# Patient Record
Sex: Male | Born: 1965 | Race: White | Hispanic: No | Marital: Married | Smoking: Former smoker
Health system: Southern US, Community
[De-identification: ages and names within clinical notes are randomized; demographics above are authoritative.]

## PROBLEM LIST (undated history)

## (undated) DIAGNOSIS — K219 Gastro-esophageal reflux disease without esophagitis: Secondary | ICD-10-CM

## (undated) DIAGNOSIS — E119 Type 2 diabetes mellitus without complications: Secondary | ICD-10-CM

## (undated) DIAGNOSIS — F329 Major depressive disorder, single episode, unspecified: Secondary | ICD-10-CM

## (undated) DIAGNOSIS — E079 Disorder of thyroid, unspecified: Secondary | ICD-10-CM

## (undated) DIAGNOSIS — N451 Epididymitis: Secondary | ICD-10-CM

## (undated) DIAGNOSIS — N529 Male erectile dysfunction, unspecified: Secondary | ICD-10-CM

## (undated) DIAGNOSIS — G43909 Migraine, unspecified, not intractable, without status migrainosus: Secondary | ICD-10-CM

## (undated) DIAGNOSIS — I4510 Unspecified right bundle-branch block: Secondary | ICD-10-CM

## (undated) DIAGNOSIS — G4730 Sleep apnea, unspecified: Secondary | ICD-10-CM

## (undated) DIAGNOSIS — F32A Depression, unspecified: Secondary | ICD-10-CM

## (undated) DIAGNOSIS — F419 Anxiety disorder, unspecified: Secondary | ICD-10-CM

## (undated) DIAGNOSIS — Z5189 Encounter for other specified aftercare: Secondary | ICD-10-CM

## (undated) DIAGNOSIS — M1990 Unspecified osteoarthritis, unspecified site: Secondary | ICD-10-CM

## (undated) DIAGNOSIS — G4733 Obstructive sleep apnea (adult) (pediatric): Secondary | ICD-10-CM

## (undated) DIAGNOSIS — E785 Hyperlipidemia, unspecified: Secondary | ICD-10-CM

## (undated) DIAGNOSIS — N411 Chronic prostatitis: Secondary | ICD-10-CM

## (undated) DIAGNOSIS — IMO0002 Reserved for concepts with insufficient information to code with codable children: Secondary | ICD-10-CM

## (undated) DIAGNOSIS — K5900 Constipation, unspecified: Secondary | ICD-10-CM

## (undated) HISTORY — DX: Anxiety disorder, unspecified: F41.9

## (undated) HISTORY — DX: Epididymitis: N45.1

## (undated) HISTORY — DX: Obstructive sleep apnea (adult) (pediatric): G47.33

## (undated) HISTORY — DX: Encounter for other specified aftercare: Z51.89

## (undated) HISTORY — PX: HERNIA REPAIR: SHX51

## (undated) HISTORY — DX: Major depressive disorder, single episode, unspecified: F32.9

## (undated) HISTORY — DX: Unspecified right bundle-branch block: I45.10

## (undated) HISTORY — DX: Disorder of thyroid, unspecified: E07.9

## (undated) HISTORY — DX: Type 2 diabetes mellitus without complications: E11.9

## (undated) HISTORY — DX: Gastro-esophageal reflux disease without esophagitis: K21.9

## (undated) HISTORY — DX: Constipation, unspecified: K59.00

## (undated) HISTORY — DX: Migraine, unspecified, not intractable, without status migrainosus: G43.909

## (undated) HISTORY — DX: Depression, unspecified: F32.A

## (undated) HISTORY — DX: Unspecified osteoarthritis, unspecified site: M19.90

## (undated) HISTORY — DX: Chronic prostatitis: N41.1

## (undated) HISTORY — DX: Sleep apnea, unspecified: G47.30

## (undated) HISTORY — DX: Hyperlipidemia, unspecified: E78.5

## (undated) HISTORY — DX: Reserved for concepts with insufficient information to code with codable children: IMO0002

## (undated) HISTORY — PX: ELBOW SURGERY: SHX618

## (undated) HISTORY — DX: Male erectile dysfunction, unspecified: N52.9

---

## 1999-01-31 HISTORY — PX: COLONOSCOPY: SHX174

## 2001-08-30 HISTORY — PX: UVULOPALATOPHARYNGOPLASTY: SHX827

## 2005-09-30 HISTORY — PX: SHOULDER ARTHROSCOPY: SHX128

## 2006-01-30 HISTORY — PX: SHOULDER ARTHROSCOPY: SHX128

## 2009-01-30 HISTORY — PX: UMBILICAL HERNIA REPAIR: SHX196

## 2009-02-10 ENCOUNTER — Ambulatory Visit (HOSPITAL_COMMUNITY): Admission: RE | Admit: 2009-02-10 | Discharge: 2009-02-10 | Payer: Self-pay | Admitting: Surgery

## 2009-03-22 ENCOUNTER — Emergency Department (HOSPITAL_COMMUNITY): Admission: EM | Admit: 2009-03-22 | Discharge: 2009-03-22 | Payer: Self-pay | Admitting: Emergency Medicine

## 2010-04-17 LAB — CBC
RBC: 4.95 MIL/uL (ref 4.22–5.81)
WBC: 6.6 10*3/uL (ref 4.0–10.5)

## 2010-04-17 LAB — COMPREHENSIVE METABOLIC PANEL
ALT: 32 U/L (ref 0–53)
AST: 24 U/L (ref 0–37)
Alkaline Phosphatase: 76 U/L (ref 39–117)
CO2: 28 mEq/L (ref 19–32)
Chloride: 102 mEq/L (ref 96–112)
GFR calc Af Amer: >60 mL/min (ref >60)
GFR calc non Af Amer: >60 mL/min (ref >60)
Sodium: 138 mEq/L (ref 135–145)
Total Bilirubin: 0.4 mg/dL (ref 0.3–1.2)

## 2010-04-17 LAB — DIFFERENTIAL
Basophils Absolute: 0 10*3/uL (ref 0.0–0.1)
Eosinophils Absolute: 0.1 10*3/uL (ref 0.0–0.7)
Eosinophils Relative: 1 % (ref 0–5)
Lymphs Abs: 2 10*3/uL (ref 0.7–4.0)

## 2010-09-01 ENCOUNTER — Encounter: Payer: Self-pay | Admitting: Family Medicine

## 2010-09-01 DIAGNOSIS — E785 Hyperlipidemia, unspecified: Secondary | ICD-10-CM | POA: Insufficient documentation

## 2010-09-01 DIAGNOSIS — N411 Chronic prostatitis: Secondary | ICD-10-CM | POA: Insufficient documentation

## 2010-09-01 DIAGNOSIS — K219 Gastro-esophageal reflux disease without esophagitis: Secondary | ICD-10-CM | POA: Insufficient documentation

## 2011-12-14 ENCOUNTER — Other Ambulatory Visit (HOSPITAL_COMMUNITY): Payer: Self-pay

## 2011-12-14 DIAGNOSIS — M25522 Pain in left elbow: Secondary | ICD-10-CM

## 2011-12-14 DIAGNOSIS — I059 Rheumatic mitral valve disease, unspecified: Secondary | ICD-10-CM

## 2011-12-14 DIAGNOSIS — G8929 Other chronic pain: Secondary | ICD-10-CM

## 2011-12-14 DIAGNOSIS — M7710 Lateral epicondylitis, unspecified elbow: Secondary | ICD-10-CM

## 2011-12-19 ENCOUNTER — Ambulatory Visit (HOSPITAL_COMMUNITY)
Admission: RE | Admit: 2011-12-19 | Discharge: 2011-12-19 | Disposition: A | Discharge status: Home / Self Care | Source: Ambulatory Visit | Attending: Cardiovascular Disease | Admitting: Cardiovascular Disease

## 2011-12-19 DIAGNOSIS — R011 Cardiac murmur, unspecified: Secondary | ICD-10-CM | POA: Insufficient documentation

## 2011-12-19 DIAGNOSIS — I059 Rheumatic mitral valve disease, unspecified: Secondary | ICD-10-CM

## 2011-12-19 NOTE — Progress Notes (Signed)
[facility]   2D echo completed 12/19/2011.   Cindy Danea Manter, RDCS   

## 2012-02-01 DIAGNOSIS — G4733 Obstructive sleep apnea (adult) (pediatric): Secondary | ICD-10-CM

## 2012-02-01 HISTORY — DX: Obstructive sleep apnea (adult) (pediatric): G47.33

## 2012-02-15 ENCOUNTER — Encounter: Payer: Self-pay | Admitting: *Deleted

## 2012-02-15 DIAGNOSIS — N529 Male erectile dysfunction, unspecified: Secondary | ICD-10-CM | POA: Insufficient documentation

## 2012-02-15 DIAGNOSIS — F419 Anxiety disorder, unspecified: Secondary | ICD-10-CM | POA: Insufficient documentation

## 2012-02-15 DIAGNOSIS — N451 Epididymitis: Secondary | ICD-10-CM | POA: Insufficient documentation

## 2012-02-15 DIAGNOSIS — G43909 Migraine, unspecified, not intractable, without status migrainosus: Secondary | ICD-10-CM | POA: Insufficient documentation

## 2012-02-15 DIAGNOSIS — G4733 Obstructive sleep apnea (adult) (pediatric): Secondary | ICD-10-CM | POA: Insufficient documentation

## 2012-05-01 ENCOUNTER — Telehealth: Payer: Self-pay | Admitting: Family Medicine

## 2012-05-01 MED ORDER — ESCITALOPRAM OXALATE 20 MG PO TABS: 20.0000 mg | ORAL_TABLET | Freq: Every day | ORAL | Status: DC

## 2012-05-01 NOTE — Telephone Encounter (Signed)
rx refilled.

## 2012-06-03 ENCOUNTER — Encounter: Payer: Self-pay | Admitting: Family Medicine

## 2012-06-03 ENCOUNTER — Ambulatory Visit (INDEPENDENT_AMBULATORY_CARE_PROVIDER_SITE_OTHER): Admitting: Family Medicine

## 2012-06-03 VITALS — BP 118/68 | HR 54 | Temp 97.8°F | Resp 16 | Wt 223.0 lb

## 2012-06-03 DIAGNOSIS — M25529 Pain in unspecified elbow: Secondary | ICD-10-CM

## 2012-06-03 DIAGNOSIS — M7712 Lateral epicondylitis, left elbow: Secondary | ICD-10-CM

## 2012-06-03 DIAGNOSIS — G8929 Other chronic pain: Secondary | ICD-10-CM

## 2012-06-03 DIAGNOSIS — M7710 Lateral epicondylitis, unspecified elbow: Secondary | ICD-10-CM

## 2012-06-03 MED ORDER — PREDNISONE 20 MG PO TABS: ORAL_TABLET | ORAL | Status: DC

## 2012-06-03 NOTE — Progress Notes (Signed)
Subjective:    Patient ID: Mike Henderson, male    DOB: 30-Jul-1965, 47 y.o.   MRN: [id number]  HPI  Patient presents with 4 months of pain in his left elbow. He saw his orthopedist in January. That time he was given a Cortizone injection around the left lateral epicondyles and was given instructions on how to use an elbow strap. He stated that this helped the pain for over a month. However the pain gradually returned and exquisitely worsened 3 weeks ago. He felt a pop while he was digging with a shovel. He now has extreme pain over the left lateral epicondyles with dorsiflexion of the wrist, gentle touch, or wiggling the fingers. He is also having numbness and tingling in his fourth and fifth finger on his left  Past Medical History  Diagnosis Date  . Hyperlipidemia   . OSA (obstructive sleep apnea)   . Depression   . ED (erectile dysfunction)   . Chronic prostatitis   . Epididymitis   . Migraines   . Anxiety     Panic attacks  . GERD (gastroesophageal reflux disease)    Current Outpatient Prescriptions on File Prior to Visit  Medication Sig Dispense Refill  . ALPRAZolam (XANAX) 0.5 MG tablet Take 0.5 mg by mouth at bedtime as needed.        Marland Kitchen escitalopram (LEXAPRO) 20 MG tablet Take 1 tablet (20 mg total) by mouth daily.  30 tablet  5  . lansoprazole (PREVACID) 30 MG capsule Take 30 mg by mouth daily.        Marland Kitchen losartan (COZAAR) 25 MG tablet Take 25 mg by mouth daily.      . simvastatin (ZOCOR) 20 MG tablet Take 20 mg by mouth at bedtime.        . vardenafil (LEVITRA) 20 MG tablet Take 20 mg by mouth daily as needed.         No current facility-administered medications on file prior to visit.   No Known Allergies History   Social History  . Marital Status: Single    Spouse Name: N/A    Number of Children: N/A  . Years of Education: N/A   Occupational History  . Not on file.   Social History Main Topics  . Smoking status: Never Smoker   . Smokeless tobacco: Not on  file  . Alcohol Use: 1.0 oz/week    2 drink(s) per week  . Drug Use: No  . Sexually Active: Not on file   Other Topics Concern  . Not on file   Social History Narrative  . No narrative on file    Review of Systems Review of systems is negative    Objective:   Physical Exam  Cardiovascular: Normal rate, regular rhythm and normal heart sounds.   Pulmonary/Chest: Effort normal and breath sounds normal. No respiratory distress. He has no wheezes.  Musculoskeletal:       Left elbow: He exhibits no swelling, no effusion and no laceration. Tenderness found. Lateral epicondyle tenderness noted. No radial head and no olecranon process tenderness noted.   he has extreme tenderness to palpation of the left lateral epicondyles. This is worsened with wrist dorsiflexion. It is worse with gripping objects with his left hand. It is worse with flexing and extending his PIP and DIP joints against resistance.        Assessment & Plan:  1. Epicondylitis, lateral (tennis elbow), left I am concerned about partial tear in the extensor tendon group.  Recommended  he continue wearing his elbow strap. Also prescribed prednisone 20 mg tablets. He is to take 3 tablets on day 1 and day 2. 2 tablets on day 3 and afebrile. 1 tablet on day 5 and a 6. - MR Elbow Left W Wo Contrast; Future  2. Elbow pain, chronic, left See problem #1. - MR Elbow Left W Wo Contrast; Future

## 2012-06-13 ENCOUNTER — Encounter: Payer: Self-pay | Admitting: Family Medicine

## 2012-06-13 NOTE — Progress Notes (Unsigned)
Patient ID: Mike Henderson, male   DOB: January 03, 1966, 47 y.o.   MRN: [id number] Pt has appt with [facility] on May 19 at 1045 for MRI of Left elbow, pt is aware

## 2012-06-21 ENCOUNTER — Other Ambulatory Visit: Payer: Self-pay | Admitting: Family Medicine

## 2012-06-21 DIAGNOSIS — M25522 Pain in left elbow: Secondary | ICD-10-CM

## 2012-06-21 DIAGNOSIS — M7710 Lateral epicondylitis, unspecified elbow: Secondary | ICD-10-CM

## 2012-06-21 DIAGNOSIS — G8929 Other chronic pain: Secondary | ICD-10-CM

## 2012-06-23 ENCOUNTER — Encounter: Payer: Self-pay | Admitting: *Deleted

## 2012-06-26 ENCOUNTER — Encounter: Payer: Self-pay | Admitting: Cardiovascular Disease

## 2012-06-27 ENCOUNTER — Ambulatory Visit (INDEPENDENT_AMBULATORY_CARE_PROVIDER_SITE_OTHER): Admitting: Cardiovascular Disease

## 2012-06-27 ENCOUNTER — Encounter: Payer: Self-pay | Admitting: Cardiovascular Disease

## 2012-06-27 VITALS — BP 122/70 | HR 72 | Ht 71.0 in | Wt 220.4 lb

## 2012-06-27 DIAGNOSIS — I4510 Unspecified right bundle-branch block: Secondary | ICD-10-CM

## 2012-06-27 DIAGNOSIS — E669 Obesity, unspecified: Secondary | ICD-10-CM

## 2012-06-27 DIAGNOSIS — G4733 Obstructive sleep apnea (adult) (pediatric): Secondary | ICD-10-CM

## 2012-06-27 DIAGNOSIS — E785 Hyperlipidemia, unspecified: Secondary | ICD-10-CM

## 2012-06-27 NOTE — Patient Instructions (Addendum)
Your physician recommends that you schedule a follow-up appointment in as needed for your sleep theraphy.

## 2012-06-28 ENCOUNTER — Encounter: Payer: Self-pay | Admitting: Cardiovascular Disease

## 2012-06-28 DIAGNOSIS — E669 Obesity, unspecified: Secondary | ICD-10-CM | POA: Insufficient documentation

## 2012-06-28 DIAGNOSIS — I4510 Unspecified right bundle-branch block: Secondary | ICD-10-CM | POA: Insufficient documentation

## 2012-06-28 NOTE — Progress Notes (Signed)
Patient ID: Mike Henderson, male   DOB: Mar 16, 1965, 47 y.o.   MRN: [id number] HPI: Mike Henderson, is a 47 y.o. male who presents to the office today for sleep clinic evaluation following initiation of CPAP therapy. Mike Henderson is a 47 year old Mike Henderson is retired from the [employer]. Apparently in 2003 while in [place] he was diagnosed as having obstructive sleep apnea he underwent a uvulopalatal pharyngeal plasty  after he was not able to she addition of CPAP therapy. He states this did improve his sleep pattern. In 2007 he had another sleep study done at the [facility] but apparently he was never notified of  the results and was told that the case was closed and no treatment was necessary. Recently, the patient has noticed progressive sleep issues: his sleep has become disruptive, he's noted frequent urination, morning and migraine headaches, increased blood pressure as well as fatigue. Result, he was referred for a sleep study on 02/01/2012. At that time, his Epworth scale endorsed 17 and his Beck's inventory score endorsed at 28. He was found to have mild sleep apnea overall with an AHI of 7.6 per hour and an RDI of 10.5 per air. However, during sleep, sleep apnea was severe with an HI of 37.3 per hour. He subsequently underwent a CPAP titration trial on 03/07/2012 and was titrated up to 12 cm water pressure with excellent response. He has been on CPAP therapy since 04/18/2012 and a download inclusive of 06/16/2012 revealed excellent usage having used at 59/60 days, averaging  7 hours and 28 minutes per night at a 12 cm set pressure. His AHI is now excellent at 0.5. He presents now for evaluation. If additional problems also include hypertension, hyperlipidemia appear review of his chart the laboratory in July 2012 did suggest atherogenic dyslipidemia panel with a total cholesterol of 185 but markedly elevated triglycerides at 318, increased VLDL at 64 and low HDL levels at 38 and even though his LDL  was 83 he tells me he recently had laboratory done by Mike Henderson and was told that his labs were "okay."  Epworth Sleepiness Scale: Situation   Chance of Dozing/Sleeping (0 = never , 1 = slight chance , 2 = moderate chance , 3 = high chance )   sitting and reading 1   watching TV 0   sitting inactive in a public place 1   being a passenger in a motor vehicle for an hour or more 2   lying down in the afternoon 3   sitting and talking to someone 0   sitting quietly after lunch (no alcohol) 1   while stopped for a few minutes in traffic as the driver 0   Total Score  8    Past Medical History  Diagnosis Date  . Hyperlipidemia   . OSA (obstructive sleep apnea) 02/01/12    SLEEP STUDY [facility]  . Depression   . ED (erectile dysfunction)   . Chronic prostatitis   . Epididymitis   . Migraines   . Anxiety     Panic attacks  . GERD (gastroesophageal reflux disease)   . RBBB     Past Surgical History  Procedure Laterality Date  . Uvulopalatopharyngoplasty  08/2001  . Shoulder arthroscopy  09/2005    left  . Shoulder arthroscopy  01/2006    right  . Umbilical hernia repair  01/2009    No Known Allergies  Current Outpatient Prescriptions  Medication Sig Dispense Refill  .  ALPRAZolam (XANAX) 0.5 MG tablet Take 0.5 mg by mouth at bedtime as needed.        Marland Kitchen escitalopram (LEXAPRO) 20 MG tablet Take 1 tablet (20 mg total) by mouth daily.  30 tablet  5  . lansoprazole (PREVACID) 30 MG capsule Take 30 mg by mouth daily.        Marland Kitchen losartan (COZAAR) 25 MG tablet Take 25 mg by mouth daily.      . Multiple Vitamin (MULTIVITAMIN) tablet Take 1 tablet by mouth daily.      . simvastatin (ZOCOR) 20 MG tablet Take 20 mg by mouth at bedtime.        Marland Kitchen UNABLE TO FIND Med Name:CPAP THERAPHY      . vardenafil (LEVITRA) 20 MG tablet Take 20 mg by mouth daily as needed.         No current facility-administered medications for this visit.    SOCHX is notable in that he was  married early and divorced at 56. [employer] for 22 years. There is no tobacco use. He works for airway [employer] and remains active with his work.  ROS is negative for fever chills night sweats. He denies any significant recent weight loss but did admit to some weight gain following evening the [employer]. He has been wearing his CPAP mask difficulty. Of note, there is family history for sleep apnea with his father also having undergone a UPPP procedure. He notes marked improvement in his previous migraines. He also notes improvement in his blood pressure as well as fatigability. He denies a significant residual daytime sleepiness. His nocturnal urination has improved. His blood pressure has improved with CPAP treatment. He denies chest pressure. He denies nocturnal tachycardia palpitations. He denies significant edema. Other system review is negative.  PE BP 122/70  Pulse 72  Ht 5\' 11"  (1.803 m)  Wt 220 lb 6.4 oz (99.973 kg)  BMI 30.75 kg/m2  General: Alert, oriented, no distress.  HEENT: Normocephalic, atraumatic. Pupils round and reactive; sclera anicteric; Fundi without hemorrhages or exudates. Nose without nasal septal hypertrophy Mouth/Parynx benign; he is status post UPPP procedure Neck: No JVD, no carotid briuts Lungs: clear to ausculatation and percussion; no wheezing or rales Heart: RRR, s1 s2 normal with a systolic murmur Abdomen: Mildly obese with central adiposity. soft, nontender; no hepatosplenomehaly, BS+; abdominal aorta nontender and not dilated by palpation. Pulses 2+ Extremities: no clubbinbg cyanosis or edema, Homan's sign negative  Neurologic: grossly nonfocal    LABS:  BMET    Component Value Date/Time   NA 138 02/08/2009 1410   K 3.3* 02/08/2009 1410   CL 102 02/08/2009 1410   CO2 28 02/08/2009 1410   GLUCOSE 104* 02/08/2009 1410   BUN 10 02/08/2009 1410   CREATININE 0.79 02/08/2009 1410   CALCIUM 9.3 02/08/2009 1410   GFRNONAA >60 02/08/2009 1410   GFRAA  Value: >60         The eGFR has been calculated using the MDRD equation. This calculation has not been validated in all clinical situations. eGFR's persistently <60 mL/min signify possible Chronic Kidney Disease. 02/08/2009 1410     Hepatic Function Panel     Component Value Date/Time   PROT 7.6 02/08/2009 1410   ALBUMIN 4.2 02/08/2009 1410   AST 24 02/08/2009 1410   ALT 32 02/08/2009 1410   ALKPHOS 76 02/08/2009 1410   BILITOT 0.4 02/08/2009 1410     CBC    Component Value Date/Time   WBC 6.6 02/08/2009 1410  RBC 4.95 02/08/2009 1410   HGB 15.9 02/08/2009 1410   HCT 45.9 02/08/2009 1410   PLT 266 02/08/2009 1410   MCV 92.7 02/08/2009 1410   MCHC 34.6 02/08/2009 1410   RDW 12.4 02/08/2009 1410   LYMPHSABS 2.0 02/08/2009 1410   MONOABS 0.6 02/08/2009 1410   EOSABS 0.1 02/08/2009 1410   BASOSABS 0.0 02/08/2009 1410     BNP No results found for this basename: probnp    Lipid Panel  No results found for this basename: chol, trig, hdl, cholhdl, vldl, ldlcalc     RADIOLOGY: No results found.    ASSESSMENT AND PLAN: Resume, Mr. Anastasi is doing well with reference to his resumption of CPAP therapy for obstructive sleep apnea. He has noticed marked improvement in symptoms with resolution of significant urinary frequency, improvement in headaches as well his blood pressure response. His download since except inception of CPAP shows an excellent response within AHI now of 0.5. His Epworth Sleepiness Scale score has markedly improved from 17 to 8. I discussed with him the importance of additional weight loss and exercise prescription. I also discussed with him the lipid panel that you had done her bleeders ago which raises the possibility of metabolic syndrome, and it was an atherogenic dyslipidemia profile. I suggested that the next time he sees Mike Henderson and NMR lipoprotein albeit obtained to assess particle  size as well as particle number also this will be helpful to assess his insulin resistance score.  Discussed lifestyle modification adjustments ominously perspective, he is doing well with resumption of CPAP therapy. I will be available to see him on an as needed basis if problems arise.     Lennette Bihari, MD, [facility]  06/28/2012 9:03 AM

## 2012-07-09 ENCOUNTER — Other Ambulatory Visit: Payer: Self-pay | Admitting: Family Medicine

## 2012-09-24 ENCOUNTER — Other Ambulatory Visit: Payer: Self-pay | Admitting: Family Medicine

## 2012-10-01 ENCOUNTER — Ambulatory Visit (INDEPENDENT_AMBULATORY_CARE_PROVIDER_SITE_OTHER): Admitting: Family Medicine

## 2012-10-01 ENCOUNTER — Encounter: Payer: Self-pay | Admitting: Family Medicine

## 2012-10-01 VITALS — BP 116/60 | HR 82 | Temp 97.6°F | Resp 20 | Wt 219.0 lb

## 2012-10-01 DIAGNOSIS — M62830 Muscle spasm of back: Secondary | ICD-10-CM

## 2012-10-01 DIAGNOSIS — M5380 Other specified dorsopathies, site unspecified: Secondary | ICD-10-CM

## 2012-10-01 MED ORDER — ALPRAZOLAM 0.5 MG PO TABS: 0.5000 mg | ORAL_TABLET | Freq: Every evening | ORAL | Status: DC | PRN

## 2012-10-01 MED ORDER — CYCLOBENZAPRINE HCL 10 MG PO TABS: 10.0000 mg | ORAL_TABLET | Freq: Every evening | ORAL | Status: DC | PRN

## 2012-10-01 MED ORDER — TRAMADOL HCL 50 MG PO TABS: 50.0000 mg | ORAL_TABLET | Freq: Four times a day (QID) | ORAL | Status: DC | PRN

## 2012-10-01 NOTE — Progress Notes (Signed)
  Subjective:    Patient ID: Mike Henderson, male    DOB: 1965-11-06, 47 y.o.   MRN: [id number]  HPI  Back pain for past few weeks, no injury. He has pinpoint tenderness on the right lower aspect of his back. States that his spine is nontender. 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 He also lifts weights but does not remember any injury with exercise. He denies any radiation of pain denies any change in bowel or bladder denies any appears seizures in his lower extremities. He has not used any over-the-counter medications he does have history of peptic ulcer and severe GERD therefore does not take anti-inflammatories.  Asking for refill on his xanax, uses at bedtime for his GAD  Review of Systems  GEN- denies fatigue, fever, weight loss,weakness, recent illness GU- denies dysuria, hematuria, dribbling, incontinence MSK- + joint pain, muscle aches, injury Neuro- denies headache, dizziness, syncope, seizure activity      Objective:   Physical Exam  GEN-NAD, alert and oriented x 3 GU-neg CVA tenderness MSK- Spine NT, TTP with spasm right paraspinals, neg SLR, FROM HIP bilat, FROM back Neuro- normal tone, sensation intact, motor intact  And equal bilat LE, normal Gait      Assessment & Plan:

## 2012-10-01 NOTE — Patient Instructions (Signed)
Try heating pad or salon patch Use muscle relaxer Take ultram as needed If not improved after a week, call back, xray will be done Xanax refilled F/U as needed

## 2012-10-02 DIAGNOSIS — M62830 Muscle spasm of back: Secondary | ICD-10-CM | POA: Insufficient documentation

## 2012-10-02 NOTE — Assessment & Plan Note (Signed)
I think this is more back spasms. I will put him on Flexeril at bedtime. He also has Ultram for severe pain. Unfortunately we cannot use any anti-inflammatories due to his GI history. If he does not improve or his pain worsens then I will obtain plain x-ray of L-spine consider physical therapy

## 2012-10-30 ENCOUNTER — Encounter: Payer: Self-pay | Admitting: Family Medicine

## 2012-11-06 ENCOUNTER — Telehealth: Payer: Self-pay | Admitting: Cardiovascular Disease

## 2012-11-06 NOTE — Telephone Encounter (Signed)
Mailed patient records to home address 11/06/12. ST

## 2012-11-21 ENCOUNTER — Other Ambulatory Visit: Payer: Self-pay | Admitting: Family Medicine

## 2012-11-21 MED ORDER — LOSARTAN POTASSIUM 25 MG PO TABS: 25.0000 mg | ORAL_TABLET | Freq: Every day | ORAL | Status: DC

## 2012-11-21 NOTE — Telephone Encounter (Signed)
Meds refilled.

## 2012-12-05 ENCOUNTER — Other Ambulatory Visit: Payer: Self-pay

## 2012-12-23 ENCOUNTER — Other Ambulatory Visit: Payer: Self-pay | Admitting: Family Medicine

## 2012-12-24 ENCOUNTER — Encounter: Payer: Self-pay | Admitting: Family Medicine

## 2013-01-08 ENCOUNTER — Other Ambulatory Visit

## 2013-01-08 ENCOUNTER — Other Ambulatory Visit: Payer: Self-pay | Admitting: Family Medicine

## 2013-01-08 DIAGNOSIS — Z0000 Encounter for general adult medical examination without abnormal findings: Secondary | ICD-10-CM

## 2013-01-08 DIAGNOSIS — E785 Hyperlipidemia, unspecified: Secondary | ICD-10-CM

## 2013-01-08 DIAGNOSIS — Z79899 Other long term (current) drug therapy: Secondary | ICD-10-CM

## 2013-01-08 LAB — CBC WITH DIFFERENTIAL/PLATELET
Basophils Absolute: 0.1 10*3/uL (ref 0.0–0.1)
Eosinophils Relative: 2 % (ref 0–5)
Lymphocytes Relative: 32 % (ref 12–46)
MCV: 88.4 fL (ref 78.0–100.0)
Platelets: 274 10*3/uL (ref 150–400)
RDW: 13.3 % (ref 11.5–15.5)
WBC: 5.5 10*3/uL (ref 4.0–10.5)

## 2013-01-08 LAB — LIPID PANEL
LDL Cholesterol: 112 mg/dL — ABNORMAL HIGH (ref 0–99)
Total CHOL/HDL Ratio: 3.9 Ratio
VLDL: 29 mg/dL (ref 0–40)

## 2013-01-08 LAB — COMPREHENSIVE METABOLIC PANEL
ALT: 36 U/L (ref 0–53)
AST: 22 U/L (ref 0–37)
Alkaline Phosphatase: 83 U/L (ref 39–117)
Calcium: 9.9 mg/dL (ref 8.4–10.5)
Chloride: 102 mEq/L (ref 96–112)
Creat: 0.76 mg/dL (ref 0.50–1.35)
Total Bilirubin: 0.4 mg/dL (ref 0.3–1.2)

## 2013-01-08 LAB — TSH: TSH: 4.011 u[IU]/mL (ref 0.350–4.500)

## 2013-01-10 LAB — HEMOGLOBIN A1C: Hgb A1c MFr Bld: 5.5 % (ref <5.7)

## 2013-01-13 ENCOUNTER — Ambulatory Visit (INDEPENDENT_AMBULATORY_CARE_PROVIDER_SITE_OTHER): Admitting: Family Medicine

## 2013-01-13 ENCOUNTER — Encounter: Payer: Self-pay | Admitting: Family Medicine

## 2013-01-13 VITALS — BP 110/70 | HR 80 | Temp 97.4°F | Resp 16 | Ht 71.0 in | Wt 230.0 lb

## 2013-01-13 DIAGNOSIS — R7309 Other abnormal glucose: Secondary | ICD-10-CM

## 2013-01-13 DIAGNOSIS — I10 Essential (primary) hypertension: Secondary | ICD-10-CM

## 2013-01-13 DIAGNOSIS — E785 Hyperlipidemia, unspecified: Secondary | ICD-10-CM

## 2013-01-13 DIAGNOSIS — R739 Hyperglycemia, unspecified: Secondary | ICD-10-CM

## 2013-01-13 NOTE — Progress Notes (Signed)
Subjective:    Patient ID: Mike Henderson, male    DOB: 09-02-1965, 47 y.o.   MRN: [id number]  HPI Patient comes back for follow up of his hypertension and hyperlipidemia. He is currently taking losartan for hypertension. His blood pressure is 110/70. He is being very compliant with CPAP therapy. He wears it every night for his obstructive sleep apnea. Result pressure is much improved to 110/70 however does report some fatigue. He denies any chest pain shortness of breath or dyspnea on exertion. He is also taking simvastatin 20 mg by mouth daily. He denies any myalgia right quadrant pain. His most recent labwork as listed below. His cholesterol is acceptable, his fasting blood sugars are 124. I obtained a hemoglobin A1c which confirms a level of 5.5. Lab on 01/08/2013  Component Date Value Range Status  . WBC 01/08/2013 5.5  4.0 - 10.5 K/uL Final  . RBC 01/08/2013 5.00  4.22 - 5.81 MIL/uL Final  . Hemoglobin 01/08/2013 15.6  13.0 - 17.0 g/dL Final  . HCT 41/32/4401 44.2  39.0 - 52.0 % Final  . MCV 01/08/2013 88.4  78.0 - 100.0 fL Final  . MCH 01/08/2013 31.2  26.0 - 34.0 pg Final  . MCHC 01/08/2013 35.3  30.0 - 36.0 g/dL Final  . RDW 02/72/5366 13.3  11.5 - 15.5 % Final  . Platelets 01/08/2013 274  150 - 400 K/uL Final  . Neutrophils Relative % 01/08/2013 56  43 - 77 % Final  . Neutro Abs 01/08/2013 3.1  1.7 - 7.7 K/uL Final  . Lymphocytes Relative 01/08/2013 32  12 - 46 % Final  . Lymphs Abs 01/08/2013 1.8  0.7 - 4.0 K/uL Final  . Monocytes Relative 01/08/2013 9  3 - 12 % Final  . Monocytes Absolute 01/08/2013 0.5  0.1 - 1.0 K/uL Final  . Eosinophils Relative 01/08/2013 2  0 - 5 % Final  . Eosinophils Absolute 01/08/2013 0.1  0.0 - 0.7 K/uL Final  . Basophils Relative 01/08/2013 1  0 - 1 % Final  . Basophils Absolute 01/08/2013 0.1  0.0 - 0.1 K/uL Final  . Smear Review 01/08/2013 Criteria for review not met   Final  . Sodium 01/08/2013 137  135 - 145 mEq/L Final  . Potassium  01/08/2013 4.7  3.5 - 5.3 mEq/L Final  . Chloride 01/08/2013 102  96 - 112 mEq/L Final  . CO2 01/08/2013 28  19 - 32 mEq/L Final  . Glucose, Bld 01/08/2013 124* 70 - 99 mg/dL Final  . BUN 44/03/4740 12  6 - 23 mg/dL Final  . Creat 59/56/3875 0.76  0.50 - 1.35 mg/dL Final  . Total Bilirubin 01/08/2013 0.4  0.3 - 1.2 mg/dL Final  . Alkaline Phosphatase 01/08/2013 83  39 - 117 U/L Final  . AST 01/08/2013 22  0 - 37 U/L Final  . ALT 01/08/2013 36  0 - 53 U/L Final  . Total Protein 01/08/2013 7.0  6.0 - 8.3 g/dL Final  . Albumin 64/33/2951 4.6  3.5 - 5.2 g/dL Final  . Calcium 88/41/6606 9.9  8.4 - 10.5 mg/dL Final  . Cholesterol 30/16/0109 189  0 - 200 mg/dL Final   Comment: ATP III Classification:                                < 200        mg/dL        Desirable  200 - 239     mg/dL        Borderline High                               >= 240        mg/dL        High                             . Triglycerides 01/08/2013 146  <150 mg/dL Final  . HDL 16/10/9602 48  >39 mg/dL Final  . Total CHOL/HDL Ratio 01/08/2013 3.9   Final  . VLDL 01/08/2013 29  0 - 40 mg/dL Final  . LDL Cholesterol 01/08/2013 112* 0 - 99 mg/dL Final   Comment:                            Total Cholesterol/HDL Ratio:CHD Risk                                                 Coronary Heart Disease Risk Table                                                                 Men       Women                                   1/2 Average Risk              3.4        3.3                                       Average Risk              5.0        4.4                                    2X Average Risk              9.6        7.1                                    3X Average Risk             23.4       11.0                          Use the calculated Patient Ratio above and the CHD Risk table  to determine the patient's CHD Risk.                          ATP III Classification  (LDL):                                < 100        mg/dL         Optimal                               100 - 129     mg/dL         Near or Above Optimal                               130 - 159     mg/dL         Borderline High                               160 - 189     mg/dL         High                                > 190        mg/dL         Very High                             . TSH 01/08/2013 4.011  0.350 - 4.500 uIU/mL Final  Orders Only on 01/08/2013  Component Date Value Range Status  . Hemoglobin A1C 01/08/2013 5.5  <5.7 % Final   Comment:                                                                                                 According to the ADA Clinical Practice Recommendations for 2011, when                          HbA1c is used as a screening test:                                                       >=6.5%   Diagnostic of Diabetes Mellitus                                     (if abnormal result is confirmed)  5.7-6.4%   Increased risk of developing Diabetes Mellitus                                                     References:Diagnosis and Classification of Diabetes Mellitus,Diabetes                          Care,2011,34(Suppl 1):S62-S69 and Standards of Medical Care in                                  Diabetes - 2011,Diabetes Care,2011,34 (Suppl 1):S11-S61.                             . Mean Plasma Glucose 01/08/2013 111  <117 mg/dL Final   Past Medical History  Diagnosis Date  . Hyperlipidemia   . OSA (obstructive sleep apnea) 02/01/12    SLEEP STUDY [facility]  . Depression   . ED (erectile dysfunction)   . Chronic prostatitis   . Epididymitis   . Migraines   . Anxiety     Panic attacks  . GERD (gastroesophageal reflux disease)   . RBBB    Current Outpatient Prescriptions on File Prior to Visit  Medication Sig Dispense Refill  . ALPRAZolam (XANAX) 0.5 MG tablet Take 1  tablet (0.5 mg total) by mouth at bedtime as needed.  30 tablet  2  . cyclobenzaprine (FLEXERIL) 10 MG tablet Take 1 tablet (10 mg total) by mouth at bedtime as needed for muscle spasms.  30 tablet  1  . escitalopram (LEXAPRO) 20 MG tablet TAKE 1 TABLET BY MOUTH DAILY  30 tablet  5  . lansoprazole (PREVACID) 30 MG capsule Take 30 mg by mouth daily.        Marland Kitchen losartan (COZAAR) 25 MG tablet Take 1 tablet (25 mg total) by mouth daily.  30 tablet  2  . Multiple Vitamin (MULTIVITAMIN) tablet Take 1 tablet by mouth daily.      . simvastatin (ZOCOR) 20 MG tablet TAKE 1 TABLET BY MOUTH AT BEDTIME  30 tablet  0  . UNABLE TO FIND Med Name:CPAP THERAPHY      . vardenafil (LEVITRA) 20 MG tablet Take 20 mg by mouth daily as needed.         No current facility-administered medications on file prior to visit.   No Known Allergies History   Social History  . Marital Status: Single    Spouse Name: N/A    Number of Children: N/A  . Years of Education: N/A   Occupational History  . Not on file.   Social History Main Topics  . Smoking status: Never Smoker   . Smokeless tobacco: Not on file  . Alcohol Use: 1.0 oz/week    2 drink(s) per week  . Drug Use: No  . Sexual Activity: Not on file   Other Topics Concern  . Not on file   Social History Narrative  . No narrative on file      Review of Systems  All other systems reviewed and are negative.       Objective:   Physical Exam  Vitals reviewed. Constitutional: He appears well-developed and well-nourished.  Cardiovascular:  Normal rate, regular rhythm, normal heart sounds and intact distal pulses.  Exam reveals no gallop and no friction rub.   No murmur heard. Pulmonary/Chest: Effort normal and breath sounds normal. No respiratory distress. He has no wheezes. He has no rales. He exhibits no tenderness.  Abdominal: Soft. Bowel sounds are normal. He exhibits no distension and no mass. There is no tenderness. There is no rebound and no  guarding.  Musculoskeletal: He exhibits no edema.          Assessment & Plan:  1. Other and unspecified hyperlipidemia Cholesterol is acceptable, continue Zocor 20 mg by mouth daily  2. Hyperglycemia Hemoglobin A1c is normal. I have asked the patient to consume a low carbohydrate diet, and increase aerobic exercise. I would like to recheck a hemoglobin A1c as well as a fasting blood sugar in 3 months  3. HTN (hypertension) Have asked the patient to check his blood pressure daily for the next 2 weeks and report to me the values. If they are all low we may need to discontinue or decrease his losartan due to his fatigue.

## 2013-02-05 ENCOUNTER — Telehealth: Payer: Self-pay | Admitting: *Deleted

## 2013-02-05 NOTE — Telephone Encounter (Signed)
Pt called with BP readings, he took them three different days at three different times they are as follows:  140/80- p-71 134/77-p- 70 132/71-p- 53

## 2013-02-06 NOTE — Telephone Encounter (Signed)
Pt aware.

## 2013-02-06 NOTE — Telephone Encounter (Signed)
Those bp's are acceptable, no further changes

## 2013-02-09 ENCOUNTER — Other Ambulatory Visit: Payer: Self-pay | Admitting: Family Medicine

## 2013-02-10 NOTE — Telephone Encounter (Signed)
Ok to refill 

## 2013-02-11 NOTE — Telephone Encounter (Signed)
Ok to refill 

## 2013-02-13 NOTE — Telephone Encounter (Signed)
ok 

## 2013-02-14 NOTE — Telephone Encounter (Signed)
RX called in .

## 2013-03-11 ENCOUNTER — Other Ambulatory Visit: Payer: Self-pay | Admitting: Family Medicine

## 2013-04-10 ENCOUNTER — Other Ambulatory Visit: Payer: Self-pay | Admitting: Family Medicine

## 2013-04-10 ENCOUNTER — Other Ambulatory Visit

## 2013-04-10 DIAGNOSIS — I10 Essential (primary) hypertension: Secondary | ICD-10-CM

## 2013-04-10 DIAGNOSIS — Z79899 Other long term (current) drug therapy: Secondary | ICD-10-CM

## 2013-04-10 DIAGNOSIS — E785 Hyperlipidemia, unspecified: Secondary | ICD-10-CM

## 2013-04-10 LAB — COMPREHENSIVE METABOLIC PANEL
ALBUMIN: 4.3 g/dL (ref 3.5–5.2)
ALT: 21 U/L (ref 0–53)
AST: 15 U/L (ref 0–37)
Alkaline Phosphatase: 72 U/L (ref 39–117)
BUN: 11 mg/dL (ref 6–23)
CALCIUM: 9 mg/dL (ref 8.4–10.5)
CHLORIDE: 104 meq/L (ref 96–112)
CO2: 26 meq/L (ref 19–32)
Creat: 0.76 mg/dL (ref 0.50–1.35)
GLUCOSE: 115 mg/dL — AB (ref 70–99)
POTASSIUM: 4.3 meq/L (ref 3.5–5.3)
Sodium: 140 mEq/L (ref 135–145)
Total Bilirubin: 0.5 mg/dL (ref 0.2–1.2)
Total Protein: 6.7 g/dL (ref 6.0–8.3)

## 2013-04-10 LAB — CBC WITH DIFFERENTIAL/PLATELET
BASOS ABS: 0.1 10*3/uL (ref 0.0–0.1)
Basophils Relative: 1 % (ref 0–1)
Eosinophils Absolute: 0.1 10*3/uL (ref 0.0–0.7)
Eosinophils Relative: 2 % (ref 0–5)
HEMATOCRIT: 43 % (ref 39.0–52.0)
HEMOGLOBIN: 14.9 g/dL (ref 13.0–17.0)
LYMPHS ABS: 1.8 10*3/uL (ref 0.7–4.0)
LYMPHS PCT: 30 % (ref 12–46)
MCH: 30.7 pg (ref 26.0–34.0)
MCHC: 34.7 g/dL (ref 30.0–36.0)
MCV: 88.7 fL (ref 78.0–100.0)
MONO ABS: 0.6 10*3/uL (ref 0.1–1.0)
MONOS PCT: 10 % (ref 3–12)
NEUTROS ABS: 3.4 10*3/uL (ref 1.7–7.7)
Neutrophils Relative %: 57 % (ref 43–77)
Platelets: 250 10*3/uL (ref 150–400)
RBC: 4.85 MIL/uL (ref 4.22–5.81)
RDW: 13.4 % (ref 11.5–15.5)
WBC: 5.9 10*3/uL (ref 4.0–10.5)

## 2013-04-10 LAB — LIPID PANEL
Cholesterol: 171 mg/dL (ref 0–200)
HDL: 40 mg/dL (ref >39)
LDL Cholesterol: 116 mg/dL — ABNORMAL HIGH (ref 0–99)
TRIGLYCERIDES: 74 mg/dL (ref <150)
Total CHOL/HDL Ratio: 4.3 Ratio
VLDL: 15 mg/dL (ref 0–40)

## 2013-04-11 LAB — HEMOGLOBIN A1C
Hgb A1c MFr Bld: 5.9 % — ABNORMAL HIGH (ref <5.7)
Mean Plasma Glucose: 123 mg/dL — ABNORMAL HIGH (ref <117)

## 2013-04-16 ENCOUNTER — Encounter: Payer: Self-pay | Admitting: Family Medicine

## 2013-05-01 ENCOUNTER — Other Ambulatory Visit: Payer: Self-pay | Admitting: Family Medicine

## 2013-05-26 ENCOUNTER — Other Ambulatory Visit: Payer: Self-pay | Admitting: Family Medicine

## 2013-07-07 ENCOUNTER — Other Ambulatory Visit: Payer: Self-pay | Admitting: Family Medicine

## 2013-08-21 ENCOUNTER — Other Ambulatory Visit: Payer: Self-pay | Admitting: Family Medicine

## 2013-08-21 NOTE — Telephone Encounter (Signed)
ok 

## 2013-08-21 NOTE — Telephone Encounter (Signed)
Medication called to pharmacy. 

## 2013-08-21 NOTE — Telephone Encounter (Signed)
Ok to refill??  Last office visit 01/13/2013.  Last refill 02/14/2013, #2 refills.

## 2013-09-24 ENCOUNTER — Other Ambulatory Visit: Payer: Self-pay | Admitting: Family Medicine

## 2013-10-12 ENCOUNTER — Other Ambulatory Visit: Payer: Self-pay | Admitting: Family Medicine

## 2013-10-13 NOTE — Telephone Encounter (Signed)
Medication called to pharmacy. 

## 2013-10-13 NOTE — Telephone Encounter (Signed)
ok 

## 2013-10-13 NOTE — Telephone Encounter (Signed)
Ok to refill??  Last office visit 01/13/2013.  Last refill 08/21/2013.

## 2013-11-12 ENCOUNTER — Other Ambulatory Visit: Payer: Self-pay | Admitting: Family Medicine

## 2013-11-23 ENCOUNTER — Other Ambulatory Visit: Payer: Self-pay | Admitting: Family Medicine

## 2014-01-01 ENCOUNTER — Other Ambulatory Visit: Payer: Self-pay | Admitting: Family Medicine

## 2014-01-01 NOTE — Telephone Encounter (Signed)
ok 

## 2014-01-01 NOTE — Telephone Encounter (Signed)
Medication called to pharmacy. 

## 2014-01-01 NOTE — Telephone Encounter (Signed)
Ok to refill??  Last office visit 01/13/2013.  Last refill 10/13/2013.

## 2014-02-02 ENCOUNTER — Other Ambulatory Visit: Payer: Self-pay | Admitting: Family Medicine

## 2014-02-02 NOTE — Telephone Encounter (Signed)
Ok to refill??  Last office visit 01/13/2014.  Last refill 01/01/2014.

## 2014-02-02 NOTE — Telephone Encounter (Signed)
Medication called to pharmacy. 

## 2014-02-02 NOTE — Telephone Encounter (Signed)
ok 

## 2014-02-03 ENCOUNTER — Ambulatory Visit (INDEPENDENT_AMBULATORY_CARE_PROVIDER_SITE_OTHER): Admitting: Family Medicine

## 2014-02-03 ENCOUNTER — Encounter: Payer: Self-pay | Admitting: Family Medicine

## 2014-02-03 VITALS — BP 124/76 | HR 60 | Temp 97.5°F | Resp 18 | Wt 237.0 lb

## 2014-02-03 DIAGNOSIS — M7711 Lateral epicondylitis, right elbow: Secondary | ICD-10-CM

## 2014-02-03 DIAGNOSIS — F32A Depression, unspecified: Secondary | ICD-10-CM

## 2014-02-03 DIAGNOSIS — F329 Major depressive disorder, single episode, unspecified: Secondary | ICD-10-CM

## 2014-02-03 DIAGNOSIS — R5382 Chronic fatigue, unspecified: Secondary | ICD-10-CM

## 2014-02-03 NOTE — Progress Notes (Signed)
Subjective:    Patient ID: Mike Henderson, male    DOB: 1965/12/20, 49 y.o.   MRN: [id number]  HPI Patient has a history of GAD and depression.  He has been on zoloft and effexor in the past.  He has been on lexapro 20 mg poqday for several years and questions if he needs the medication any longer.  His stress level is well controlled. He is in a good supportive marriage. 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 He does use Xanax 2-3 times a week to help him sleep or for generalized anxiety but he would like to come off the Lexapro at least wean down on it to see if this will help with his erectile dysfunction, and severe fatigue. He is currently treating his obstructive sleep apnea. Thyroid was checked last year and was normal. He has not had a testosterone level checked in the last 2 years.  He also complains of pain in his right elbow near the lateral epicondyles for several weeks. The pain is worse with resisted dorsiflexion of the wrist and with hand gripping. He is tender to palpation over the right lateral epicondyles. Past Medical History  Diagnosis Date  . Hyperlipidemia   . OSA (obstructive sleep apnea) 02/01/12    SLEEP STUDY [facility]  . Depression   . ED (erectile dysfunction)   . Chronic prostatitis   . Epididymitis   . Migraines   . Anxiety     Panic attacks  . GERD (gastroesophageal reflux disease)   . RBBB    Past Surgical History  Procedure Laterality Date  . Uvulopalatopharyngoplasty  08/2001  . Shoulder arthroscopy  09/2005    left  . Shoulder arthroscopy  01/2006    right  . Umbilical hernia repair  01/2009   Current Outpatient Prescriptions on File Prior to Visit  Medication Sig Dispense Refill  . ALPRAZolam (XANAX) 0.5 MG tablet TAKE 1 TABLET BY MOUTH AT BEDTIME AS NEEDED FOR SLEEP 30 tablet 0  . escitalopram (LEXAPRO) 20 MG tablet TAKE 1 TABLET BY MOUTH DAILY 30 tablet 5  . lansoprazole (PREVACID) 30 MG capsule TAKE 1 CAPSULE DAILY BEFORE A  MEAL 30 capsule 11  . losartan (COZAAR) 25 MG tablet TAKE 1 TABLET (25 MG TOTAL) BY MOUTH DAILY. 30 tablet 5  . Multiple Vitamin (MULTIVITAMIN) tablet Take 1 tablet by mouth daily.    . simvastatin (ZOCOR) 20 MG tablet TAKE 1 TABLET BY MOUTH AT BEDTIME 30 tablet 0  . UNABLE TO FIND Med Name:CPAP THERAPHY    . vardenafil (LEVITRA) 20 MG tablet Take 20 mg by mouth daily as needed.      . cyclobenzaprine (FLEXERIL) 10 MG tablet Take 1 tablet (10 mg total) by mouth at bedtime as needed for muscle spasms. (Patient not taking: Reported on 02/03/2014) 30 tablet 1   No current facility-administered medications on file prior to visit.   No Known Allergies History   Social History  . Marital Status: Single    Spouse Name: N/A    Number of Children: N/A  . Years of Education: N/A   Occupational History  . Not on file.   Social History Main Topics  . Smoking status: Never Smoker   . Smokeless tobacco: Never Used  . Alcohol Use: 1.2 oz/week    2 Not specified per week  . Drug Use: No  . Sexual Activity: Not on file   Other Topics Concern  . Not on file  Social History Narrative     Review of Systems  All other systems reviewed and are negative.      Objective:   Physical Exam  Neck: No thyromegaly present.  Cardiovascular: Normal rate, regular rhythm, normal heart sounds and intact distal pulses.   No murmur heard. Pulmonary/Chest: Effort normal and breath sounds normal. No respiratory distress. He has no wheezes. He has no rales.  Abdominal: Soft. Bowel sounds are normal.  Musculoskeletal:       Right elbow: Tenderness found. Lateral epicondyle tenderness noted.  Vitals reviewed.         Assessment & Plan:  Chronic fatigue - Plan: Testosterone, free, total  Depression  Right lateral epicondylitis  Decrease Lexapro to 10 mg a day for the next 3 weeks, if doing well, decreased to 10 mg every other day for 2-3 weeks, and if doing well at that time decreased to 10 mg  every third day for 3 weeks. Then discontinue the medication. If the patient requires medication in the future we can certainly switch to Effexor XR to try to decrease erectile dysfunction and fatigue.  I will also check a testosterone level regarding his fatigue. Patient requests a cortisone injection in his right elbow. Using sterile technique, I injected the tendon just distal to the right lateral epicondyles with 1 mL of 40 mg per mL Kenalog and 1 mL of 0.1% lidocaine. The patient tolerated procedure well without complication.

## 2014-02-04 LAB — TESTOSTERONE, FREE, TOTAL, SHBG
Sex Hormone Binding: 33 nmol/L (ref 10–50)
TESTOSTERONE-% FREE: 1.9 % (ref 1.6–2.9)
Testosterone, Free: 50 pg/mL (ref 47.0–244.0)
Testosterone: 258 ng/dL — ABNORMAL LOW (ref 300–890)

## 2014-02-17 ENCOUNTER — Telehealth: Payer: Self-pay | Admitting: Family Medicine

## 2014-02-17 DIAGNOSIS — R7989 Other specified abnormal findings of blood chemistry: Secondary | ICD-10-CM

## 2014-02-17 NOTE — Telephone Encounter (Signed)
Patient calling to give an update on his lexapro  [phone] as instructed by dr Dennard Schaumann

## 2014-02-17 NOTE — Telephone Encounter (Signed)
T is low.  If libido, etc are not improved off lexapro, I will gladly treat T deficiency with testosterone cypionate 200 mg IM q 2 weeks and recheck labs at ov in 3 months.

## 2014-02-17 NOTE — Telephone Encounter (Signed)
Calling to follow up on test results.  Does want you to treat the testosterone deficiency.  Please advise?

## 2014-02-18 MED ORDER — TESTOSTERONE CYPIONATE 200 MG/ML IM SOLN: 200.0000 mg | INTRAMUSCULAR | Status: DC

## 2014-02-18 NOTE — Telephone Encounter (Signed)
Rx will be ready for pick up tomorrow and pt made aware.  Told him we can show him how to give himself the injections or he can come by here and nurse will do.

## 2014-02-19 DIAGNOSIS — M65911 Unspecified synovitis and tenosynovitis, right shoulder: Secondary | ICD-10-CM | POA: Insufficient documentation

## 2014-02-19 DIAGNOSIS — M7711 Lateral epicondylitis, right elbow: Secondary | ICD-10-CM | POA: Insufficient documentation

## 2014-02-19 DIAGNOSIS — M65811 Other synovitis and tenosynovitis, right shoulder: Secondary | ICD-10-CM | POA: Insufficient documentation

## 2014-03-07 ENCOUNTER — Other Ambulatory Visit: Payer: Self-pay | Admitting: Family Medicine

## 2014-03-08 ENCOUNTER — Other Ambulatory Visit: Payer: Self-pay | Admitting: Family Medicine

## 2014-03-09 NOTE — Telephone Encounter (Signed)
ok 

## 2014-03-09 NOTE — Telephone Encounter (Signed)
Ok to refill??  Last office visit 02/03/2014.  Last refill 02/02/2014.

## 2014-03-09 NOTE — Telephone Encounter (Signed)
?   OK to Refill  

## 2014-04-03 ENCOUNTER — Other Ambulatory Visit: Payer: Self-pay | Admitting: Family Medicine

## 2014-04-03 ENCOUNTER — Encounter: Payer: Self-pay | Admitting: Family Medicine

## 2014-04-03 NOTE — Telephone Encounter (Signed)
Medication refill for one time only.  Patient needs to be seen.  Letter sent for patient to call and schedule 

## 2014-04-28 ENCOUNTER — Other Ambulatory Visit: Payer: Self-pay | Admitting: Family Medicine

## 2014-04-28 ENCOUNTER — Other Ambulatory Visit

## 2014-04-28 DIAGNOSIS — E669 Obesity, unspecified: Secondary | ICD-10-CM

## 2014-04-28 DIAGNOSIS — N411 Chronic prostatitis: Secondary | ICD-10-CM

## 2014-04-28 DIAGNOSIS — E291 Testicular hypofunction: Secondary | ICD-10-CM | POA: Insufficient documentation

## 2014-04-28 DIAGNOSIS — Z79899 Other long term (current) drug therapy: Secondary | ICD-10-CM

## 2014-04-28 DIAGNOSIS — E785 Hyperlipidemia, unspecified: Secondary | ICD-10-CM

## 2014-04-28 DIAGNOSIS — Z0000 Encounter for general adult medical examination without abnormal findings: Secondary | ICD-10-CM

## 2014-04-28 LAB — COMPLETE METABOLIC PANEL WITH GFR
ALT: 22 U/L (ref 0–53)
AST: 17 U/L (ref 0–37)
Albumin: 4.3 g/dL (ref 3.5–5.2)
Alkaline Phosphatase: 73 U/L (ref 39–117)
BUN: 10 mg/dL (ref 6–23)
CO2: 27 mEq/L (ref 19–32)
Calcium: 9.6 mg/dL (ref 8.4–10.5)
Chloride: 103 mEq/L (ref 96–112)
Creat: 0.77 mg/dL (ref 0.50–1.35)
GFR, Est Non African American: >89 mL/min
Glucose, Bld: 135 mg/dL — ABNORMAL HIGH (ref 70–99)
Potassium: 4.3 mEq/L (ref 3.5–5.3)
Sodium: 138 mEq/L (ref 135–145)
Total Bilirubin: 0.4 mg/dL (ref 0.2–1.2)
Total Protein: 6.9 g/dL (ref 6.0–8.3)

## 2014-04-28 LAB — CBC WITH DIFFERENTIAL/PLATELET
BASOS ABS: 0.1 10*3/uL (ref 0.0–0.1)
Basophils Relative: 2 % — ABNORMAL HIGH (ref 0–1)
Eosinophils Absolute: 0.1 10*3/uL (ref 0.0–0.7)
Eosinophils Relative: 2 % (ref 0–5)
HCT: 47.6 % (ref 39.0–52.0)
HEMOGLOBIN: 16.3 g/dL (ref 13.0–17.0)
LYMPHS ABS: 1.9 10*3/uL (ref 0.7–4.0)
LYMPHS PCT: 36 % (ref 12–46)
MCH: 31.5 pg (ref 26.0–34.0)
MCHC: 34.2 g/dL (ref 30.0–36.0)
MCV: 92.1 fL (ref 78.0–100.0)
MONOS PCT: 10 % (ref 3–12)
MPV: 9.7 fL (ref 8.6–12.4)
Monocytes Absolute: 0.5 10*3/uL (ref 0.1–1.0)
NEUTROS ABS: 2.7 10*3/uL (ref 1.7–7.7)
Neutrophils Relative %: 50 % (ref 43–77)
PLATELETS: 288 10*3/uL (ref 150–400)
RBC: 5.17 MIL/uL (ref 4.22–5.81)
RDW: 13.5 % (ref 11.5–15.5)
WBC: 5.4 10*3/uL (ref 4.0–10.5)

## 2014-04-28 LAB — LIPID PANEL
Cholesterol: 148 mg/dL (ref 0–200)
HDL: 29 mg/dL — ABNORMAL LOW (ref >=40)
LDL Cholesterol: 86 mg/dL (ref 0–99)
Total CHOL/HDL Ratio: 5.1 Ratio
Triglycerides: 163 mg/dL — ABNORMAL HIGH (ref <150)
VLDL: 33 mg/dL (ref 0–40)

## 2014-04-28 LAB — TSH: TSH: 2.874 u[IU]/mL (ref 0.350–4.500)

## 2014-04-29 LAB — TESTOSTERONE: Testosterone: 219 ng/dL — ABNORMAL LOW (ref 300–890)

## 2014-04-29 LAB — PSA: PSA: 0.46 ng/mL (ref <=4.00)

## 2014-04-30 LAB — HEMOGLOBIN A1C
Hgb A1c MFr Bld: 6.2 % — ABNORMAL HIGH (ref <5.7)
MEAN PLASMA GLUCOSE: 131 mg/dL — AB (ref <117)

## 2014-05-04 ENCOUNTER — Ambulatory Visit (INDEPENDENT_AMBULATORY_CARE_PROVIDER_SITE_OTHER): Admitting: Family Medicine

## 2014-05-04 ENCOUNTER — Encounter: Payer: Self-pay | Admitting: Family Medicine

## 2014-05-04 ENCOUNTER — Other Ambulatory Visit: Payer: Self-pay | Admitting: Family Medicine

## 2014-05-04 VITALS — BP 120/72 | HR 78 | Temp 97.9°F | Resp 18 | Ht 71.0 in | Wt 233.5 lb

## 2014-05-04 DIAGNOSIS — Z0000 Encounter for general adult medical examination without abnormal findings: Secondary | ICD-10-CM | POA: Diagnosis not present

## 2014-05-04 NOTE — Progress Notes (Signed)
Subjective:    Patient ID: Mike Henderson, male    DOB: 1965/12/11, 49 y.o.   MRN: [id number]  HPI Patient is here today for complete physical exam. He is not yet due for a colonoscopy. We are checking his prostate due to testosterone replacement/hypogonadism as well as a history of prostate cancer in his father. Since discontinuing his antidepressant medication and starting testosterone, his fatigue, libido, and erectile dysfunction have all improved. The patient does feel that he is more irritable but he attributes this to stress at work. This is his busy season and he is down to employees at work so he is under more stress and demands on his time. Otherwise he is doing well. He is having some mild tenderness in his right elbow at the lateral epicondyles but it is nowhere near as severe as it was in January when we injected it.  I explained to the patient that I do not like to perform repeat cortisone injections in the elbow due to the risk of tendon rupture. It has only been 3 months and his pain is minimal.  Lab on 04/28/2014  Component Date Value Ref Range Status  . PSA 04/28/2014 0.46  <=4.00 ng/mL Final   Comment: Test Methodology: ECLIA PSA (Electrochemiluminescence Immunoassay)   For PSA values from 2.5-4.0, particularly in younger men <85 years old, the AUA and NCCN suggest testing for % Free PSA (3515) and evaluation of the rate of increase in PSA (PSA velocity).   . Testosterone 04/28/2014 219* 300 - 890 ng/dL Final   Comment:           Tanner Stage       Male              Male               I              < 30 ng/dL        < 10 ng/dL               II             < 150 ng/dL       < 30 ng/dL               III            100-320 ng/dL     < 35 ng/dL               IV             200-970 ng/dL     15-40 ng/dL               V/Adult        300-890 ng/dL     10-70 ng/dL     . Sodium 04/28/2014 138  135 - 145 mEq/L Final  . Potassium 04/28/2014 4.3  3.5 - 5.3 mEq/L Final  .  Chloride 04/28/2014 103  96 - 112 mEq/L Final  . CO2 04/28/2014 27  19 - 32 mEq/L Final  . Glucose, Bld 04/28/2014 135* 70 - 99 mg/dL Final  . BUN 04/28/2014 10  6 - 23 mg/dL Final  . Creat 04/28/2014 0.77  0.50 - 1.35 mg/dL Final  . Total Bilirubin 04/28/2014 0.4  0.2 - 1.2 mg/dL Final  . Alkaline Phosphatase 04/28/2014 73  39 - 117 U/L Final  . AST 04/28/2014 17  0 - 37 U/L Final  .  ALT 04/28/2014 22  0 - 53 U/L Final  . Total Protein 04/28/2014 6.9  6.0 - 8.3 g/dL Final  . Albumin 04/28/2014 4.3  3.5 - 5.2 g/dL Final  . Calcium 04/28/2014 9.6  8.4 - 10.5 mg/dL Final  . GFR, Est African American 04/28/2014 >89   Final  . GFR, Est Non African American 04/28/2014 >89   Final   Comment:   The estimated GFR is a calculation valid for adults (>=39 years old) that uses the CKD-EPI algorithm to adjust for age and sex. It is   not to be used for children, pregnant women, hospitalized patients,    patients on dialysis, or with rapidly changing kidney function. According to the NKDEP, eGFR >89 is normal, 60-89 shows mild impairment, 30-59 shows moderate impairment, 15-29 shows severe impairment and <15 is ESRD.     . TSH 04/28/2014 2.874  0.350 - 4.500 uIU/mL Final  . WBC 04/28/2014 5.4  4.0 - 10.5 K/uL Final  . RBC 04/28/2014 5.17  4.22 - 5.81 MIL/uL Final  . Hemoglobin 04/28/2014 16.3  13.0 - 17.0 g/dL Final  . HCT 04/28/2014 47.6  39.0 - 52.0 % Final  . MCV 04/28/2014 92.1  78.0 - 100.0 fL Final  . MCH 04/28/2014 31.5  26.0 - 34.0 pg Final  . MCHC 04/28/2014 34.2  30.0 - 36.0 g/dL Final  . RDW 04/28/2014 13.5  11.5 - 15.5 % Final  . Platelets 04/28/2014 288  150 - 400 K/uL Final  . MPV 04/28/2014 9.7  8.6 - 12.4 fL Final  . Neutrophils Relative % 04/28/2014 50  43 - 77 % Final  . Neutro Abs 04/28/2014 2.7  1.7 - 7.7 K/uL Final  . Lymphocytes Relative 04/28/2014 36  12 - 46 % Final  . Lymphs Abs 04/28/2014 1.9  0.7 - 4.0 K/uL Final  . Monocytes Relative 04/28/2014 10  3 - 12 % Final    . Monocytes Absolute 04/28/2014 0.5  0.1 - 1.0 K/uL Final  . Eosinophils Relative 04/28/2014 2  0 - 5 % Final  . Eosinophils Absolute 04/28/2014 0.1  0.0 - 0.7 K/uL Final  . Basophils Relative 04/28/2014 2* 0 - 1 % Final  . Basophils Absolute 04/28/2014 0.1  0.0 - 0.1 K/uL Final  . Smear Review 04/28/2014 Criteria for review not met   Final  . Cholesterol 04/28/2014 148  0 - 200 mg/dL Final   Comment: ATP III Classification:       < 200        mg/dL        Desirable      200 - 239     mg/dL        Borderline High      >= 240        mg/dL        High     . Triglycerides 04/28/2014 163* <150 mg/dL Final  . HDL 04/28/2014 29* >=40 mg/dL Final   ** Please note change in reference range(s). **  . Total CHOL/HDL Ratio 04/28/2014 5.1   Final  . VLDL 04/28/2014 33  0 - 40 mg/dL Final  . LDL Cholesterol 04/28/2014 86  0 - 99 mg/dL Final   Comment:   Total Cholesterol/HDL Ratio:CHD Risk                        Coronary Heart Disease Risk Table  Men       Women          1/2 Average Risk              3.4        3.3              Average Risk              5.0        4.4           2X Average Risk              9.6        7.1           3X Average Risk             23.4       11.0 Use the calculated Patient Ratio above and the CHD Risk table  to determine the patient's CHD Risk. ATP III Classification (LDL):       < 100        mg/dL         Optimal      100 - 129     mg/dL         Near or Above Optimal      130 - 159     mg/dL         Borderline High      160 - 189     mg/dL         High       > 190        mg/dL         Very High     Orders Only on 04/28/2014  Component Date Value Ref Range Status  . Hgb A1c MFr Bld 04/28/2014 6.2* <5.7 % Final   Comment:                                                                        According to the ADA Clinical Practice Recommendations for 2011, when HbA1c is used as a screening test:     >=6.5%   Diagnostic  of Diabetes Mellitus            (if abnormal result is confirmed)   5.7-6.4%   Increased risk of developing Diabetes Mellitus   References:Diagnosis and Classification of Diabetes Mellitus,Diabetes JQBH,4193,79(KWIOX 1):S62-S69 and Standards of Medical Care in         Diabetes - 2011,Diabetes BDZH,2992,42 (Suppl 1):S11-S61.     . Mean Plasma Glucose 04/28/2014 131* <117 mg/dL Final    Past Medical History  Diagnosis Date  . Hyperlipidemia   . OSA (obstructive sleep apnea) 02/01/12    SLEEP STUDY [facility]  . Depression   . ED (erectile dysfunction)   . Chronic prostatitis   . Epididymitis   . Migraines   . Anxiety     Panic attacks  . GERD (gastroesophageal reflux disease)   . RBBB    Past Surgical History  Procedure Laterality Date  . Uvulopalatopharyngoplasty  08/2001  . Shoulder arthroscopy  09/2005    left  . Shoulder arthroscopy  01/2006    right  . Umbilical hernia  repair  01/2009   Current Outpatient Prescriptions on File Prior to Visit  Medication Sig Dispense Refill  . ALPRAZolam (XANAX) 0.5 MG tablet Take 1 tablet (0.5 mg total) by mouth at bedtime as needed for anxiety. Refills must be 30 days apart 30 tablet 2  . cyclobenzaprine (FLEXERIL) 10 MG tablet Take 1 tablet (10 mg total) by mouth at bedtime as needed for muscle spasms. (Patient not taking: Reported on 02/03/2014) 30 tablet 1  . escitalopram (LEXAPRO) 20 MG tablet TAKE 1 TABLET BY MOUTH DAILY (Patient not taking: Reported on 05/04/2014) 30 tablet 5  . lansoprazole (PREVACID) 30 MG capsule TAKE 1 CAPSULE DAILY BEFORE A MEAL 30 capsule 11  . losartan (COZAAR) 25 MG tablet TAKE 1 TABLET (25 MG TOTAL) BY MOUTH DAILY. 30 tablet 0  . Multiple Vitamin (MULTIVITAMIN) tablet Take 1 tablet by mouth daily.    . simvastatin (ZOCOR) 20 MG tablet TAKE 1 TABLET BY MOUTH AT BEDTIME 30 tablet 0  . simvastatin (ZOCOR) 20 MG tablet TAKE 1 TABLET BY MOUTH AT BEDTIME 30 tablet 3  . testosterone cypionate  (DEPOTESTOTERONE CYPIONATE) 200 MG/ML injection Inject 1 mL (200 mg total) into the muscle every 14 (fourteen) days. 10 mL 1  . UNABLE TO FIND Med Name:CPAP THERAPHY    . vardenafil (LEVITRA) 20 MG tablet Take 20 mg by mouth daily as needed.       No current facility-administered medications on file prior to visit.   No Known Allergies History   Social History  . Marital Status: Single    Spouse Name: N/A  . Number of Children: N/A  . Years of Education: N/A   Occupational History  . Not on file.   Social History Main Topics  . Smoking status: Never Smoker   . Smokeless tobacco: Never Used  . Alcohol Use: 1.2 oz/week    2 Standard drinks or equivalent per week  . Drug Use: No  . Sexual Activity: Not on file   Other Topics Concern  . Not on file   Social History Narrative      Review of Systems  All other systems reviewed and are negative.      Objective:   Physical Exam  Constitutional: He is oriented to person, place, and time. He appears well-developed and well-nourished. No distress.  HENT:  Head: Normocephalic and atraumatic.  Right Ear: External ear normal.  Left Ear: External ear normal.  Nose: Nose normal.  Mouth/Throat: Oropharynx is clear and moist. No oropharyngeal exudate.  Eyes: Conjunctivae and EOM are normal. Pupils are equal, round, and reactive to light. Right eye exhibits no discharge. Left eye exhibits no discharge. No scleral icterus.  Neck: Normal range of motion. Neck supple. No JVD present. No tracheal deviation present. No thyromegaly present.  Cardiovascular: Normal rate, regular rhythm, normal heart sounds and intact distal pulses.  Exam reveals no gallop and no friction rub.   No murmur heard. Pulmonary/Chest: Effort normal and breath sounds normal. No stridor. No respiratory distress. He has no wheezes. He has no rales. He exhibits no tenderness.  Abdominal: Soft. Bowel sounds are normal. He exhibits no distension and no mass. There is  no tenderness. There is no rebound and no guarding.  Genitourinary: Rectum normal, prostate normal and penis normal.  Musculoskeletal: Normal range of motion. He exhibits no edema or tenderness.  Lymphadenopathy:    He has no cervical adenopathy.  Neurological: He is alert and oriented to person, place, and time. He has normal  reflexes. He displays normal reflexes. No cranial nerve deficit. He exhibits normal muscle tone. Coordination normal.  Skin: Skin is warm. No rash noted. He is not diaphoretic. No erythema. No pallor.  Psychiatric: He has a normal mood and affect. His behavior is normal. Judgment and thought content normal.  Vitals reviewed.         Assessment & Plan:  Routine general medical examination at a health care facility  Patient's blood pressure is excellent. His LDL cholesterol is excellent. His HDL cholesterol has fallen. Furthermore his hemoglobin A1c has risen. I have recommended 30 pounds of weight loss. I have recommended increasing aerobic exercise to 30 minutes a day. I would like to recheck the patient's labs in 6 months. The patient is not yet due for colonoscopy. I will schedule when he turns 49. Otherwise his preventative care is up-to-date. Recheck in 6 months. I would not inject his elbow at this time. Patient understands and would like to defer the injection until the pain becomes worse. I did recommend using an elbow strap.

## 2014-05-14 ENCOUNTER — Telehealth: Payer: Self-pay | Admitting: *Deleted

## 2014-05-14 MED ORDER — VENLAFAXINE HCL ER 150 MG PO CP24: 150.0000 mg | ORAL_CAPSULE | Freq: Every day | ORAL | Status: DC

## 2014-05-14 MED ORDER — VENLAFAXINE HCL ER 75 MG PO CP24: ORAL_CAPSULE | ORAL | Status: DC

## 2014-05-14 NOTE — Telephone Encounter (Signed)
Prescription sent to pharmacy. .   Call placed to patient and patient made aware.  

## 2014-05-14 NOTE — Telephone Encounter (Signed)
Begin effexor xr 75 mg poqam for 2 weeks and then increase to 150 mg poqam thereafter and recheck in 4 weeks.

## 2014-05-14 NOTE — Telephone Encounter (Signed)
Pt called stating that was here on 05/04/14 and was told that if he needed to add Effexor to his medication to give [place] a call, pt says he wants it added and that he needs a refill called into his pharmacy.  [facility]

## 2014-06-01 ENCOUNTER — Other Ambulatory Visit: Payer: Self-pay | Admitting: Family Medicine

## 2014-06-01 NOTE — Telephone Encounter (Signed)
ok 

## 2014-06-01 NOTE — Telephone Encounter (Signed)
Ok to refill??  Last office visit 05/04/2014.  Last refill 03/09/2014, #2 refills.

## 2014-06-02 NOTE — Telephone Encounter (Signed)
Medication called to pharmacy. 

## 2014-06-07 ENCOUNTER — Other Ambulatory Visit: Payer: Self-pay | Admitting: Family Medicine

## 2014-07-21 ENCOUNTER — Other Ambulatory Visit: Payer: Self-pay | Admitting: Family Medicine

## 2014-07-21 NOTE — Telephone Encounter (Signed)
Medication refilled per protocol. 

## 2014-08-20 ENCOUNTER — Ambulatory Visit (INDEPENDENT_AMBULATORY_CARE_PROVIDER_SITE_OTHER): Admitting: Family Medicine

## 2014-08-20 ENCOUNTER — Encounter: Payer: Self-pay | Admitting: Family Medicine

## 2014-08-20 VITALS — BP 100/58 | HR 60 | Temp 98.3°F | Resp 16 | Ht 71.0 in | Wt 227.0 lb

## 2014-08-20 DIAGNOSIS — M7711 Lateral epicondylitis, right elbow: Secondary | ICD-10-CM | POA: Diagnosis not present

## 2014-08-20 NOTE — Progress Notes (Signed)
Subjective:    Patient ID: Mike Henderson, male    DOB: April 09, 1965, 49 y.o.   MRN: [id number]  HPI Patient recently fell and injured his right elbow. He is tender to palpation over the right lateral epicondyles. Pain is exacerbated by gripping with his hand, resisted wrist dorsiflexion, and supination and pronation of the elbow. Patient has a history of tennis elbow in that area. There is no swelling. There is no ecchymosis. Past Medical History  Diagnosis Date  . Hyperlipidemia   . OSA (obstructive sleep apnea) 02/01/12    SLEEP STUDY [facility]  . Depression   . ED (erectile dysfunction)   . Chronic prostatitis   . Epididymitis   . Migraines   . Anxiety     Panic attacks  . GERD (gastroesophageal reflux disease)   . RBBB    Past Surgical History  Procedure Laterality Date  . Uvulopalatopharyngoplasty  08/2001  . Shoulder arthroscopy  09/2005    left  . Shoulder arthroscopy  01/2006    right  . Umbilical hernia repair  01/2009   Current Outpatient Prescriptions on File Prior to Visit  Medication Sig Dispense Refill  . escitalopram (LEXAPRO) 20 MG tablet TAKE 1 TABLET BY MOUTH DAILY 30 tablet 5  . ALPRAZolam (XANAX) 0.5 MG tablet TAKE 1 TABLET AT BEDTIME AS NEEDED FOR SLEEP 30 tablet 2  . lansoprazole (PREVACID) 30 MG capsule TAKE 1 CAPSULE DAILY BEFORE A MEAL 30 capsule 11  . losartan (COZAAR) 25 MG tablet TAKE 1 TABLET BY MOUTH EVERY DAY 30 tablet 3  . Multiple Vitamin (MULTIVITAMIN) tablet Take 1 tablet by mouth daily.    . simvastatin (ZOCOR) 20 MG tablet TAKE 1 TABLET BY MOUTH AT BEDTIME 30 tablet 4  . testosterone cypionate (DEPOTESTOTERONE CYPIONATE) 200 MG/ML injection Inject 1 mL (200 mg total) into the muscle every 14 (fourteen) days. 10 mL 1  . UNABLE TO FIND Med Name:CPAP THERAPHY    . vardenafil (LEVITRA) 20 MG tablet Take 20 mg by mouth daily as needed.      . venlafaxine XR (EFFEXOR XR) 150 MG 24 hr capsule Take 1 capsule (150 mg total)  by mouth daily with breakfast. Begin after completing 75mg  Taper 30 capsule 0  . venlafaxine XR (EFFEXOR-XR) 75 MG 24 hr capsule TAKE ONE CAPSULE BY MOUTH EVERY MORNING X 2WEEKS,THEN START 2CAPS EVERY MORNING 30 capsule 5   No current facility-administered medications on file prior to visit.   No Known Allergies History   Social History  . Marital Status: Single    Spouse Name: N/A  . Number of Children: N/A  . Years of Education: N/A   Occupational History  . Not on file.   Social History Main Topics  . Smoking status: Never Smoker   . Smokeless tobacco: Never Used  . Alcohol Use: 1.2 oz/week    2 Standard drinks or equivalent per week  . Drug Use: No  . Sexual Activity: Not on file   Other Topics Concern  . Not on file   Social History Narrative      Review of Systems  All other systems reviewed and are negative.      Objective:   Physical Exam  Cardiovascular: Normal rate, regular rhythm and normal heart sounds.   Pulmonary/Chest: Effort normal and breath sounds normal.  Musculoskeletal:       Right elbow: He exhibits normal range of motion and no effusion. Tenderness found. Lateral epicondyle tenderness noted.  No radial head and no olecranon process tenderness noted.  Vitals reviewed.         Assessment & Plan:  Tennis elbow syndrome, right  Patient has tennis elbow in his right elbow. Using sterile technique, I injected just distal to the lateral epicondyles mixture of 1 mL of lidocaine and 1 mL of 40 mg per mL Kenalog. Patient tolerated procedure well without complication.

## 2014-09-05 ENCOUNTER — Other Ambulatory Visit: Payer: Self-pay | Admitting: Family Medicine

## 2014-09-07 NOTE — Telephone Encounter (Signed)
Ok to refill Xanax??  Last office visit 08/20/2014.  Last refill 5/3/216, #2 refills.

## 2014-09-07 NOTE — Telephone Encounter (Signed)
ok 

## 2014-09-07 NOTE — Telephone Encounter (Signed)
Medication called to pharmacy. 

## 2014-09-09 ENCOUNTER — Other Ambulatory Visit: Payer: Self-pay | Admitting: Family Medicine

## 2014-09-09 ENCOUNTER — Telehealth: Payer: Self-pay | Admitting: Family Medicine

## 2014-09-09 DIAGNOSIS — M7711 Lateral epicondylitis, right elbow: Secondary | ICD-10-CM

## 2014-09-09 NOTE — Telephone Encounter (Signed)
Refill denied.   Prescription called to pharmacy on 09/07/2014.

## 2014-09-09 NOTE — Telephone Encounter (Signed)
MRI ordered and pt aware via mychart

## 2014-09-09 NOTE — Telephone Encounter (Signed)
Patient said last time he was here dr pickard said if elbow was not better that you would place referral for mri, he wants to know if this can go ahead and be placed  [phone] if any questions

## 2014-09-09 NOTE — Telephone Encounter (Signed)
Try to schedule mri of the elbow for refractory lateral epicondylitis.

## 2014-09-24 ENCOUNTER — Encounter: Payer: Self-pay | Admitting: Family Medicine

## 2014-09-24 ENCOUNTER — Telehealth: Payer: Self-pay | Admitting: Family Medicine

## 2014-09-24 NOTE — Telephone Encounter (Signed)
MRI of rt elbow done at [facility] showed Moderate to severe signal changes or common extensor tendon consistent with lateral epicondylitis. Per [facility] Needs to see ortho as he is failing conservative therapy.   Patient was sent an email through my chart and if he wishes we will order ortho referral - will await to hear back from pt.

## 2014-09-30 NOTE — Telephone Encounter (Signed)
Pt aware of the MRI results and states that he has an ortho that he sees/ has seen in past and he will make an appt himself. Informed pt that is we needed to do anything to just give [place] a call back.

## 2014-10-13 ENCOUNTER — Other Ambulatory Visit: Payer: Self-pay | Admitting: Family Medicine

## 2014-11-01 ENCOUNTER — Other Ambulatory Visit: Payer: Self-pay | Admitting: Family Medicine

## 2014-11-02 NOTE — Telephone Encounter (Signed)
ok 

## 2014-11-02 NOTE — Telephone Encounter (Signed)
?   OK to Refill  

## 2014-11-04 ENCOUNTER — Other Ambulatory Visit: Payer: Self-pay | Admitting: Family Medicine

## 2014-11-04 MED ORDER — LOSARTAN POTASSIUM 25 MG PO TABS: 25.0000 mg | ORAL_TABLET | Freq: Every day | ORAL | Status: DC

## 2014-11-04 MED ORDER — SIMVASTATIN 20 MG PO TABS: 20.0000 mg | ORAL_TABLET | Freq: Every day | ORAL | Status: DC

## 2014-11-04 MED ORDER — VENLAFAXINE HCL ER 150 MG PO CP24: 150.0000 mg | ORAL_CAPSULE | Freq: Every day | ORAL | Status: DC

## 2014-11-20 ENCOUNTER — Encounter: Payer: Self-pay | Admitting: Family Medicine

## 2014-12-11 ENCOUNTER — Other Ambulatory Visit: Payer: Self-pay | Admitting: *Deleted

## 2014-12-11 MED ORDER — VENLAFAXINE HCL ER 150 MG PO CP24: 150.0000 mg | ORAL_CAPSULE | Freq: Every day | ORAL | Status: DC

## 2014-12-11 MED ORDER — LOSARTAN POTASSIUM 25 MG PO TABS: 25.0000 mg | ORAL_TABLET | Freq: Every day | ORAL | Status: DC

## 2014-12-11 MED ORDER — ESCITALOPRAM OXALATE 20 MG PO TABS: 20.0000 mg | ORAL_TABLET | Freq: Every day | ORAL | Status: DC

## 2014-12-11 MED ORDER — LANSOPRAZOLE 30 MG PO CPDR: DELAYED_RELEASE_CAPSULE | ORAL | Status: DC

## 2014-12-11 MED ORDER — SIMVASTATIN 20 MG PO TABS: 20.0000 mg | ORAL_TABLET | Freq: Every day | ORAL | Status: DC

## 2014-12-11 NOTE — Telephone Encounter (Signed)
Received letter from patient requesting refill on all medications through [facility].   Prescriptions for non-controled medications sent to pharmacy.

## 2014-12-12 ENCOUNTER — Other Ambulatory Visit: Payer: Self-pay | Admitting: Family Medicine

## 2015-03-01 ENCOUNTER — Telehealth: Payer: Self-pay | Admitting: Family Medicine

## 2015-03-01 MED ORDER — ALPRAZOLAM 0.5 MG PO TABS: ORAL_TABLET | ORAL | Status: DC

## 2015-03-01 NOTE — Telephone Encounter (Signed)
Patient calling in requesting a refill on his ALPRAZolam (XANAX) 0.5 MG tablet  [facility] [address] [phone]

## 2015-03-01 NOTE — Telephone Encounter (Signed)
Medication called/sent to requested pharmacy  

## 2015-03-01 NOTE — Telephone Encounter (Signed)
?   OK to Refill  

## 2015-03-01 NOTE — Telephone Encounter (Signed)
ok 

## 2015-03-29 ENCOUNTER — Telehealth: Payer: Self-pay | Admitting: *Deleted

## 2015-03-29 NOTE — Telephone Encounter (Signed)
Faxed order for CPAP H20 chamber to [facility] that was given to the patient on 02/17/15.

## 2015-07-12 ENCOUNTER — Other Ambulatory Visit: Payer: Self-pay | Admitting: Family Medicine

## 2015-09-29 ENCOUNTER — Telehealth: Payer: Self-pay | Admitting: *Deleted

## 2015-09-29 NOTE — Telephone Encounter (Signed)
Received fax requesting refill on Xanax.   Ok to refill??  Last office visit 08/20/2015.  Last refill 03/01/2015, #2 refills.

## 2015-09-30 MED ORDER — ALPRAZOLAM 0.5 MG PO TABS: ORAL_TABLET | ORAL | 0 refills | Status: DC

## 2015-09-30 NOTE — Telephone Encounter (Signed)
Medication called to pharmacy.  Appointment scheduled.  

## 2015-09-30 NOTE — Telephone Encounter (Signed)
Ok with rf x 1 but due for ov.

## 2015-10-14 ENCOUNTER — Ambulatory Visit (INDEPENDENT_AMBULATORY_CARE_PROVIDER_SITE_OTHER): Admitting: Family Medicine

## 2015-10-14 ENCOUNTER — Other Ambulatory Visit: Payer: Self-pay | Admitting: Family Medicine

## 2015-10-14 ENCOUNTER — Encounter: Payer: Self-pay | Admitting: Family Medicine

## 2015-10-14 VITALS — BP 110/78 | HR 60 | Temp 97.7°F | Resp 14 | Ht 71.0 in | Wt 211.0 lb

## 2015-10-14 DIAGNOSIS — Z125 Encounter for screening for malignant neoplasm of prostate: Secondary | ICD-10-CM | POA: Diagnosis not present

## 2015-10-14 DIAGNOSIS — F329 Major depressive disorder, single episode, unspecified: Secondary | ICD-10-CM | POA: Diagnosis not present

## 2015-10-14 DIAGNOSIS — I10 Essential (primary) hypertension: Secondary | ICD-10-CM

## 2015-10-14 DIAGNOSIS — Z1211 Encounter for screening for malignant neoplasm of colon: Secondary | ICD-10-CM | POA: Diagnosis not present

## 2015-10-14 DIAGNOSIS — F32A Depression, unspecified: Secondary | ICD-10-CM

## 2015-10-14 LAB — COMPLETE METABOLIC PANEL WITH GFR
ALBUMIN: 4.1 g/dL (ref 3.6–5.1)
ALK PHOS: 96 U/L (ref 40–115)
ALT: 13 U/L (ref 9–46)
AST: 10 U/L (ref 10–35)
BILIRUBIN TOTAL: 0.4 mg/dL (ref 0.2–1.2)
BUN: 12 mg/dL (ref 7–25)
CO2: 26 mmol/L (ref 20–31)
CREATININE: 0.76 mg/dL (ref 0.70–1.33)
Calcium: 9.2 mg/dL (ref 8.6–10.3)
Chloride: 100 mmol/L (ref 98–110)
GFR, Est African American: >89 mL/min (ref >=60)
GFR, Est Non African American: >89 mL/min (ref >=60)
GLUCOSE: 252 mg/dL — AB (ref 70–99)
POTASSIUM: 4.5 mmol/L (ref 3.5–5.3)
SODIUM: 136 mmol/L (ref 135–146)
TOTAL PROTEIN: 6.7 g/dL (ref 6.1–8.1)

## 2015-10-14 LAB — CBC WITH DIFFERENTIAL/PLATELET
Basophils Absolute: 55 cells/uL (ref 0–200)
Basophils Relative: 1 %
EOS PCT: 2 %
Eosinophils Absolute: 110 cells/uL (ref 15–500)
HCT: 46 % (ref 38.5–50.0)
HEMOGLOBIN: 15.5 g/dL (ref 13.0–17.0)
LYMPHS ABS: 1870 {cells}/uL (ref 850–3900)
Lymphocytes Relative: 34 %
MCH: 30.4 pg (ref 27.0–33.0)
MCHC: 33.7 g/dL (ref 32.0–36.0)
MCV: 90.2 fL (ref 80.0–100.0)
MONO ABS: 440 {cells}/uL (ref 200–950)
MPV: 9.8 fL (ref 7.5–12.5)
Monocytes Relative: 8 %
NEUTROS ABS: 3025 {cells}/uL (ref 1500–7800)
Neutrophils Relative %: 55 %
Platelets: 241 10*3/uL (ref 140–400)
RBC: 5.1 MIL/uL (ref 4.20–5.80)
RDW: 12.5 % (ref 11.0–15.0)
WBC: 5.5 10*3/uL (ref 3.8–10.8)

## 2015-10-14 LAB — LIPID PANEL
CHOL/HDL RATIO: 4.6 ratio (ref <=5.0)
Cholesterol: 197 mg/dL (ref 125–200)
HDL: 43 mg/dL (ref >=40)
LDL Cholesterol: 119 mg/dL (ref <130)
Triglycerides: 177 mg/dL — ABNORMAL HIGH (ref <150)
VLDL: 35 mg/dL — ABNORMAL HIGH (ref <30)

## 2015-10-14 LAB — PSA: PSA: 0.4 ng/mL (ref <=4.0)

## 2015-10-14 NOTE — Progress Notes (Signed)
Subjective:    Patient ID: Mike Henderson, male    DOB: [birth year], 50 y.o.   MRN: [id number]  HPI Patient is here today for a checkup. He is currently on losartan 25 mg a day for hypertension. He has lost 16 pounds since last office visit. His blood pressure is much better. May not require medication any further. Denies chest pain shortness of breath or dyspnea on exertion. He is also on simvastatin 20 mg a day for hyperlipidemia. He denies myalgias or right upper quadrant pain. He is overdue for prostate cancer screening and request a PSA. He is also overdue for colonoscopy. He is willing to allow me to schedule colonoscopy. He declines a flu shot today. He is requesting a refill on his Effexor which he uses for depression and anxiety. Fortunately the patient states that he is doing extremely well. He feels much better than he has over the last few years as his stress and anxiety have improved. He is trying exercise any better. I believe this is reflected in his weight loss Past Medical History:  Diagnosis Date  . Anxiety    Panic attacks  . Chronic prostatitis   . Depression   . ED (erectile dysfunction)   . Epididymitis   . GERD (gastroesophageal reflux disease)   . Hyperlipidemia   . Migraines   . OSA (obstructive sleep apnea) 02/01/12   SLEEP STUDY [facility]  . RBBB    Past Surgical History:  Procedure Laterality Date  . SHOULDER ARTHROSCOPY  09/2005   left  . SHOULDER ARTHROSCOPY  01/2006   right  . UMBILICAL HERNIA REPAIR  01/2009  . UVULOPALATOPHARYNGOPLASTY  08/2001   Current Outpatient Prescriptions on File Prior to Visit  Medication Sig Dispense Refill  . ALPRAZolam (XANAX) 0.5 MG tablet TAKE 1 TABLET BY MOUTH AT BEDTIME AS NEEDED FOR SLEEP,ANXIETY 30 tablet 0  . lansoprazole (PREVACID) 30 MG capsule TAKE 1 CAPSULE DAILY BEFORE A MEAL 90 capsule 3  . losartan (COZAAR) 25 MG tablet Take 1 tablet (25 mg total) by mouth daily. 90 tablet 1  .  Multiple Vitamin (MULTIVITAMIN) tablet Take 1 tablet by mouth daily.    . simvastatin (ZOCOR) 20 MG tablet Take 1 tablet (20 mg total) by mouth at bedtime. 90 tablet 1  . testosterone cypionate (DEPOTESTOSTERONE CYPIONATE) 200 MG/ML injection INJECT 1ML INTO THE MUSCLE EVERY 14 DAYS 10 mL 3  . UNABLE TO FIND Med Name:CPAP THERAPHY    . vardenafil (LEVITRA) 20 MG tablet Take 20 mg by mouth daily as needed.      . venlafaxine XR (EFFEXOR XR) 150 MG 24 hr capsule Take 1 capsule (150 mg total) by mouth daily with breakfast. 90 capsule 1   No current facility-administered medications on file prior to visit.    No Known Allergies Social History   Social History  . Marital status: Single    Spouse name: N/A  . Number of children: N/A  . Years of education: N/A   Occupational History  . Not on file.   Social History Main Topics  . Smoking status: Never Smoker  . Smokeless tobacco: Never Used  . Alcohol use 1.2 oz/week    2 Standard drinks or equivalent per week  . Drug use: No  . Sexual activity: Not on file   Other Topics Concern  . Not on file   Social History Narrative  . No narrative on file      Review of  Systems  All other systems reviewed and are negative.      Objective:   Physical Exam  Constitutional: He appears well-developed and well-nourished. No distress.  HENT:  Mouth/Throat: No oropharyngeal exudate.  Eyes: Conjunctivae and EOM are normal. Pupils are equal, round, and reactive to light. No scleral icterus.  Neck: Neck supple. No JVD present. No thyromegaly present.  Cardiovascular: Normal rate, regular rhythm and normal heart sounds.   No murmur heard. Pulmonary/Chest: Effort normal and breath sounds normal. No respiratory distress. He has no wheezes. He has no rales.  Abdominal: Soft. Bowel sounds are normal. He exhibits no distension. There is no tenderness. There is no rebound and no guarding.  Musculoskeletal: He exhibits no edema.  Lymphadenopathy:      He has no cervical adenopathy.  Skin: He is not diaphoretic.  Vitals reviewed.         Assessment & Plan:  Benign essential HTN - Plan: CBC with Differential/Platelet, COMPLETE METABOLIC PANEL WITH GFR, Lipid panel  Prostate cancer screening - Plan: PSA  Depression  Blood pressures well controlled. Patient will temporarily discontinue losartan and we will monitor his blood pressure. As long as his blood pressure is less than 140/90, he may no longer require the medication due to slice all changes in weight loss. I will also check his cholesterol. His goal LDL cholesterol is less than 130. While checking lab work I will screen the patient for prostate cancer with a PSA. I will also schedule the patient for colonoscopy. He is due for colon cancer screening. He declines hepatitis C screening even though he has several tattoos. He declines a flu shot today

## 2015-10-16 LAB — HEMOGLOBIN A1C
HEMOGLOBIN A1C: 9.8 % — AB (ref <5.7)
MEAN PLASMA GLUCOSE: 235 mg/dL

## 2015-10-18 ENCOUNTER — Encounter: Payer: Self-pay | Admitting: Family Medicine

## 2015-10-18 ENCOUNTER — Encounter: Payer: Self-pay | Admitting: Gastroenterology

## 2015-10-25 ENCOUNTER — Ambulatory Visit (INDEPENDENT_AMBULATORY_CARE_PROVIDER_SITE_OTHER): Admitting: Family Medicine

## 2015-10-25 VITALS — BP 112/78 | HR 78 | Temp 97.9°F | Resp 16 | Ht 71.0 in | Wt 212.0 lb

## 2015-10-25 DIAGNOSIS — E1165 Type 2 diabetes mellitus with hyperglycemia: Secondary | ICD-10-CM

## 2015-10-25 DIAGNOSIS — IMO0001 Reserved for inherently not codable concepts without codable children: Secondary | ICD-10-CM

## 2015-10-25 MED ORDER — METFORMIN HCL 500 MG PO TABS: 1000.0000 mg | ORAL_TABLET | Freq: Two times a day (BID) | ORAL | 3 refills | Status: DC

## 2015-10-25 NOTE — Progress Notes (Signed)
Subjective:    Patient ID: Mike Henderson, male    DOB: [birth year], 50 y.o.   MRN: [id number]  HPI  10/14/15 Patient is here today for a checkup. He is currently on losartan 25 mg a day for hypertension. He has lost 16 pounds since last office visit. His blood pressure is much better. May not require medication any further. Denies chest pain shortness of breath or dyspnea on exertion. He is also on simvastatin 20 mg a day for hyperlipidemia. He denies myalgias or right upper quadrant pain. He is overdue for prostate cancer screening and request a PSA. He is also overdue for colonoscopy. He is willing to allow me to schedule colonoscopy. He declines a flu shot today. He is requesting a refill on his Effexor which he uses for depression and anxiety. Fortunately the patient states that he is doing extremely well. He feels much better than he has over the last few years as his stress and anxiety have improved. He is trying exercise any better. I believe this is reflected in his weight loss.  At that time, my plan was: Blood pressures well controlled. Patient will temporarily discontinue losartan and we will monitor his blood pressure. As long as his blood pressure is less than 140/90, he may no longer require the medication due to slice all changes in weight loss. I will also check his cholesterol. His goal LDL cholesterol is less than 130. While checking lab work I will screen the patient for prostate cancer with a PSA. I will also schedule the patient for colonoscopy. He is due for colon cancer screening. He declines hepatitis C screening even though he has several tattoos. He declines a flu shot today  10/25/15 Orders Only on 10/14/2015  Component Date Value Ref Range Status  . Hgb A1c MFr Bld 10/16/2015 9.8* <5.7 % Final   Comment:   For someone without known diabetes, a hemoglobin A1c value of 6.5% or greater indicates that they may have diabetes and this should be confirmed with a follow-up  test.   For someone with known diabetes, a value <7% indicates that their diabetes is well controlled and a value greater than or equal to 7% indicates suboptimal control. A1c targets should be individualized based on duration of diabetes, age, comorbid conditions, and other considerations.   Currently, no consensus exists for use of hemoglobin A1c for diagnosis of diabetes for children.     . Mean Plasma Glucose 10/16/2015 235  mg/dL Final  Office Visit on 10/14/2015  Component Date Value Ref Range Status  . WBC 10/14/2015 5.5  3.8 - 10.8 K/uL Final  . RBC 10/14/2015 5.10  4.20 - 5.80 MIL/uL Final  . Hemoglobin 10/14/2015 15.5  13.0 - 17.0 g/dL Final  . HCT 10/14/2015 46.0  38.5 - 50.0 % Final  . MCV 10/14/2015 90.2  80.0 - 100.0 fL Final  . MCH 10/14/2015 30.4  27.0 - 33.0 pg Final  . MCHC 10/14/2015 33.7  32.0 - 36.0 g/dL Final  . RDW 10/14/2015 12.5  11.0 - 15.0 % Final  . Platelets 10/14/2015 241  140 - 400 K/uL Final  . MPV 10/14/2015 9.8  7.5 - 12.5 fL Final  . Neutro Abs 10/14/2015 3025  1,500 - 7,800 cells/uL Final  . Lymphs Abs 10/14/2015 1870  850 - 3,900 cells/uL Final  . Monocytes Absolute 10/14/2015 440  200 - 950 cells/uL Final  . Eosinophils Absolute 10/14/2015 110  15 - 500 cells/uL Final  . Basophils  Absolute 10/14/2015 55  0 - 200 cells/uL Final  . Neutrophils Relative % 10/14/2015 55  % Final  . Lymphocytes Relative 10/14/2015 34  % Final  . Monocytes Relative 10/14/2015 8  % Final  . Eosinophils Relative 10/14/2015 2  % Final  . Basophils Relative 10/14/2015 1  % Final  . Smear Review 10/14/2015 Criteria for review not met   Final  . Sodium 10/14/2015 136  135 - 146 mmol/L Final  . Potassium 10/14/2015 4.5  3.5 - 5.3 mmol/L Final  . Chloride 10/14/2015 100  98 - 110 mmol/L Final  . CO2 10/14/2015 26  20 - 31 mmol/L Final  . Glucose, Bld 10/14/2015 252* 70 - 99 mg/dL Final  . BUN 10/14/2015 12  7 - 25 mg/dL Final  . Creat 10/14/2015 0.76  0.70 - 1.33  mg/dL Final   Comment:   For patients > or = 51 years of age: The upper reference limit for Creatinine is approximately 13% higher for people identified as African-American.     . Total Bilirubin 10/14/2015 0.4  0.2 - 1.2 mg/dL Final  . Alkaline Phosphatase 10/14/2015 96  40 - 115 U/L Final  . AST 10/14/2015 10  10 - 35 U/L Final  . ALT 10/14/2015 13  9 - 46 U/L Final  . Total Protein 10/14/2015 6.7  6.1 - 8.1 g/dL Final  . Albumin 10/14/2015 4.1  3.6 - 5.1 g/dL Final  . Calcium 10/14/2015 9.2  8.6 - 10.3 mg/dL Final  . GFR, Est African American 10/14/2015 >89  >=60 mL/min Final  . GFR, Est Non African American 10/14/2015 >89  >=60 mL/min Final  . Cholesterol 10/14/2015 197  125 - 200 mg/dL Final  . Triglycerides 10/14/2015 177* <150 mg/dL Final  . HDL 10/14/2015 43  >=40 mg/dL Final  . Total CHOL/HDL Ratio 10/14/2015 4.6  <=5.0 Ratio Final  . VLDL 10/14/2015 35* <30 mg/dL Final  . LDL Cholesterol 10/14/2015 119  <130 mg/dL Final   Comment:   Total Cholesterol/HDL Ratio:CHD Risk                        Coronary Heart Disease Risk Table                                        Men       Women          1/2 Average Risk              3.4        3.3              Average Risk              5.0        4.4           2X Average Risk              9.6        7.1           3X Average Risk             23.4       11.0 Use the calculated Patient Ratio above and the CHD Risk table  to determine the patient's CHD Risk.   Marland Kitchen PSA 10/14/2015 0.4  <=4.0 ng/mL Final   Comment:   The total PSA value  from this assay system is standardized against the WHO standard. The test result will be approximately 20% lower when compared to the equimolar-standardized total PSA ([employer] Coul[employer]). Comparison of serial PSA results should be interpreted with this fact in mind.   This test was performed using the Siemens chemiluminescent method. Values obtained from different assay methods cannot be  used interchangeably. PSA levels, regardless of value, should not be interpreted as absolute evidence of the presence or absence of disease.   Effective September 06, 2015, Total PSA is being tested on the Siemens Centaur XP using chemiluminescence methodology. Re-baseline testing will be available until December 06, 2015 at no charge. If you have a patient that may require re-baselining, please order [id number] in addition to [id number].    Here to discuss new onset diabetes mellitus 2.   Past Medical History:  Diagnosis Date  . Anxiety    Panic attacks  . Chronic prostatitis   . Depression   . ED (erectile dysfunction)   . Epididymitis   . GERD (gastroesophageal reflux disease)   . Hyperlipidemia   . Migraines   . OSA (obstructive sleep apnea) 02/01/12   SLEEP STUDY [facility]  . RBBB    Past Surgical History:  Procedure Laterality Date  . SHOULDER ARTHROSCOPY  09/2005   left  . SHOULDER ARTHROSCOPY  01/2006   right  . UMBILICAL HERNIA REPAIR  01/2009  . UVULOPALATOPHARYNGOPLASTY  08/2001   Current Outpatient Prescriptions on File Prior to Visit  Medication Sig Dispense Refill  . ALPRAZolam (XANAX) 0.5 MG tablet TAKE 1 TABLET BY MOUTH AT BEDTIME AS NEEDED FOR SLEEP,ANXIETY 30 tablet 0  . lansoprazole (PREVACID) 30 MG capsule TAKE 1 CAPSULE DAILY BEFORE A MEAL 90 capsule 3  . losartan (COZAAR) 25 MG tablet Take 1 tablet (25 mg total) by mouth daily. 90 tablet 1  . Multiple Vitamin (MULTIVITAMIN) tablet Take 1 tablet by mouth daily.    . simvastatin (ZOCOR) 20 MG tablet Take 1 tablet (20 mg total) by mouth at bedtime. 90 tablet 1  . testosterone cypionate (DEPOTESTOSTERONE CYPIONATE) 200 MG/ML injection INJECT 1ML INTO THE MUSCLE EVERY 14 DAYS 10 mL 3  . UNABLE TO FIND Med Name:CPAP THERAPHY    . vardenafil (LEVITRA) 20 MG tablet Take 20 mg by mouth daily as needed.      . venlafaxine XR (EFFEXOR XR) 150 MG 24 hr capsule Take 1 capsule (150 mg total) by mouth daily  with breakfast. 90 capsule 1   No current facility-administered medications on file prior to visit.    No Known Allergies Social History   Social History  . Marital status: Single    Spouse name: N/A  . Number of children: N/A  . Years of education: N/A   Occupational History  . Not on file.   Social History Main Topics  . Smoking status: Never Smoker  . Smokeless tobacco: Never Used  . Alcohol use 1.2 oz/week    2 Standard drinks or equivalent per week  . Drug use: No  . Sexual activity: Not on file   Other Topics Concern  . Not on file   Social History Narrative  . No narrative on file      Review of Systems  All other systems reviewed and are negative.      Objective:   Physical Exam  Constitutional: He appears well-developed and well-nourished. No distress.  HENT:  Mouth/Throat: No oropharyngeal exudate.  Eyes: Conjunctivae and EOM are normal.  Pupils are equal, round, and reactive to light. No scleral icterus.  Neck: Neck supple. No JVD present. No thyromegaly present.  Cardiovascular: Normal rate, regular rhythm and normal heart sounds.   No murmur heard. Pulmonary/Chest: Effort normal and breath sounds normal. No respiratory distress. He has no wheezes. He has no rales.  Abdominal: Soft. Bowel sounds are normal. He exhibits no distension. There is no tenderness. There is no rebound and no guarding.  Musculoskeletal: He exhibits no edema.  Lymphadenopathy:    He has no cervical adenopathy.  Skin: He is not diaphoretic.  Vitals reviewed.         Assessment & Plan:  Uncontrolled type 2 diabetes mellitus without complication, without long-term current use of insulin ([place]) - Plan: metFORMIN (GLUCOPHAGE) 500 MG tablet  We spent 20 minutes discussing his diet, lifestyle changes, and options for treatment. He has been eating a very high carbohydrate diet. I recommended less than 45 g of carbs per meal.  I recommended 30 minutes a day of aerobic exercise.  Discontinue all beer, sodas, and sweet tea. Reduce consumption of bread and potatoes and pasta. Increase Concepcin a fiber and green leafy vegetables. Add metformin 1000 mg by mouth twice a day. Recheck in 3 months

## 2015-11-17 ENCOUNTER — Other Ambulatory Visit: Payer: Self-pay | Admitting: Family Medicine

## 2015-11-17 DIAGNOSIS — E1165 Type 2 diabetes mellitus with hyperglycemia: Principal | ICD-10-CM

## 2015-11-17 DIAGNOSIS — IMO0001 Reserved for inherently not codable concepts without codable children: Secondary | ICD-10-CM

## 2015-11-17 MED ORDER — METFORMIN HCL 500 MG PO TABS: 1000.0000 mg | ORAL_TABLET | Freq: Two times a day (BID) | ORAL | 3 refills | Status: DC

## 2015-11-18 ENCOUNTER — Other Ambulatory Visit: Payer: Self-pay | Admitting: Family Medicine

## 2015-11-19 ENCOUNTER — Other Ambulatory Visit: Payer: Self-pay | Admitting: Family Medicine

## 2015-11-19 DIAGNOSIS — E1165 Type 2 diabetes mellitus with hyperglycemia: Principal | ICD-10-CM

## 2015-11-19 DIAGNOSIS — IMO0001 Reserved for inherently not codable concepts without codable children: Secondary | ICD-10-CM

## 2015-11-19 MED ORDER — METFORMIN HCL 500 MG PO TABS: 1000.0000 mg | ORAL_TABLET | Freq: Two times a day (BID) | ORAL | 3 refills | Status: DC

## 2015-11-26 ENCOUNTER — Encounter: Payer: Self-pay | Admitting: Family Medicine

## 2015-12-16 ENCOUNTER — Telehealth: Payer: Self-pay

## 2015-12-16 ENCOUNTER — Encounter: Payer: Self-pay | Admitting: Gastroenterology

## 2015-12-16 NOTE — Telephone Encounter (Signed)
Called patient back, had to lvm, I am not sure what packet of information was mailed to him. I did see that he has a pre-visit on 11/21 for upcoming colonoscopy. Asked that he arrive at 8:45 for that 9:00 appointment and check in on the 3rd floor.

## 2015-12-21 ENCOUNTER — Ambulatory Visit (AMBULATORY_SURGERY_CENTER): Payer: Self-pay | Admitting: *Deleted

## 2015-12-21 ENCOUNTER — Encounter: Payer: Self-pay | Admitting: Gastroenterology

## 2015-12-21 ENCOUNTER — Other Ambulatory Visit: Payer: Self-pay | Admitting: Family Medicine

## 2015-12-21 VITALS — Ht 71.0 in | Wt 213.0 lb

## 2015-12-21 DIAGNOSIS — Z1211 Encounter for screening for malignant neoplasm of colon: Secondary | ICD-10-CM

## 2015-12-21 MED ORDER — NA SULFATE-K SULFATE-MG SULF 17.5-3.13-1.6 GM/177ML PO SOLN: ORAL | 0 refills | Status: DC

## 2015-12-21 NOTE — Progress Notes (Signed)
Patient denies any allergies to eggs or soy. Patient denies any problems with anesthesia/sedation. Patient denies any oxygen use at home and does not take any diet/weight loss medications. Patient declined EMMI education. 

## 2016-01-04 ENCOUNTER — Encounter: Payer: Self-pay | Admitting: Gastroenterology

## 2016-01-04 ENCOUNTER — Ambulatory Visit (AMBULATORY_SURGERY_CENTER): Admitting: Gastroenterology

## 2016-01-04 VITALS — BP 102/54 | HR 60 | Temp 96.9°F | Resp 11 | Ht 71.0 in | Wt 213.0 lb

## 2016-01-04 DIAGNOSIS — Z1212 Encounter for screening for malignant neoplasm of rectum: Secondary | ICD-10-CM

## 2016-01-04 DIAGNOSIS — Z1211 Encounter for screening for malignant neoplasm of colon: Secondary | ICD-10-CM

## 2016-01-04 DIAGNOSIS — D122 Benign neoplasm of ascending colon: Secondary | ICD-10-CM

## 2016-01-04 MED ORDER — SODIUM CHLORIDE 0.9 % IV SOLN: 500.0000 mL | INTRAVENOUS | Status: DC

## 2016-01-04 NOTE — Op Note (Signed)
[facility] Patient Name: Mike Henderson Procedure Date: 01/04/2016 8:26 AM MRN: [id number] Endoscopist: Norris. Loletha Carrow , MD Age: 50 Referring MD:  Date of Birth: March 03, 1965 Gender: Male Account #: [id number] Procedure:                Colonoscopy Indications:              Screening for colorectal malignant neoplasm, This                            is the patient's first screening colonoscopy Medicines:                Monitored Anesthesia Care Procedure:                Pre-Anesthesia Assessment:                           - Prior to the procedure, a History and Physical                            was performed, and patient medications and                            allergies were reviewed. The patient's tolerance of                            previous anesthesia was also reviewed. The risks                            and benefits of the procedure and the sedation                            options and risks were discussed with the patient.                            All questions were answered, and informed consent                            was obtained. Prior Anticoagulants: The patient has                            taken no previous anticoagulant or antiplatelet                            agents. ASA Grade Assessment: II - A patient with                            mild systemic disease. After reviewing the risks                            and benefits, the patient was deemed in                            satisfactory condition to undergo the procedure.  After obtaining informed consent, the colonoscope                            was passed under direct vision. Throughout the                            procedure, the patient's blood pressure, pulse, and                            oxygen saturations were monitored continuously. The                            Model CF-HQ190L [id number]) scope was introduced                            through the  anus and advanced to the the cecum,                            identified by appendiceal orifice and ileocecal                            valve. The ileocecal valve, appendiceal orifice,                            and rectum were photographed. The quality of the                            bowel preparation was excellent. The colonoscopy                            was performed without difficulty. The patient                            tolerated the procedure well. The bowel preparation                            used was SUPREP. The quality of the bowel                            preparation was evaluated using the BBPS [facility]                            Bowel Preparation Scale) with scores of: Right                            Colon = 3, Transverse Colon = 3 and Left Colon = 3                            (entire mucosa seen well with no residual staining,                            small fragments of stool or opaque liquid). The  total BBPS score equals 9. Scope In: 8:33:04 AM Scope Out: 8:45:36 AM Scope Withdrawal Time: 0 hours 9 minutes 37 seconds  Total Procedure Duration: 0 hours 12 minutes 32 seconds  Findings:                 A 4 mm polyp was found in the mid ascending colon.                            The polyp was sessile. The polyp was removed with a                            cold snare. Resection and retrieval were complete.                           The exam was otherwise without abnormality on                            direct and retroflexion views. Complications:            No immediate complications. Estimated blood loss:                            None. Estimated Blood Loss:     Estimated blood loss: none. Impression:               - One 4 mm polyp in the mid ascending colon,                            removed with a cold snare. Resected and retrieved.                           - The examination was otherwise normal on direct                             and retroflexion views. Recommendation:           - Patient has a contact number available for                            emergencies. The signs and symptoms of potential                            delayed complications were discussed with the                            patient. Return to normal activities tomorrow.                            Written discharge instructions were provided to the                            patient.                           - Resume previous diet.                           -  Continue present medications.                           - Await pathology results.                           - Repeat colonoscopy is recommended for                            surveillance. The colonoscopy date will be                            determined after pathology results from today's                            exam become available for review. Hillel Card L. Loletha Carrow, MD 01/04/2016 8:49:19 AM This report has been signed electronically.

## 2016-01-04 NOTE — Progress Notes (Signed)
Called to room to assist during endoscopic procedure.  Patient ID and intended procedure confirmed with present staff. Received instructions for my participation in the procedure from the performing physician.  

## 2016-01-04 NOTE — Patient Instructions (Signed)
YOU HAD AN ENDOSCOPIC PROCEDURE TODAY AT [facility]:   Refer to the procedure report that was given to you for any specific questions about what was found during the examination.  If the procedure report does not answer your questions, please call your gastroenterologist to clarify.  If you requested that your care partner not be given the details of your procedure findings, then the procedure report has been included in a sealed envelope for you to review at your convenience later.  YOU SHOULD EXPECT: Some feelings of bloating in the abdomen. Passage of more gas than usual.  Walking can help get rid of the air that was put into your GI tract during the procedure and reduce the bloating. If you had a lower endoscopy (such as a colonoscopy or flexible sigmoidoscopy) you may notice spotting of blood in your stool or on the toilet paper. If you underwent a bowel prep for your procedure, you may not have a normal bowel movement for a few days.  Please Note:  You might notice some irritation and congestion in your nose or some drainage.  This is from the oxygen used during your procedure.  There is no need for concern and it should clear up in a day or so.  SYMPTOMS TO REPORT IMMEDIATELY:   Following lower endoscopy (colonoscopy or flexible sigmoidoscopy):  Excessive amounts of blood in the stool  Significant tenderness or worsening of abdominal pains  Swelling of the abdomen that is new, acute  Fever of 100F or higher   For urgent or emergent issues, a gastroenterologist can be reached at any hour by calling [phone].   DIET:  We do recommend a small meal at first, but then you may proceed to your regular diet.  Drink plenty of fluids but you should avoid alcoholic beverages for 24 hours.  ACTIVITY:  You should plan to take it easy for the rest of today and you should NOT DRIVE or use heavy machinery until tomorrow (because of the sedation medicines used during the test).     FOLLOW UP: Our staff will call the number listed on your records the next business day following your procedure to check on you and address any questions or concerns that you may have regarding the information given to you following your procedure. If we do not reach you, we will leave a message.  However, if you are feeling well and you are not experiencing any problems, there is no need to return our call.  We will assume that you have returned to your regular daily activities without incident.  If any biopsies were taken you will be contacted by phone or by letter within the next 1-3 weeks.  Please call us at [phone] if you have not heard about the biopsies in 3 weeks.    SIGNATURES/CONFIDENTIALITY: You and/or your care partner have signed paperwork which will be entered into your electronic medical record.  These signatures attest to the fact that that the information above on your After Visit Summary has been reviewed and is understood.  Full responsibility of the confidentiality of this discharge information lies with you and/or your care-partner.  Polyp-handout given  Repeat colonoscopy will be determined by pathology.

## 2016-01-04 NOTE — Progress Notes (Signed)
Report given to PACU RN, vss 

## 2016-01-05 ENCOUNTER — Telehealth: Payer: Self-pay | Admitting: *Deleted

## 2016-01-05 NOTE — Telephone Encounter (Signed)
  Follow up Call-  Call back number 01/04/2016  Post procedure Call Back phone  # [phone]  Permission to leave phone message Yes  Some recent data might be hidden     Patient questions:  Do you have a fever, pain , or abdominal swelling? No. Pain Score  0 *  Have you tolerated food without any problems? Yes.    Have you been able to return to your normal activities? Yes.    Do you have any questions about your discharge instructions: Diet   No. Medications  No. Follow up visit  No.  Do you have questions or concerns about your Care? No.  Actions: * If pain score is 4 or above: No action needed, pain <4.

## 2016-01-06 ENCOUNTER — Encounter: Payer: Self-pay | Admitting: Gastroenterology

## 2016-01-19 ENCOUNTER — Encounter: Payer: Self-pay | Admitting: Gastroenterology

## 2016-01-21 ENCOUNTER — Other Ambulatory Visit

## 2016-01-21 DIAGNOSIS — IMO0002 Reserved for concepts with insufficient information to code with codable children: Secondary | ICD-10-CM | POA: Insufficient documentation

## 2016-01-21 DIAGNOSIS — Z79899 Other long term (current) drug therapy: Secondary | ICD-10-CM

## 2016-01-21 DIAGNOSIS — E785 Hyperlipidemia, unspecified: Secondary | ICD-10-CM

## 2016-01-21 DIAGNOSIS — E291 Testicular hypofunction: Secondary | ICD-10-CM

## 2016-01-21 DIAGNOSIS — E1165 Type 2 diabetes mellitus with hyperglycemia: Secondary | ICD-10-CM

## 2016-01-21 LAB — LIPID PANEL
CHOL/HDL RATIO: 4.9 ratio (ref <5.0)
Cholesterol: 192 mg/dL (ref <200)
HDL: 39 mg/dL — AB (ref >40)
LDL CALC: 107 mg/dL — AB (ref <100)
TRIGLYCERIDES: 231 mg/dL — AB (ref <150)
VLDL: 46 mg/dL — AB (ref <30)

## 2016-01-21 LAB — COMPLETE METABOLIC PANEL WITH GFR
ALT: 15 U/L (ref 9–46)
AST: 13 U/L (ref 10–35)
Albumin: 4.3 g/dL (ref 3.6–5.1)
Alkaline Phosphatase: 86 U/L (ref 40–115)
BUN: 16 mg/dL (ref 7–25)
CALCIUM: 9.1 mg/dL (ref 8.6–10.3)
CHLORIDE: 101 mmol/L (ref 98–110)
CO2: 24 mmol/L (ref 20–31)
Creat: 0.77 mg/dL (ref 0.70–1.33)
Glucose, Bld: 188 mg/dL — ABNORMAL HIGH (ref 70–99)
POTASSIUM: 4.4 mmol/L (ref 3.5–5.3)
Sodium: 138 mmol/L (ref 135–146)
Total Bilirubin: 0.4 mg/dL (ref 0.2–1.2)
Total Protein: 6.9 g/dL (ref 6.1–8.1)

## 2016-01-21 LAB — HEMOGLOBIN A1C
HEMOGLOBIN A1C: 7.8 % — AB (ref <5.7)
MEAN PLASMA GLUCOSE: 177 mg/dL

## 2016-01-21 LAB — PSA: PSA: 0.5 ng/mL (ref <=4.0)

## 2016-01-22 LAB — TESTOSTERONE: TESTOSTERONE: 428 ng/dL (ref 250–827)

## 2016-01-26 ENCOUNTER — Telehealth: Payer: Self-pay | Admitting: Family Medicine

## 2016-01-26 MED ORDER — ALPRAZOLAM 0.5 MG PO TABS: ORAL_TABLET | ORAL | 2 refills | Status: DC

## 2016-01-26 NOTE — Telephone Encounter (Signed)
Requesting refill on Xanax - Ok to refill??       

## 2016-01-26 NOTE — Telephone Encounter (Signed)
ok 

## 2016-01-26 NOTE — Telephone Encounter (Signed)
Medication called/sent to requested pharmacy  

## 2016-01-27 ENCOUNTER — Ambulatory Visit (INDEPENDENT_AMBULATORY_CARE_PROVIDER_SITE_OTHER): Admitting: Family Medicine

## 2016-01-27 ENCOUNTER — Encounter: Payer: Self-pay | Admitting: Family Medicine

## 2016-01-27 VITALS — BP 110/64 | HR 64 | Temp 98.3°F | Resp 14 | Ht 71.0 in | Wt 214.0 lb

## 2016-01-27 DIAGNOSIS — E1100 Type 2 diabetes mellitus with hyperosmolarity without nonketotic hyperglycemic-hyperosmolar coma (NKHHC): Secondary | ICD-10-CM | POA: Diagnosis not present

## 2016-01-27 DIAGNOSIS — I10 Essential (primary) hypertension: Secondary | ICD-10-CM

## 2016-01-27 MED ORDER — EMPAGLIFLOZIN 25 MG PO TABS: 25.0000 mg | ORAL_TABLET | Freq: Every day | ORAL | 5 refills | Status: DC

## 2016-01-27 NOTE — Progress Notes (Signed)
Subjective:    Patient ID: Mike Henderson, male    DOB: Dec 31, 1965, 50 y.o.   MRN: [id number]  HPI  10/14/15 Patient is here today for a checkup. He is currently on losartan 25 mg a day for hypertension. He has lost 16 pounds since last office visit. His blood pressure is much better. May not require medication any further. Denies chest pain shortness of breath or dyspnea on exertion. He is also on simvastatin 20 mg a day for hyperlipidemia. He denies myalgias or right upper quadrant pain. He is overdue for prostate cancer screening and request a PSA. He is also overdue for colonoscopy. He is willing to allow me to schedule colonoscopy. He declines a flu shot today. He is requesting a refill on his Effexor which he uses for depression and anxiety. Fortunately the patient states that he is doing extremely well. He feels much better than he has over the last few years as his stress and anxiety have improved. He is trying exercise any better. I believe this is reflected in his weight loss.  At that time, my plan was: Blood pressures well controlled. Patient will temporarily discontinue losartan and we will monitor his blood pressure. As long as his blood pressure is less than 140/90, he may no longer require the medication due to slice all changes in weight loss. I will also check his cholesterol. His goal LDL cholesterol is less than 130. While checking lab work I will screen the patient for prostate cancer with a PSA. I will also schedule the patient for colonoscopy. He is due for colon cancer screening. He declines hepatitis C screening even though he has several tattoos. He declines a flu shot today  10/25/15 At that time, my plan was:  We spent 20 minutes discussing his diet, lifestyle changes, and options for treatment. He has been eating a very high carbohydrate diet. I recommended less than 45 g of carbs per meal.  I recommended 30 minutes a day of aerobic exercise. Discontinue all beer, sodas,  and sweet tea. Reduce consumption of bread and potatoes and pasta. Increase Concepcin a fiber and green leafy vegetables. Add metformin 1000 mg by mouth twice a day. Recheck in 3 months  01/27/16 Lab on 01/21/2016  Component Date Value Ref Range Status  . Cholesterol 01/21/2016 192  <200 mg/dL Final  . Triglycerides 01/21/2016 231* <150 mg/dL Final  . HDL 01/21/2016 39* >40 mg/dL Final  . Total CHOL/HDL Ratio 01/21/2016 4.9  <5.0 Ratio Final  . VLDL 01/21/2016 46* <30 mg/dL Final  . LDL Cholesterol 01/21/2016 107* <100 mg/dL Final  . Sodium 01/21/2016 138  135 - 146 mmol/L Final  . Potassium 01/21/2016 4.4  3.5 - 5.3 mmol/L Final  . Chloride 01/21/2016 101  98 - 110 mmol/L Final  . CO2 01/21/2016 24  20 - 31 mmol/L Final  . Glucose, Bld 01/21/2016 188* 70 - 99 mg/dL Final  . BUN 01/21/2016 16  7 - 25 mg/dL Final  . Creat 01/21/2016 0.77  0.70 - 1.33 mg/dL Final   Comment:   For patients > or = 51 years of age: The upper reference limit for Creatinine is approximately 13% higher for people identified as African-American.     . Total Bilirubin 01/21/2016 0.4  0.2 - 1.2 mg/dL Final  . Alkaline Phosphatase 01/21/2016 86  40 - 115 U/L Final  . AST 01/21/2016 13  10 - 35 U/L Final  . ALT 01/21/2016 15  9 - 46  U/L Final  . Total Protein 01/21/2016 6.9  6.1 - 8.1 g/dL Final  . Albumin 01/21/2016 4.3  3.6 - 5.1 g/dL Final  . Calcium 01/21/2016 9.1  8.6 - 10.3 mg/dL Final  . GFR, Est African American 01/21/2016 >89  >=60 mL/min Final  . GFR, Est Non African American 01/21/2016 >89  >=60 mL/min Final  . Hgb A1c MFr Bld 01/21/2016 7.8* <5.7 % Final   Comment:   For someone without known diabetes, a hemoglobin A1c value of 6.5% or greater indicates that they may have diabetes and this should be confirmed with a follow-up test.   For someone with known diabetes, a value <7% indicates that their diabetes is well controlled and a value greater than or equal to 7% indicates suboptimal  control. A1c targets should be individualized based on duration of diabetes, age, comorbid conditions, and other considerations.   Currently, no consensus exists for use of hemoglobin A1c for diagnosis of diabetes for children.     . Mean Plasma Glucose 01/21/2016 177  mg/dL Final  . PSA 01/21/2016 0.5  <=4.0 ng/mL Final   Comment:   The total PSA value from this assay system is standardized against the WHO standard. The test result will be approximately 20% lower when compared to the equimolar-standardized total PSA ([employer]). Comparison of serial PSA results should be interpreted with this fact in mind.   This test was performed using the Siemens chemiluminescent method. Values obtained from different assay methods cannot be used interchangeably. PSA levels, regardless of value, should not be interpreted as absolute evidence of the presence or absence of disease.     . Testosterone 01/22/2016 428  250 - 827 ng/dL Final  Patient's weight is stable. He has made drastic changes in his diet decreasing his consumption of carbohydrates. His hemoglobin A1c has fallen almost 2 points on metformin. Unfortunately is still above goal at 7.8. He also has elevations of his triglycerides and LDL cholesterol. He is here today to discuss further options. Past Medical History:  Diagnosis Date  . Anxiety    Panic attacks  . Chronic prostatitis   . Depression   . Diabetes mellitus without complication ([place])   . ED (erectile dysfunction)   . Epididymitis   . GERD (gastroesophageal reflux disease)   . Hyperlipidemia   . Migraines   . OSA (obstructive sleep apnea) 02/01/12   SLEEP STUDY [facility]  . RBBB    Past Surgical History:  Procedure Laterality Date  . COLONOSCOPY  2001   normal per pt. in [employer] [facility] [employer]  . SHOULDER ARTHROSCOPY  09/2005   left  . SHOULDER ARTHROSCOPY  01/2006   right  . UMBILICAL HERNIA REPAIR  01/2009  . UVULOPALATOPHARYNGOPLASTY  08/2001    Current Outpatient Prescriptions on File Prior to Visit  Medication Sig Dispense Refill  . ALPRAZolam (XANAX) 0.5 MG tablet TAKE 1 TABLET BY MOUTH AT BEDTIME AS NEEDED FOR SLEEP,ANXIETY 30 tablet 2  . lansoprazole (PREVACID) 30 MG capsule TAKE 1 CAPSULE DAILY BEFORE A MEAL 90 capsule 3  . metFORMIN (GLUCOPHAGE) 500 MG tablet Take 2 tablets (1,000 mg total) by mouth 2 (two) times daily with a meal. 360 tablet 3  . Multiple Vitamin (MULTIVITAMIN) tablet Take 1 tablet by mouth daily.    . simvastatin (ZOCOR) 20 MG tablet TAKE 1 TABLET AT BEDTIME 90 tablet 1  . testosterone cypionate (DEPOTESTOSTERONE CYPIONATE) 200 MG/ML injection INJECT 1ML INTO THE MUSCLE EVERY 14 DAYS 10 mL  3  . UNABLE TO FIND Med Name:CPAP THERAPHY    . vardenafil (LEVITRA) 20 MG tablet Take 20 mg by mouth daily as needed.      . venlafaxine XR (EFFEXOR-XR) 150 MG 24 hr capsule TAKE 1 CAPSULE DAILY WITH BREAKFAST 90 capsule 1   Current Facility-Administered Medications on File Prior to Visit  Medication Dose Route Frequency Provider Last Rate Last Dose  . 0.9 %  sodium chloride infusion  500 mL Intravenous Continuous Nelida Meuse III, MD       No Known Allergies Social History   Social History  . Marital status: Single    Spouse name: N/A  . Number of children: N/A  . Years of education: N/A   Occupational History  . Not on file.   Social History Main Topics  . Smoking status: Never Smoker  . Smokeless tobacco: Never Used  . Alcohol use 1.2 oz/week    2 Standard drinks or equivalent per week     Comment: socail 1 drink per week per pt  . Drug use: No  . Sexual activity: Not on file   Other Topics Concern  . Not on file   Social History Narrative  . No narrative on file      Review of Systems  All other systems reviewed and are negative.      Objective:   Physical Exam  Constitutional: He appears well-developed and well-nourished. No distress.  HENT:  Mouth/Throat: No oropharyngeal exudate.   Eyes: Conjunctivae and EOM are normal. Pupils are equal, round, and reactive to light. No scleral icterus.  Neck: Neck supple. No JVD present. No thyromegaly present.  Cardiovascular: Normal rate, regular rhythm and normal heart sounds.   No murmur heard. Pulmonary/Chest: Effort normal and breath sounds normal. No respiratory distress. He has no wheezes. He has no rales.  Abdominal: Soft. Bowel sounds are normal. He exhibits no distension. There is no tenderness. There is no rebound and no guarding.  Musculoskeletal: He exhibits no edema.  Lymphadenopathy:    He has no cervical adenopathy.  Skin: He is not diaphoretic.  Vitals reviewed.         Assessment & Plan:  Uncontrolled type 2 diabetes mellitus with hyperosmolarity without coma, without long-term current use of insulin (HCC) - Plan: empagliflozin (JARDIANCE) 25 MG TABS tablet  Benign essential HTN Although I'm very happy with the reduction in his blood sugars, the patient is still not quite at goal. Therefore together we decided to start him on Jardiance 25 mg a day continue metformin 1000 mg twice a day. His blood pressure is at goal. LDL cholesterol is still slightly high at 107. His goal is less than 100. He continues to want to try to work on diet exercise and weight loss. We will recheck this again in 3 months

## 2016-02-24 ENCOUNTER — Ambulatory Visit: Payer: Self-pay | Admitting: Family Medicine

## 2016-03-07 ENCOUNTER — Ambulatory Visit (INDEPENDENT_AMBULATORY_CARE_PROVIDER_SITE_OTHER): Admitting: Family Medicine

## 2016-03-07 ENCOUNTER — Encounter: Payer: Self-pay | Admitting: Family Medicine

## 2016-03-07 VITALS — BP 122/74 | HR 58 | Temp 97.4°F | Resp 14 | Ht 71.0 in | Wt 207.0 lb

## 2016-03-07 DIAGNOSIS — N451 Epididymitis: Secondary | ICD-10-CM

## 2016-03-07 MED ORDER — CIPROFLOXACIN HCL 500 MG PO TABS: 500.0000 mg | ORAL_TABLET | Freq: Two times a day (BID) | ORAL | 0 refills | Status: DC

## 2016-03-07 NOTE — Progress Notes (Signed)
Subjective:    Patient ID: Mike Henderson, male    DOB: Mar 13, 1965, 51 y.o.   MRN: [id number]  HPI Patient is a 51 year old white male who has a history of recurrent epididymitis and prostatitis. He's had this problem off and on since he was 51. He has been seen by urology while he was with the [facility] was treated successfully for exacerbations using either doxycycline or Cipro. He's been doing extremely well over the last few years but in the last month he has developed pain in his left testicle. On examination today the left testicle is normal in size however he is exquisitely tender to palpation over the epididymis. He is also complaining of pain in his rectum. He has pain with defecation. These have previously been symptom she is experienced with a prostate infection. His PSA was recently checked in December and was found to be 0.5 and therefore I have no concern about prostate cancer. Past Medical History:  Diagnosis Date  . Anxiety    Panic attacks  . Chronic prostatitis   . Depression   . Diabetes mellitus without complication ([place])   . ED (erectile dysfunction)   . Epididymitis   . GERD (gastroesophageal reflux disease)   . Hyperlipidemia   . Migraines   . OSA (obstructive sleep apnea) 02/01/12   SLEEP STUDY [facility]  . RBBB    Past Surgical History:  Procedure Laterality Date  . COLONOSCOPY  2001   normal per pt. in [employer] [facility] [employer]  . SHOULDER ARTHROSCOPY  09/2005   left  . SHOULDER ARTHROSCOPY  01/2006   right  . UMBILICAL HERNIA REPAIR  01/2009  . UVULOPALATOPHARYNGOPLASTY  08/2001   Current Outpatient Prescriptions on File Prior to Visit  Medication Sig Dispense Refill  . ALPRAZolam (XANAX) 0.5 MG tablet TAKE 1 TABLET BY MOUTH AT BEDTIME AS NEEDED FOR SLEEP,ANXIETY 30 tablet 2  . empagliflozin (JARDIANCE) 25 MG TABS tablet Take 25 mg by mouth daily. 30 tablet 5  . lansoprazole (PREVACID) 30 MG capsule TAKE 1 CAPSULE DAILY BEFORE A MEAL 90 capsule 3  .  metFORMIN (GLUCOPHAGE) 500 MG tablet Take 2 tablets (1,000 mg total) by mouth 2 (two) times daily with a meal. 360 tablet 3  . Multiple Vitamin (MULTIVITAMIN) tablet Take 1 tablet by mouth daily.    . simvastatin (ZOCOR) 20 MG tablet TAKE 1 TABLET AT BEDTIME 90 tablet 1  . testosterone cypionate (DEPOTESTOSTERONE CYPIONATE) 200 MG/ML injection INJECT 1ML INTO THE MUSCLE EVERY 14 DAYS 10 mL 3  . UNABLE TO FIND Med Name:CPAP THERAPHY    . vardenafil (LEVITRA) 20 MG tablet Take 20 mg by mouth daily as needed.      . venlafaxine XR (EFFEXOR-XR) 150 MG 24 hr capsule TAKE 1 CAPSULE DAILY WITH BREAKFAST 90 capsule 1   Current Facility-Administered Medications on File Prior to Visit  Medication Dose Route Frequency Provider Last Rate Last Dose  . 0.9 %  sodium chloride infusion  500 mL Intravenous Continuous Nelida Meuse III, MD       No Known Allergies Social History   Social History  . Marital status: Single    Spouse name: N/A  . Number of children: N/A  . Years of education: N/A   Occupational History  . Not on file.   Social History Main Topics  . Smoking status: Never Smoker  . Smokeless tobacco: Never Used  . Alcohol use 1.2 oz/week    2 Standard drinks or equivalent  per week     Comment: socail 1 drink per week per pt  . Drug use: No  . Sexual activity: Not on file   Other Topics Concern  . Not on file   Social History Narrative  . No narrative on file      Review of Systems  All other systems reviewed and are negative.      Objective:   Physical Exam  Cardiovascular: Normal rate, regular rhythm and normal heart sounds.   Pulmonary/Chest: Effort normal and breath sounds normal.  Abdominal: Hernia confirmed negative in the right inguinal area and confirmed negative in the left inguinal area.  Genitourinary: Penis normal. Right testis shows no mass, no swelling and no tenderness. Left testis shows tenderness. Left testis shows no mass and no swelling. Circumcised.    Lymphadenopathy:       Right: No inguinal adenopathy present.       Left: No inguinal adenopathy present.  Vitals reviewed.         Epididymitis - Plan: ciprofloxacin (CIPRO) 500 MG tablet  I believe the patient has epididymitis as well as prostatitis.  I'll treat the patient with Cipro 500 mg by mouth twice a day for 14 days. Recheck immediately if worsening or if no better in 2 weeks

## 2016-04-04 ENCOUNTER — Encounter: Payer: Self-pay | Admitting: Family Medicine

## 2016-04-06 MED ORDER — DOXYCYCLINE HYCLATE 100 MG PO TABS: 100.0000 mg | ORAL_TABLET | Freq: Two times a day (BID) | ORAL | 0 refills | Status: DC

## 2016-04-27 ENCOUNTER — Other Ambulatory Visit

## 2016-04-27 ENCOUNTER — Other Ambulatory Visit: Payer: Self-pay

## 2016-04-27 DIAGNOSIS — E1165 Type 2 diabetes mellitus with hyperglycemia: Secondary | ICD-10-CM

## 2016-04-27 DIAGNOSIS — E118 Type 2 diabetes mellitus with unspecified complications: Principal | ICD-10-CM

## 2016-04-28 ENCOUNTER — Ambulatory Visit: Payer: Self-pay | Admitting: Family Medicine

## 2016-04-28 LAB — HEMOGLOBIN A1C
HEMOGLOBIN A1C: 5.8 % — AB (ref <5.7)
Mean Plasma Glucose: 120 mg/dL

## 2016-05-01 ENCOUNTER — Encounter: Payer: Self-pay | Admitting: Family Medicine

## 2016-05-02 ENCOUNTER — Encounter: Payer: Self-pay | Admitting: Family Medicine

## 2016-05-02 ENCOUNTER — Ambulatory Visit (INDEPENDENT_AMBULATORY_CARE_PROVIDER_SITE_OTHER): Admitting: Family Medicine

## 2016-05-02 VITALS — BP 122/74 | HR 62 | Temp 97.9°F | Resp 14 | Ht 71.0 in | Wt 203.0 lb

## 2016-05-02 DIAGNOSIS — E119 Type 2 diabetes mellitus without complications: Secondary | ICD-10-CM

## 2016-05-02 DIAGNOSIS — I10 Essential (primary) hypertension: Secondary | ICD-10-CM | POA: Diagnosis not present

## 2016-05-02 DIAGNOSIS — E291 Testicular hypofunction: Secondary | ICD-10-CM | POA: Diagnosis not present

## 2016-05-02 NOTE — Progress Notes (Signed)
Subjective:    Patient ID: Mike Henderson, male    DOB: December 31, 1965, 51 y.o.   MRN: [id number]  HPI  10/14/15 Patient is here today for a checkup. He is currently on losartan 25 mg a day for hypertension. He has lost 16 pounds since last office visit. His blood pressure is much better. May not require medication any further. Denies chest pain shortness of breath or dyspnea on exertion. He is also on simvastatin 20 mg a day for hyperlipidemia. He denies myalgias or right upper quadrant pain. He is overdue for prostate cancer screening and request a PSA. He is also overdue for colonoscopy. He is willing to allow me to schedule colonoscopy. He declines a flu shot today. He is requesting a refill on his Effexor which he uses for depression and anxiety. Fortunately the patient states that he is doing extremely well. He feels much better than he has over the last few years as his stress and anxiety have improved. He is trying exercise any better. I believe this is reflected in his weight loss.  At that time, my plan was: Blood pressures well controlled. Patient will temporarily discontinue losartan and we will monitor his blood pressure. As long as his blood pressure is less than 140/90, he may no longer require the medication due to slice all changes in weight loss. I will also check his cholesterol. His goal LDL cholesterol is less than 130. While checking lab work I will screen the patient for prostate cancer with a PSA. I will also schedule the patient for colonoscopy. He is due for colon cancer screening. He declines hepatitis C screening even though he has several tattoos. He declines a flu shot today  10/25/15 At that time, my plan was:  We spent 20 minutes discussing his diet, lifestyle changes, and options for treatment. He has been eating a very high carbohydrate diet. I recommended less than 45 g of carbs per meal.  I recommended 30 minutes a day of aerobic exercise. Discontinue all beer, sodas,  and sweet tea. Reduce consumption of bread and potatoes and pasta. Increase Concepcin a fiber and green leafy vegetables. Add metformin 1000 mg by mouth twice a day. Recheck in 3 months  01/27/16 Patient's weight is stable. He has made drastic changes in his diet decreasing his consumption of carbohydrates. His hemoglobin A1c has fallen almost 2 points on metformin. Unfortunately is still above goal at 7.8. He also has elevations of his triglycerides and LDL cholesterol. He is here today to discuss further options.  At that time, my plan was: Although I'm very happy with the reduction in his blood sugars, the patient is still not quite at goal. Therefore together we decided to start him on Jardiance 25 mg a day continue metformin 1000 mg twice a day. His blood pressure is at goal. LDL cholesterol is still slightly high at 107. His goal is less than 100. He continues to want to try to work on diet exercise and weight loss. We will recheck this again in 3 months  05/02/16 Has worked very hard on his diet and exercise. Has lost 12 pounds. Hemoglobin A1c has dropped from 7.8-5.8. Is tolerating Jardiance well.  Denies bladder infections or yeast infections or jock itch. Denies dry mouth. Blood pressure today is excellent. Denies any chest pain shortness of breath or dyspnea on exertion. His try and exercise 5 days a week.   Past Medical History:  Diagnosis Date  . Anxiety  Panic attacks  . Chronic prostatitis   . Depression   . Diabetes mellitus without complication ([place])   . ED (erectile dysfunction)   . Epididymitis   . GERD (gastroesophageal reflux disease)   . Hyperlipidemia   . Migraines   . OSA (obstructive sleep apnea) 02/01/12   SLEEP STUDY [facility]  . RBBB    Past Surgical History:  Procedure Laterality Date  . COLONOSCOPY  2001   normal per pt. in [employer] [facility] [employer]  . SHOULDER ARTHROSCOPY  09/2005   left  . SHOULDER ARTHROSCOPY  01/2006   right  . UMBILICAL HERNIA  REPAIR  01/2009  . UVULOPALATOPHARYNGOPLASTY  08/2001   Current Outpatient Prescriptions on File Prior to Visit  Medication Sig Dispense Refill  . ALPRAZolam (XANAX) 0.5 MG tablet TAKE 1 TABLET BY MOUTH AT BEDTIME AS NEEDED FOR SLEEP,ANXIETY 30 tablet 2  . empagliflozin (JARDIANCE) 25 MG TABS tablet Take 25 mg by mouth daily. 30 tablet 5  . lansoprazole (PREVACID) 30 MG capsule TAKE 1 CAPSULE DAILY BEFORE A MEAL 90 capsule 3  . metFORMIN (GLUCOPHAGE) 500 MG tablet Take 2 tablets (1,000 mg total) by mouth 2 (two) times daily with a meal. 360 tablet 3  . Multiple Vitamin (MULTIVITAMIN) tablet Take 1 tablet by mouth daily.    . simvastatin (ZOCOR) 20 MG tablet TAKE 1 TABLET AT BEDTIME 90 tablet 1  . testosterone cypionate (DEPOTESTOSTERONE CYPIONATE) 200 MG/ML injection INJECT 1ML INTO THE MUSCLE EVERY 14 DAYS 10 mL 3  . UNABLE TO FIND Med Name:CPAP THERAPHY    . vardenafil (LEVITRA) 20 MG tablet Take 20 mg by mouth daily as needed.      . venlafaxine XR (EFFEXOR-XR) 150 MG 24 hr capsule TAKE 1 CAPSULE DAILY WITH BREAKFAST 90 capsule 1   No current facility-administered medications on file prior to visit.    No Known Allergies Social History   Social History  . Marital status: Single    Spouse name: N/A  . Number of children: N/A  . Years of education: N/A   Occupational History  . Not on file.   Social History Main Topics  . Smoking status: Never Smoker  . Smokeless tobacco: Never Used  . Alcohol use 1.2 oz/week    2 Standard drinks or equivalent per week     Comment: socail 1 drink per week per pt  . Drug use: No  . Sexual activity: Not on file   Other Topics Concern  . Not on file   Social History Narrative  . No narrative on file      Review of Systems  All other systems reviewed and are negative.      Objective:   Physical Exam  Constitutional: He appears well-developed and well-nourished. No distress.  HENT:  Mouth/Throat: No oropharyngeal exudate.  Eyes:  Conjunctivae and EOM are normal. Pupils are equal, round, and reactive to light. No scleral icterus.  Neck: Neck supple. No JVD present. No thyromegaly present.  Cardiovascular: Normal rate, regular rhythm and normal heart sounds.   No murmur heard. Pulmonary/Chest: Effort normal and breath sounds normal. No respiratory distress. He has no wheezes. He has no rales.  Abdominal: Soft. Bowel sounds are normal. He exhibits no distension. There is no tenderness. There is no rebound and no guarding.  Musculoskeletal: He exhibits no edema.  Lymphadenopathy:    He has no cervical adenopathy.  Skin: He is not diaphoretic.  Vitals reviewed.  Assessment & Plan:  Diabetes mellitus type 2 controlled  I am very proud of this patient. Since September of last year, he has dropped his A1c from 9.8-7.8-5.8. He has made drastic dietary changes, weight loss, and is exercising regularly.  I gave him the option of [place] jardiance.  However he would like to continue the medication for now and recheck all his lab work in June. In June he would also be due for fasting lipid panel, urine microalbumin, a testosterone level, and a PSA

## 2016-05-11 ENCOUNTER — Other Ambulatory Visit: Payer: Self-pay | Admitting: Family Medicine

## 2016-05-11 DIAGNOSIS — E1100 Type 2 diabetes mellitus with hyperosmolarity without nonketotic hyperglycemic-hyperosmolar coma (NKHHC): Secondary | ICD-10-CM

## 2016-05-11 MED ORDER — EMPAGLIFLOZIN 25 MG PO TABS: 25.0000 mg | ORAL_TABLET | Freq: Every day | ORAL | 3 refills | Status: DC

## 2016-06-06 ENCOUNTER — Other Ambulatory Visit: Payer: Self-pay | Admitting: Family Medicine

## 2016-07-07 ENCOUNTER — Other Ambulatory Visit: Payer: Self-pay | Admitting: Family Medicine

## 2016-07-27 ENCOUNTER — Telehealth: Payer: Self-pay | Admitting: Family Medicine

## 2016-07-27 NOTE — Telephone Encounter (Signed)
Ok to refill??      Xanax for mail order LOV 05/02/16 [facility] - 01/26/16 #30/2

## 2016-07-27 NOTE — Telephone Encounter (Signed)
[facility] requesting refill on Xanax. Emailed pt through [employer] to make sure this was correct and at that time will get it approved through [facility].

## 2016-07-28 MED ORDER — ALPRAZOLAM 0.5 MG PO TABS: ORAL_TABLET | ORAL | 0 refills | Status: DC

## 2016-07-28 NOTE — Telephone Encounter (Signed)
Faxed to [facility] order at [phone]

## 2016-07-28 NOTE — Telephone Encounter (Signed)
Okay to refill? 

## 2016-08-02 ENCOUNTER — Other Ambulatory Visit: Payer: Self-pay | Admitting: Family Medicine

## 2016-08-03 ENCOUNTER — Telehealth: Payer: Self-pay | Admitting: Family Medicine

## 2016-08-03 NOTE — Telephone Encounter (Signed)
ok 

## 2016-08-03 NOTE — Telephone Encounter (Signed)
Ok to refill??      ( I sent this to mail order last Friday but he will be out before they arrive.)

## 2016-08-03 NOTE — Telephone Encounter (Signed)
Needed authorization for Xanax refill - Authorization given.

## 2016-08-03 NOTE — Telephone Encounter (Signed)
Ben from [employer] with question about patients alprazolam  [phone]

## 2016-08-04 ENCOUNTER — Other Ambulatory Visit

## 2016-08-04 LAB — LIPID PANEL
CHOLESTEROL: 175 mg/dL (ref <200)
HDL: 53 mg/dL (ref >40)
LDL Cholesterol: 109 mg/dL — ABNORMAL HIGH (ref <100)
TRIGLYCERIDES: 63 mg/dL (ref <150)
Total CHOL/HDL Ratio: 3.3 Ratio (ref <5.0)
VLDL: 13 mg/dL (ref <30)

## 2016-08-04 LAB — COMPLETE METABOLIC PANEL WITH GFR
ALT: 14 U/L (ref 9–46)
AST: 14 U/L (ref 10–35)
Albumin: 4.5 g/dL (ref 3.6–5.1)
Alkaline Phosphatase: 80 U/L (ref 40–115)
BILIRUBIN TOTAL: 0.4 mg/dL (ref 0.2–1.2)
BUN: 16 mg/dL (ref 7–25)
CALCIUM: 9.3 mg/dL (ref 8.6–10.3)
CO2: 20 mmol/L (ref 20–31)
CREATININE: 0.74 mg/dL (ref 0.70–1.33)
Chloride: 106 mmol/L (ref 98–110)
GFR, Est Non African American: >89 mL/min (ref >=60)
Glucose, Bld: 116 mg/dL — ABNORMAL HIGH (ref 70–99)
Potassium: 4.6 mmol/L (ref 3.5–5.3)
Sodium: 142 mmol/L (ref 135–146)
Total Protein: 7 g/dL (ref 6.1–8.1)

## 2016-08-04 LAB — CBC WITH DIFFERENTIAL/PLATELET
BASOS PCT: 1 %
Basophils Absolute: 57 cells/uL (ref 0–200)
EOS ABS: 114 {cells}/uL (ref 15–500)
Eosinophils Relative: 2 %
HEMATOCRIT: 48.7 % (ref 38.5–50.0)
HEMOGLOBIN: 16.3 g/dL (ref 13.0–17.0)
LYMPHS PCT: 35 %
Lymphs Abs: 1995 cells/uL (ref 850–3900)
MCH: 31.2 pg (ref 27.0–33.0)
MCHC: 33.5 g/dL (ref 32.0–36.0)
MCV: 93.1 fL (ref 80.0–100.0)
MONO ABS: 570 {cells}/uL (ref 200–950)
MPV: 9.3 fL (ref 7.5–12.5)
Monocytes Relative: 10 %
Neutro Abs: 2964 cells/uL (ref 1500–7800)
Neutrophils Relative %: 52 %
Platelets: 275 10*3/uL (ref 140–400)
RBC: 5.23 MIL/uL (ref 4.20–5.80)
RDW: 13.3 % (ref 11.0–15.0)
WBC: 5.7 10*3/uL (ref 3.8–10.8)

## 2016-08-05 LAB — HEMOGLOBIN A1C
HEMOGLOBIN A1C: 5.6 % (ref <5.7)
MEAN PLASMA GLUCOSE: 114 mg/dL

## 2016-08-05 LAB — MICROALBUMIN, URINE: Microalb, Ur: 0.4 mg/dL

## 2016-08-05 LAB — TESTOSTERONE: Testosterone: 328 ng/dL (ref 250–827)

## 2016-08-05 LAB — PSA: PSA: 0.4 ng/mL (ref <=4.0)

## 2016-08-08 MED ORDER — ATORVASTATIN CALCIUM 40 MG PO TABS: 40.0000 mg | ORAL_TABLET | Freq: Every day | ORAL | 3 refills | Status: DC

## 2016-08-08 NOTE — Addendum Note (Signed)
Addended by: Shary Decamp B on: 08/08/2016 08:26 AM   Modules accepted: Orders

## 2016-08-25 ENCOUNTER — Ambulatory Visit: Payer: Self-pay | Admitting: Family Medicine

## 2016-08-31 ENCOUNTER — Other Ambulatory Visit: Payer: Self-pay | Admitting: Family Medicine

## 2016-10-11 ENCOUNTER — Encounter: Payer: Self-pay | Admitting: Family Medicine

## 2016-10-12 ENCOUNTER — Other Ambulatory Visit: Payer: Self-pay | Admitting: *Deleted

## 2016-10-12 DIAGNOSIS — E785 Hyperlipidemia, unspecified: Secondary | ICD-10-CM

## 2016-10-12 DIAGNOSIS — I10 Essential (primary) hypertension: Secondary | ICD-10-CM

## 2016-10-12 DIAGNOSIS — E119 Type 2 diabetes mellitus without complications: Secondary | ICD-10-CM

## 2016-11-06 ENCOUNTER — Other Ambulatory Visit

## 2016-11-06 DIAGNOSIS — E119 Type 2 diabetes mellitus without complications: Secondary | ICD-10-CM

## 2016-11-06 DIAGNOSIS — I10 Essential (primary) hypertension: Secondary | ICD-10-CM

## 2016-11-06 DIAGNOSIS — E785 Hyperlipidemia, unspecified: Secondary | ICD-10-CM

## 2016-11-07 ENCOUNTER — Encounter: Payer: Self-pay | Admitting: Family Medicine

## 2016-11-07 LAB — LIPID PANEL
CHOL/HDL RATIO: 3.1 (calc) (ref <5.0)
Cholesterol: 148 mg/dL (ref <200)
HDL: 47 mg/dL (ref >40)
LDL CHOLESTEROL (CALC): 79 mg/dL
NON-HDL CHOLESTEROL (CALC): 101 mg/dL (ref <130)
Triglycerides: 121 mg/dL (ref <150)

## 2016-11-07 LAB — HEMOGLOBIN A1C
HEMOGLOBIN A1C: 6 %{Hb} — AB (ref <5.7)
Mean Plasma Glucose: 126 (calc)
eAG (mmol/L): 7 (calc)

## 2016-11-07 LAB — COMPLETE METABOLIC PANEL WITH GFR
AG Ratio: 1.7 (calc) (ref 1.0–2.5)
ALBUMIN MSPROF: 4.3 g/dL (ref 3.6–5.1)
ALT: 15 U/L (ref 9–46)
AST: 11 U/L (ref 10–35)
Alkaline phosphatase (APISO): 92 U/L (ref 40–115)
BILIRUBIN TOTAL: 0.4 mg/dL (ref 0.2–1.2)
BUN / CREAT RATIO: 35 (calc) — AB (ref 6–22)
BUN: 24 mg/dL (ref 7–25)
CHLORIDE: 105 mmol/L (ref 98–110)
CO2: 26 mmol/L (ref 20–32)
Calcium: 9.3 mg/dL (ref 8.6–10.3)
Creat: 0.68 mg/dL — ABNORMAL LOW (ref 0.70–1.33)
GFR, EST AFRICAN AMERICAN: 127 mL/min/{1.73_m2} (ref >=60)
GFR, EST NON AFRICAN AMERICAN: 110 mL/min/{1.73_m2} (ref >=60)
Globulin: 2.6 g/dL (calc) (ref 1.9–3.7)
Glucose, Bld: 105 mg/dL — ABNORMAL HIGH (ref 65–99)
Potassium: 4.3 mmol/L (ref 3.5–5.3)
Sodium: 140 mmol/L (ref 135–146)
TOTAL PROTEIN: 6.9 g/dL (ref 6.1–8.1)

## 2016-11-07 LAB — CBC WITH DIFFERENTIAL/PLATELET
Basophils Absolute: 72 cells/uL (ref 0–200)
Basophils Relative: 1.1 %
EOS PCT: 2 %
Eosinophils Absolute: 130 cells/uL (ref 15–500)
HCT: 47.4 % (ref 38.5–50.0)
Hemoglobin: 16.4 g/dL (ref 13.2–17.1)
LYMPHS ABS: 1970 {cells}/uL (ref 850–3900)
MCH: 30.9 pg (ref 27.0–33.0)
MCHC: 34.6 g/dL (ref 32.0–36.0)
MCV: 89.4 fL (ref 80.0–100.0)
MPV: 9.7 fL (ref 7.5–12.5)
Monocytes Relative: 9 %
NEUTROS PCT: 57.6 %
Neutro Abs: 3744 cells/uL (ref 1500–7800)
PLATELETS: 274 10*3/uL (ref 140–400)
RBC: 5.3 10*6/uL (ref 4.20–5.80)
RDW: 12.2 % (ref 11.0–15.0)
TOTAL LYMPHOCYTE: 30.3 %
WBC mixed population: 585 cells/uL (ref 200–950)
WBC: 6.5 10*3/uL (ref 3.8–10.8)

## 2016-11-10 ENCOUNTER — Ambulatory Visit: Payer: Self-pay | Admitting: Family Medicine

## 2016-11-11 ENCOUNTER — Other Ambulatory Visit: Payer: Self-pay | Admitting: Family Medicine

## 2016-11-17 ENCOUNTER — Other Ambulatory Visit: Payer: Self-pay

## 2016-11-17 MED ORDER — ATORVASTATIN CALCIUM 40 MG PO TABS: 40.0000 mg | ORAL_TABLET | Freq: Every day | ORAL | 3 refills | Status: DC

## 2016-11-24 ENCOUNTER — Encounter: Payer: Self-pay | Admitting: Family Medicine

## 2016-11-24 ENCOUNTER — Ambulatory Visit (INDEPENDENT_AMBULATORY_CARE_PROVIDER_SITE_OTHER): Admitting: Family Medicine

## 2016-11-24 VITALS — BP 120/74 | HR 66 | Temp 98.2°F | Resp 14 | Ht 71.0 in | Wt 202.0 lb

## 2016-11-24 DIAGNOSIS — G8929 Other chronic pain: Secondary | ICD-10-CM

## 2016-11-24 DIAGNOSIS — M545 Low back pain: Secondary | ICD-10-CM | POA: Diagnosis not present

## 2016-11-24 DIAGNOSIS — E119 Type 2 diabetes mellitus without complications: Secondary | ICD-10-CM | POA: Diagnosis not present

## 2016-11-24 DIAGNOSIS — I10 Essential (primary) hypertension: Secondary | ICD-10-CM | POA: Diagnosis not present

## 2016-11-24 DIAGNOSIS — Z23 Encounter for immunization: Secondary | ICD-10-CM

## 2016-11-24 NOTE — Progress Notes (Signed)
Subjective:    Patient ID: Mike Henderson, male    DOB: Oct 20, 1965, 51 y.o.   MRN: [id number]  Medication Refill     10/14/15 Patient is here today for a checkup. He is currently on losartan 25 mg a day for hypertension. He has lost 16 pounds since last office visit. His blood pressure is much better. May not require medication any further. Denies chest pain shortness of breath or dyspnea on exertion. He is also on simvastatin 20 mg a day for hyperlipidemia. He denies myalgias or right upper quadrant pain. He is overdue for prostate cancer screening and request a PSA. He is also overdue for colonoscopy. He is willing to allow me to schedule colonoscopy. He declines a flu shot today. He is requesting a refill on his Effexor which he uses for depression and anxiety. Fortunately the patient states that he is doing extremely well. He feels much better than he has over the last few years as his stress and anxiety have improved. He is trying exercise any better. I believe this is reflected in his weight loss.  At that time, my plan was: Blood pressures well controlled. Patient will temporarily discontinue losartan and we will monitor his blood pressure. As long as his blood pressure is less than 140/90, he may no longer require the medication due to slice all changes in weight loss. I will also check his cholesterol. His goal LDL cholesterol is less than 130. While checking lab work I will screen the patient for prostate cancer with a PSA. I will also schedule the patient for colonoscopy. He is due for colon cancer screening. He declines hepatitis C screening even though he has several tattoos. He declines a flu shot today  10/25/15 At that time, my plan was:  We spent 20 minutes discussing his diet, lifestyle changes, and options for treatment. He has been eating a very high carbohydrate diet. I recommended less than 45 g of carbs per meal.  I recommended 30 minutes a day of aerobic exercise.  Discontinue all beer, sodas, and sweet tea. Reduce consumption of bread and potatoes and pasta. Increase Concepcin a fiber and green leafy vegetables. Add metformin 1000 mg by mouth twice a day. Recheck in 3 months  01/27/16 Patient's weight is stable. He has made drastic changes in his diet decreasing his consumption of carbohydrates. His hemoglobin A1c has fallen almost 2 points on metformin. Unfortunately is still above goal at 7.8. He also has elevations of his triglycerides and LDL cholesterol. He is here today to discuss further options.  At that time, my plan was: Although I'm very happy with the reduction in his blood sugars, the patient is still not quite at goal. Therefore together we decided to start him on Jardiance 25 mg a day continue metformin 1000 mg twice a day. His blood pressure is at goal. LDL cholesterol is still slightly high at 107. His goal is less than 100. He continues to want to try to work on diet exercise and weight loss. We will recheck this again in 3 months  05/02/16 Has worked very hard on his diet and exercise. Has lost 12 pounds. Hemoglobin A1c has dropped from 7.8-5.8. Is tolerating Jardiance well.  Denies bladder infections or yeast infections or jock itch. Denies dry mouth. Blood pressure today is excellent. Denies any chest pain shortness of breath or dyspnea on exertion. His try and exercise 5 days a week.  At that time, my plan was: I am very  proud of this patient. Since September of last year, he has dropped his A1c from 9.8-7.8-5.8. He has made drastic dietary changes, weight loss, and is exercising regularly.  I gave him the option of [place] jardiance.  However he would like to continue the medication for now and recheck all his lab work in June. In June he would also be due for fasting lipid panel, urine microalbumin, a testosterone level, and a PSA  11/24/16 Patient is here today for follow-up.  His blood pressure is excellent 120/74.  He denies any chest pain  shortness of breath or dyspnea on exertion.  He denies any polyuria, polydipsia, blurry vision.  He is maintaining his weight.  He is exercising regularly.  His most recent hemoglobin A1c was excellent at 6.  He denies any myalgias or right upper quadrant pain on Lipitor.  His LDL cholesterol is well below his goal of 100.  He is due for an annual diabetic eye exam but he would like to schedule that with Dr. Sabra Heck on his own.  His last eye exam was greater than a year ago.  He is due for the flu shot which he politely declines.  He is also due for Pneumovax 23 which he will except.  Diabetic foot exam was performed today and is normal.  His most recent lab work as listed below: Appointment on 11/06/2016  Component Date Value Ref Range Status  . WBC 11/06/2016 6.5  3.8 - 10.8 Thousand/uL Final  . RBC 11/06/2016 5.30  4.20 - 5.80 Million/uL Final  . Hemoglobin 11/06/2016 16.4  13.2 - 17.1 g/dL Final  . HCT 11/06/2016 47.4  38.5 - 50.0 % Final  . MCV 11/06/2016 89.4  80.0 - 100.0 fL Final  . MCH 11/06/2016 30.9  27.0 - 33.0 pg Final  . MCHC 11/06/2016 34.6  32.0 - 36.0 g/dL Final  . RDW 11/06/2016 12.2  11.0 - 15.0 % Final  . Platelets 11/06/2016 274  140 - 400 Thousand/uL Final  . MPV 11/06/2016 9.7  7.5 - 12.5 fL Final  . Neutro Abs 11/06/2016 3744  1,500 - 7,800 cells/uL Final  . Lymphs Abs 11/06/2016 1970  850 - 3,900 cells/uL Final  . WBC mixed population 11/06/2016 585  200 - 950 cells/uL Final  . Eosinophils Absolute 11/06/2016 130  15 - 500 cells/uL Final  . Basophils Absolute 11/06/2016 72  0 - 200 cells/uL Final  . Neutrophils Relative % 11/06/2016 57.6  % Final  . Total Lymphocyte 11/06/2016 30.3  % Final  . Monocytes Relative 11/06/2016 9.0  % Final  . Eosinophils Relative 11/06/2016 2.0  % Final  . Basophils Relative 11/06/2016 1.1  % Final  . Glucose, Bld 11/06/2016 105* 65 - 99 mg/dL Final   Comment: .            Fasting reference interval . For someone without known diabetes,  a glucose value between 100 and 125 mg/dL is consistent with prediabetes and should be confirmed with a follow-up test. .   . BUN 11/06/2016 24  7 - 25 mg/dL Final  . Creat 11/06/2016 0.68* 0.70 - 1.33 mg/dL Final   Comment: For patients >57 years of age, the reference limit for Creatinine is approximately 13% higher for people identified as African-American. .   . GFR, Est Non African American 11/06/2016 110  > OR = 60 mL/min/1.66m2 Final  . GFR, Est African American 11/06/2016 127  > OR = 60 mL/min/1.10m2 Final  . BUN/Creatinine Ratio 11/06/2016  35* 6 - 22 (calc) Final  . Sodium 11/06/2016 140  135 - 146 mmol/L Final  . Potassium 11/06/2016 4.3  3.5 - 5.3 mmol/L Final  . Chloride 11/06/2016 105  98 - 110 mmol/L Final  . CO2 11/06/2016 26  20 - 32 mmol/L Final  . Calcium 11/06/2016 9.3  8.6 - 10.3 mg/dL Final  . Total Protein 11/06/2016 6.9  6.1 - 8.1 g/dL Final  . Albumin 11/06/2016 4.3  3.6 - 5.1 g/dL Final  . Globulin 11/06/2016 2.6  1.9 - 3.7 g/dL (calc) Final  . AG Ratio 11/06/2016 1.7  1.0 - 2.5 (calc) Final  . Total Bilirubin 11/06/2016 0.4  0.2 - 1.2 mg/dL Final  . Alkaline phosphatase (APISO) 11/06/2016 92  40 - 115 U/L Final  . AST 11/06/2016 11  10 - 35 U/L Final  . ALT 11/06/2016 15  9 - 46 U/L Final  . Cholesterol 11/06/2016 148  <200 mg/dL Final  . HDL 11/06/2016 47  >40 mg/dL Final  . Triglycerides 11/06/2016 121  <150 mg/dL Final  . LDL Cholesterol (Calc) 11/06/2016 79  mg/dL (calc) Final   Comment: Reference range: <100 . Desirable range <100 mg/dL for primary prevention;   <70 mg/dL for patients with CHD or diabetic patients  with > or = 2 CHD risk factors. Marland Kitchen LDL-C is now calculated using the Martin-Hopkins  calculation, which is a validated novel method providing  better accuracy than the Friedewald equation in the  estimation of LDL-C.  Cresenciano Genre et al. Annamaria Helling. 9629;528(41): 2061-2068  (http://education.[facility].com/faq/FAQ164)   . Total  CHOL/HDL Ratio 11/06/2016 3.1  <5.0 (calc) Final  . Non-HDL Cholesterol (Calc) 11/06/2016 101  <130 mg/dL (calc) Final   Comment: For patients with diabetes plus 1 major ASCVD risk  factor, treating to a non-HDL-C goal of <100 mg/dL  (LDL-C of <70 mg/dL) is considered a therapeutic  option.   . Hgb A1c MFr Bld 11/06/2016 6.0* <5.7 % of total Hgb Final   Comment: For someone without known diabetes, a hemoglobin  A1c value between 5.7% and 6.4% is consistent with prediabetes and should be confirmed with a  follow-up test. . For someone with known diabetes, a value <7% indicates that their diabetes is well controlled. A1c targets should be individualized based on duration of diabetes, age, comorbid conditions, and other considerations. . This assay result is consistent with an increased risk of diabetes. . Currently, no consensus exists regarding use of hemoglobin A1c for diagnosis of diabetes for children. .   . Mean Plasma Glucose 11/06/2016 126  (calc) Final  . eAG (mmol/L) 11/06/2016 7.0  (calc) Final    Past Medical History:  Diagnosis Date  . Anxiety    Panic attacks  . Chronic prostatitis   . Depression   . Diabetes mellitus without complication ([place])   . ED (erectile dysfunction)   . Epididymitis   . GERD (gastroesophageal reflux disease)   . Hyperlipidemia   . Migraines   . OSA (obstructive sleep apnea) 02/01/12   SLEEP STUDY [facility]  . RBBB    Past Surgical History:  Procedure Laterality Date  . COLONOSCOPY  2001   normal per pt. in [facility]  . SHOULDER ARTHROSCOPY  09/2005   left  . SHOULDER ARTHROSCOPY  01/2006   right  . UMBILICAL HERNIA REPAIR  01/2009  . UVULOPALATOPHARYNGOPLASTY  08/2001   Current Outpatient Prescriptions on File Prior to Visit  Medication Sig Dispense Refill  . ALPRAZolam Duanne Moron)  0.5 MG tablet TAKE 1 TABLET BY MOUTH EVERY NIGHT AT BEDTIME AS NEEDED FOR ANXIETY/ SLEEP 30 tablet 0  . atorvastatin (LIPITOR) 40  MG tablet Take 1 tablet (40 mg total) by mouth daily. 90 tablet 3  . empagliflozin (JARDIANCE) 25 MG TABS tablet Take 25 mg by mouth daily. 90 tablet 3  . lansoprazole (PREVACID) 30 MG capsule TAKE 1 CAPSULE DAILY BEFORE A MEAL 90 capsule 3  . metFORMIN (GLUCOPHAGE) 500 MG tablet Take 2 tablets (1,000 mg total) by mouth 2 (two) times daily with a meal. 360 tablet 3  . Multiple Vitamin (MULTIVITAMIN) tablet Take 1 tablet by mouth daily.    Marland Kitchen testosterone cypionate (DEPOTESTOSTERONE CYPIONATE) 200 MG/ML injection INJECT 1ML INTO THE MUSCLE EVERY 14 DAYS 10 mL 3  . UNABLE TO FIND Med Name:CPAP THERAPHY    . vardenafil (LEVITRA) 20 MG tablet Take 20 mg by mouth daily as needed.      . venlafaxine XR (EFFEXOR-XR) 150 MG 24 hr capsule TAKE 1 CAPSULE DAILY WITH BREAKFAST 90 capsule 1   No current facility-administered medications on file prior to visit.    No Known Allergies Social History   Social History  . Marital status: Single    Spouse name: N/A  . Number of children: N/A  . Years of education: N/A   Occupational History  . Not on file.   Social History Main Topics  . Smoking status: Never Smoker  . Smokeless tobacco: Never Used  . Alcohol use 1.2 oz/week    2 Standard drinks or equivalent per week     Comment: socail 1 drink per week per pt  . Drug use: No  . Sexual activity: Not on file   Other Topics Concern  . Not on file   Social History Narrative  . No narrative on file      Review of Systems  All other systems reviewed and are negative.      Objective:   Physical Exam  Constitutional: He appears well-developed and well-nourished. No distress.  HENT:  Mouth/Throat: No oropharyngeal exudate.  Eyes: Pupils are equal, round, and reactive to light. Conjunctivae and EOM are normal. No scleral icterus.  Neck: Neck supple. No JVD present. No thyromegaly present.  Cardiovascular: Normal rate, regular rhythm and normal heart sounds.   No murmur  heard. Pulmonary/Chest: Effort normal and breath sounds normal. No respiratory distress. He has no wheezes. He has no rales.  Abdominal: Soft. Bowel sounds are normal. He exhibits no distension. There is no tenderness. There is no rebound and no guarding.  Musculoskeletal: He exhibits no edema.  Lymphadenopathy:    He has no cervical adenopathy.  Skin: He is not diaphoretic.  Vitals reviewed.         Assessment & Plan:  Controlled type 2 diabetes mellitus without complication, without long-term current use of insulin (HCC) - Plan: Microalbumin, urine  Chronic right-sided low back pain without sciatica - Plan: DG Lumbar Spine Complete  Need for prophylactic vaccination against Streptococcus pneumoniae (pneumococcus) - Plan: Pneumococcal polysaccharide vaccine 23-valent greater than or equal to 2yo subcutaneous/IM  Benign essential HTN  Patient's diabetes is well controlled.  I recommended an annual diabetic eye exam.  Diabetic foot exam is normal.  I will check a urine microalbumin as well.  His blood pressure is well controlled.  I will make no changes in his antihypertensive medication.  He does complain of some chronic right-sided lower back pain just above his right gluteus.  Therefore  I recommended that he go get an x-ray of his back to the we can evaluate further.  I believe this is chronic muscle pain and I would recommend physical therapy if his x-ray is normal.  His LDL cholesterol is well below 100 which is at goal.  Therefore I will make no changes in his Lipitor.  He refused a flu shot but he received Pneumovax 23

## 2016-11-25 LAB — MICROALBUMIN, URINE: MICROALB UR: 1 mg/dL

## 2016-11-26 ENCOUNTER — Encounter: Payer: Self-pay | Admitting: Family Medicine

## 2016-11-26 ENCOUNTER — Other Ambulatory Visit: Payer: Self-pay | Admitting: Family Medicine

## 2016-11-27 MED ORDER — ALPRAZOLAM 0.5 MG PO TABS: ORAL_TABLET | ORAL | 0 refills | Status: DC

## 2016-12-23 ENCOUNTER — Other Ambulatory Visit: Payer: Self-pay | Admitting: Family Medicine

## 2016-12-23 DIAGNOSIS — IMO0001 Reserved for inherently not codable concepts without codable children: Secondary | ICD-10-CM

## 2016-12-23 DIAGNOSIS — E1165 Type 2 diabetes mellitus with hyperglycemia: Principal | ICD-10-CM

## 2016-12-24 ENCOUNTER — Other Ambulatory Visit: Payer: Self-pay | Admitting: Family Medicine

## 2016-12-25 NOTE — Telephone Encounter (Signed)
Approved. #[id number]. 

## 2016-12-25 NOTE — Telephone Encounter (Signed)
Ok to refill??      LOV - 11/24/16 LRF - 11/27/16

## 2016-12-25 NOTE — Telephone Encounter (Signed)
Medication refilled per protocol. 

## 2017-01-02 ENCOUNTER — Ambulatory Visit: Payer: Self-pay | Admitting: Family Medicine

## 2017-01-02 ENCOUNTER — Ambulatory Visit
Admission: RE | Admit: 2017-01-02 | Discharge: 2017-01-02 | Disposition: A | Discharge status: Home / Self Care | Source: Ambulatory Visit | Attending: Family Medicine | Admitting: Family Medicine

## 2017-01-02 DIAGNOSIS — M545 Low back pain: Principal | ICD-10-CM

## 2017-01-02 DIAGNOSIS — G8929 Other chronic pain: Secondary | ICD-10-CM

## 2017-01-03 ENCOUNTER — Encounter: Payer: Self-pay | Admitting: Family Medicine

## 2017-01-03 DIAGNOSIS — M545 Low back pain: Secondary | ICD-10-CM

## 2017-01-07 DIAGNOSIS — M7542 Impingement syndrome of left shoulder: Secondary | ICD-10-CM | POA: Insufficient documentation

## 2017-01-16 NOTE — Telephone Encounter (Signed)
Referral was placed for [employer] approved facility

## 2017-01-19 ENCOUNTER — Encounter: Payer: Self-pay | Admitting: Family Medicine

## 2017-01-19 ENCOUNTER — Other Ambulatory Visit: Payer: Self-pay | Admitting: Family Medicine

## 2017-01-19 MED ORDER — DOXYCYCLINE HYCLATE 100 MG PO TABS: 100.0000 mg | ORAL_TABLET | Freq: Two times a day (BID) | ORAL | 0 refills | Status: DC

## 2017-02-14 LAB — HM DIABETES EYE EXAM

## 2017-02-21 ENCOUNTER — Other Ambulatory Visit: Payer: Self-pay | Admitting: Physician Assistant

## 2017-02-22 NOTE — Telephone Encounter (Signed)
Last OV 11/24/2016 Last refill 12/25/2016 Okay to refill?

## 2017-02-22 NOTE — Telephone Encounter (Signed)
He sees Dr. Dennard Schaumann. Forward to Dr. Dennard Schaumann.

## 2017-03-22 ENCOUNTER — Other Ambulatory Visit: Payer: Self-pay | Admitting: Family Medicine

## 2017-03-22 NOTE — Telephone Encounter (Signed)
Pt is requesting refill on Xanax   LOV: 11/24/16  LRF:   02/22/17

## 2017-03-29 ENCOUNTER — Encounter: Payer: Self-pay | Admitting: *Deleted

## 2017-04-17 ENCOUNTER — Encounter: Payer: Self-pay | Admitting: Family Medicine

## 2017-04-18 ENCOUNTER — Other Ambulatory Visit: Payer: Self-pay | Admitting: Family Medicine

## 2017-04-18 MED ORDER — TERBINAFINE HCL 250 MG PO TABS: 250.0000 mg | ORAL_TABLET | Freq: Every day | ORAL | 2 refills | Status: DC

## 2017-04-18 NOTE — Telephone Encounter (Signed)
Ok to refill??  Last office visit 11/24/2016.  Last refill 03/22/2017.

## 2017-05-14 ENCOUNTER — Other Ambulatory Visit: Payer: Self-pay | Admitting: Family Medicine

## 2017-05-14 NOTE — Telephone Encounter (Signed)
Pt is requesting refill on Xanax   LOV:  11/24/16  LRF:  04/19/17

## 2017-05-18 ENCOUNTER — Other Ambulatory Visit: Payer: Self-pay | Admitting: Family Medicine

## 2017-05-18 DIAGNOSIS — E1100 Type 2 diabetes mellitus with hyperosmolarity without nonketotic hyperglycemic-hyperosmolar coma (NKHHC): Secondary | ICD-10-CM

## 2017-06-21 ENCOUNTER — Other Ambulatory Visit: Payer: Self-pay | Admitting: *Deleted

## 2017-06-21 NOTE — Telephone Encounter (Signed)
Received fax requesting refill on Xanax.   Ok to refill??  Last office visit 11/24/2016.  Last refill 05/15/2017.

## 2017-06-22 MED ORDER — ALPRAZOLAM 0.5 MG PO TABS: 0.5000 mg | ORAL_TABLET | Freq: Every evening | ORAL | 0 refills | Status: DC | PRN

## 2017-07-19 ENCOUNTER — Other Ambulatory Visit: Payer: Self-pay | Admitting: Family Medicine

## 2017-07-20 NOTE — Telephone Encounter (Signed)
Pt is requesting refill on Xanax   LOV:  11/24/16  LRF:   06/22/17

## 2017-08-07 DIAGNOSIS — M75112 Incomplete rotator cuff tear or rupture of left shoulder, not specified as traumatic: Secondary | ICD-10-CM | POA: Insufficient documentation

## 2017-08-19 ENCOUNTER — Encounter: Payer: Self-pay | Admitting: Family Medicine

## 2017-08-20 ENCOUNTER — Other Ambulatory Visit: Payer: Self-pay | Admitting: Family Medicine

## 2017-08-20 MED ORDER — DOXYCYCLINE HYCLATE 100 MG PO TABS: 100.0000 mg | ORAL_TABLET | Freq: Two times a day (BID) | ORAL | 0 refills | Status: DC

## 2017-08-31 ENCOUNTER — Other Ambulatory Visit: Payer: Self-pay | Admitting: Family Medicine

## 2017-08-31 DIAGNOSIS — E1165 Type 2 diabetes mellitus with hyperglycemia: Principal | ICD-10-CM

## 2017-08-31 DIAGNOSIS — IMO0001 Reserved for inherently not codable concepts without codable children: Secondary | ICD-10-CM

## 2017-09-13 ENCOUNTER — Encounter: Payer: Self-pay | Admitting: Family Medicine

## 2017-09-18 ENCOUNTER — Ambulatory Visit (INDEPENDENT_AMBULATORY_CARE_PROVIDER_SITE_OTHER): Admitting: Family Medicine

## 2017-09-18 ENCOUNTER — Encounter: Payer: Self-pay | Admitting: Family Medicine

## 2017-09-18 VITALS — BP 120/78 | HR 68 | Temp 97.8°F | Resp 14 | Ht 71.0 in | Wt 200.0 lb

## 2017-09-18 DIAGNOSIS — Z125 Encounter for screening for malignant neoplasm of prostate: Secondary | ICD-10-CM | POA: Diagnosis not present

## 2017-09-18 DIAGNOSIS — N451 Epididymitis: Secondary | ICD-10-CM

## 2017-09-18 MED ORDER — CIPROFLOXACIN HCL 500 MG PO TABS: 500.0000 mg | ORAL_TABLET | Freq: Two times a day (BID) | ORAL | 0 refills | Status: DC

## 2017-09-18 NOTE — Progress Notes (Signed)
Subjective:    Patient ID: Mike Henderson, male    DOB: December 12, 1965, 52 y.o.   MRN: [id number]  HPI 03/07/2016 Patient is a 52 year old white male who has a history of recurrent epididymitis and prostatitis. He's had this problem off and on since he was 24. He has been seen by urology while he was with the [facility] was treated successfully for exacerbations using either doxycycline or Cipro. He's been doing extremely well over the last few years but in the last month he has developed pain in his left testicle. On examination today the left testicle is normal in size however he is exquisitely tender to palpation over the epididymis. He is also complaining of pain in his rectum. He has pain with defecation. These have previously been symptom she is experienced with a prostate infection. His PSA was recently checked in December and was found to be 0.5 and therefore I have no concern about prostate cancer.  At that time, my plan was: I believe the patient has epididymitis as well as prostatitis.  I'll treat the patient with Cipro 500 mg by mouth twice a day for 14 days. Recheck immediately if worsening or if no better in 2 weeks  09/18/17 Patient states that he has been battling this off and on since 52 years old.  His most recent exacerbation occurred about 6 weeks ago.  Via email we treated it with doxycycline which is worked in the past.  He says within taking the doxycycline for a few days, the pain improved however never completely went away.  The pain is constant.  It is located in the left testicle.  Testicular exam was performed today, and the patient is exquisitely tender to palpation at the epididymis.  He also reports pain with defecation.  He reports a change in bowel movements.  He states that it is "like passing Play-doh" through a straw when he tries to defecate.  This raises concern about possible prostate infection.  He also has a family history of prostate cancer.  He denies any fevers or  chills.  There is very little chance of STI.  His colonoscopy is up-to-date. Past Medical History:  Diagnosis Date  . Anxiety    Panic attacks  . Chronic prostatitis   . Depression   . Diabetes mellitus without complication ([place])   . ED (erectile dysfunction)   . Epididymitis   . GERD (gastroesophageal reflux disease)   . Hyperlipidemia   . Migraines   . OSA (obstructive sleep apnea) 02/01/12   SLEEP STUDY [facility]  . RBBB    Past Surgical History:  Procedure Laterality Date  . COLONOSCOPY  2001   normal per pt. in [facility]  . SHOULDER ARTHROSCOPY  09/2005   left  . SHOULDER ARTHROSCOPY  01/2006   right  . UMBILICAL HERNIA REPAIR  01/2009  . UVULOPALATOPHARYNGOPLASTY  08/2001   Current Outpatient Medications on File Prior to Visit  Medication Sig Dispense Refill  . ALPRAZolam (XANAX) 0.5 MG tablet TAKE 1 TABLET(0.5 MG) BY MOUTH AT BEDTIME AS NEEDED FOR ANXIETY 30 tablet 2  . atorvastatin (LIPITOR) 40 MG tablet Take 1 tablet (40 mg total) by mouth daily. 90 tablet 3  . JARDIANCE 25 MG TABS tablet TAKE 1 TABLET DAILY 90 tablet 3  . lansoprazole (PREVACID) 30 MG capsule TAKE 1 CAPSULE DAILY BEFORE A MEAL 90 capsule 4  . metFORMIN (GLUCOPHAGE) 500 MG tablet TAKE 2 TABLETS (1000 MG TOTAL) TWICE A DAY  WITH A MEAL 360 tablet 4  . Multiple Vitamin (MULTIVITAMIN) tablet Take 1 tablet by mouth daily.    Marland Kitchen testosterone cypionate (DEPOTESTOSTERONE CYPIONATE) 200 MG/ML injection INJECT 1ML INTO THE MUSCLE EVERY 14 DAYS 10 mL 3  . UNABLE TO FIND Med Name:CPAP THERAPHY    . vardenafil (LEVITRA) 20 MG tablet Take 20 mg by mouth daily as needed.      . venlafaxine XR (EFFEXOR-XR) 150 MG 24 hr capsule TAKE 1 CAPSULE DAILY WITH BREAKFAST 90 capsule 3   No current facility-administered medications on file prior to visit.    No Known Allergies Social History   Socioeconomic History  . Marital status: Single    Spouse name: Not on file  . Number of children: Not on file    . Years of education: Not on file  . Highest education level: Not on file  Occupational History  . Not on file  Social Needs  . Financial resource strain: Not on file  . Food insecurity:    Worry: Not on file    Inability: Not on file  . Transportation needs:    Medical: Not on file    Non-medical: Not on file  Tobacco Use  . Smoking status: Never Smoker  . Smokeless tobacco: Never Used  Substance and Sexual Activity  . Alcohol use: Yes    Alcohol/week: 2.0 standard drinks    Types: 2 Standard drinks or equivalent per week    Comment: socail 1 drink per week per pt  . Drug use: No  . Sexual activity: Not on file  Lifestyle  . Physical activity:    Days per week: Not on file    Minutes per session: Not on file  . Stress: Not on file  Relationships  . Social connections:    Talks on phone: Not on file    Gets together: Not on file    Attends religious service: Not on file    Active member of club or organization: Not on file    Attends meetings of clubs or organizations: Not on file    Relationship status: Not on file  . Intimate partner violence:    Fear of current or ex partner: Not on file    Emotionally abused: Not on file    Physically abused: Not on file    Forced sexual activity: Not on file  Other Topics Concern  . Not on file  Social History Narrative  . Not on file      Review of Systems  All other systems reviewed and are negative.      Objective:   Physical Exam  Cardiovascular: Normal rate, regular rhythm and normal heart sounds.  Pulmonary/Chest: Effort normal and breath sounds normal.  Abdominal: Hernia confirmed negative in the right inguinal area and confirmed negative in the left inguinal area.  Genitourinary: Penis normal. Right testis shows no mass, no swelling and no tenderness. Left testis shows tenderness. Left testis shows no mass and no swelling. Circumcised.  Lymphadenopathy:       Right: No inguinal adenopathy present.        Left: No inguinal adenopathy present.  Vitals reviewed.        Left testicular pain Prostate cancer screening - Plan: PSA  I am not convinced that this is epididymitis although he is extremely tender to palpation at the epididymis.  I will check a PSA given his history of his father having prostate cancer in his age.  He is overdue for  prostate cancer screening.  I will treat the patient empirically for epididymitis using Cipro 500 mg p.o. twice daily for 10 days to cover E. coli and enteric pathogens which may be the doxycycline was not effectively covering.  If the pain improved dramatically quickly on Cipro this would be the most likely diagnosis.  However he continues to have pain in the left testicle despite taking Cipro, I am concerned about possible neuropathic pain.  I would recommend an ultrasound of the scrotum to evaluate the testicle further and a referral to urology for a second opinion.

## 2017-09-19 LAB — PSA: PSA: 0.5 ng/mL (ref <=4.0)

## 2017-10-23 ENCOUNTER — Other Ambulatory Visit: Payer: Self-pay | Admitting: Family Medicine

## 2017-10-25 NOTE — Telephone Encounter (Signed)
Pt is requesting refill on Xanax   LOV: 09/18/17 LRF: 07/20/17

## 2017-11-25 ENCOUNTER — Other Ambulatory Visit: Payer: Self-pay | Admitting: Family Medicine

## 2017-11-26 ENCOUNTER — Other Ambulatory Visit: Payer: Self-pay | Admitting: Family Medicine

## 2017-11-26 NOTE — Telephone Encounter (Signed)
Last OV 09/18/2017 Last refill 10/25/2017 Ok to refill?

## 2017-12-18 ENCOUNTER — Encounter: Payer: Self-pay | Admitting: Family Medicine

## 2017-12-18 ENCOUNTER — Other Ambulatory Visit: Payer: Self-pay | Admitting: Family Medicine

## 2017-12-18 DIAGNOSIS — N451 Epididymitis: Secondary | ICD-10-CM

## 2017-12-18 MED ORDER — CIPROFLOXACIN HCL 500 MG PO TABS: 500.0000 mg | ORAL_TABLET | Freq: Two times a day (BID) | ORAL | 0 refills | Status: DC

## 2017-12-25 ENCOUNTER — Other Ambulatory Visit: Payer: Self-pay | Admitting: Family Medicine

## 2017-12-26 NOTE — Telephone Encounter (Signed)
Pt is requesting refill on Xanax   LOV: 09/18/17  LRF:   11/26/17

## 2018-01-23 ENCOUNTER — Other Ambulatory Visit: Payer: Self-pay | Admitting: Family Medicine

## 2018-01-24 NOTE — Telephone Encounter (Signed)
Ok to refill??  Last office visit 09/18/2017  Last refill 12/26/2017.

## 2018-02-04 ENCOUNTER — Ambulatory Visit (INDEPENDENT_AMBULATORY_CARE_PROVIDER_SITE_OTHER): Admitting: Family Medicine

## 2018-02-04 ENCOUNTER — Encounter: Payer: Self-pay | Admitting: Family Medicine

## 2018-02-04 VITALS — BP 130/90 | HR 86 | Temp 97.7°F | Resp 14 | Ht 71.0 in | Wt 212.0 lb

## 2018-02-04 DIAGNOSIS — R5382 Chronic fatigue, unspecified: Secondary | ICD-10-CM | POA: Diagnosis not present

## 2018-02-04 NOTE — Progress Notes (Signed)
Subjective:    Patient ID: Mike Henderson, male    DOB: [birth year], 53 y.o.   MRN: [id number]  Medication Refill     10/14/15 Patient is here today for a checkup. He is currently on losartan 25 mg a day for hypertension. He has lost 16 pounds since last office visit. His blood pressure is much better. May not require medication any further. Denies chest pain shortness of breath or dyspnea on exertion. He is also on simvastatin 20 mg a day for hyperlipidemia. He denies myalgias or right upper quadrant pain. He is overdue for prostate cancer screening and request a PSA. He is also overdue for colonoscopy. He is willing to allow me to schedule colonoscopy. He declines a flu shot today. He is requesting a refill on his Effexor which he uses for depression and anxiety. Fortunately the patient states that he is doing extremely well. He feels much better than he has over the last few years as his stress and anxiety have improved. He is trying exercise any better. I believe this is reflected in his weight loss.  At that time, my plan was: Blood pressures well controlled. Patient will temporarily discontinue losartan and we will monitor his blood pressure. As long as his blood pressure is less than 140/90, he may no longer require the medication due to slice all changes in weight loss. I will also check his cholesterol. His goal LDL cholesterol is less than 130. While checking lab work I will screen the patient for prostate cancer with a PSA. I will also schedule the patient for colonoscopy. He is due for colon cancer screening. He declines hepatitis C screening even though he has several tattoos. He declines a flu shot today  10/25/15 At that time, my plan was:  We spent 20 minutes discussing his diet, lifestyle changes, and options for treatment. He has been eating a very high carbohydrate diet. I recommended less than 45 g of carbs per meal.  I recommended 30 minutes a day of aerobic exercise.  Discontinue all beer, sodas, and sweet tea. Reduce consumption of bread and potatoes and pasta. Increase Concepcin a fiber and green leafy vegetables. Add metformin 1000 mg by mouth twice a day. Recheck in 3 months  01/27/16 Patient's weight is stable. He has made drastic changes in his diet decreasing his consumption of carbohydrates. His hemoglobin A1c has fallen almost 2 points on metformin. Unfortunately is still above goal at 7.8. He also has elevations of his triglycerides and LDL cholesterol. He is here today to discuss further options.  At that time, my plan was: Although I'm very happy with the reduction in his blood sugars, the patient is still not quite at goal. Therefore together we decided to start him on Jardiance 25 mg a day continue metformin 1000 mg twice a day. His blood pressure is at goal. LDL cholesterol is still slightly high at 107. His goal is less than 100. He continues to want to try to work on diet exercise and weight loss. We will recheck this again in 3 months  05/02/16 Has worked very hard on his diet and exercise. Has lost 12 pounds. Hemoglobin A1c has dropped from 7.8-5.8. Is tolerating Jardiance well.  Denies bladder infections or yeast infections or jock itch. Denies dry mouth. Blood pressure today is excellent. Denies any chest pain shortness of breath or dyspnea on exertion. His try and exercise 5 days a week.  At that time, my plan was: I am very  proud of this patient. Since September of last year, he has dropped his A1c from 9.8-7.8-5.8. He has made drastic dietary changes, weight loss, and is exercising regularly.  I gave him the option of [place] jardiance.  However he would like to continue the medication for now and recheck all his lab work in June. In June he would also be due for fasting lipid panel, urine microalbumin, a testosterone level, and a PSA  Patient presents today wanting to come off his Effexor.  However when I question why, the patient reports feeling  melancholy.  He reports feeling tired.  He reports feeling like he has no energy or desire to do anything.  He has a history of hypogonadism however he is not taking his testosterone.  He is taking it randomly but not consistently.  He denies any chest pain shortness of breath or dyspnea on exertion.  He does complain of erectile dysfunction and poor libido. Past Medical History:  Diagnosis Date  . Anxiety    Panic attacks  . Chronic prostatitis   . Depression   . Diabetes mellitus without complication ([place])   . ED (erectile dysfunction)   . Epididymitis   . GERD (gastroesophageal reflux disease)   . Hyperlipidemia   . Migraines   . OSA (obstructive sleep apnea) 02/01/12   SLEEP STUDY [facility]  . RBBB    Past Surgical History:  Procedure Laterality Date  . COLONOSCOPY  2001   normal per pt. in [facility] [employer] [facility] [employer]  . SHOULDER ARTHROSCOPY  09/2005   left  . SHOULDER ARTHROSCOPY  01/2006   right  . UMBILICAL HERNIA REPAIR  01/2009  . UVULOPALATOPHARYNGOPLASTY  08/2001   Current Outpatient Medications on File Prior to Visit  Medication Sig Dispense Refill  . ALPRAZolam (XANAX) 0.5 MG tablet TAKE 1 TABLET(0.5 MG) BY MOUTH AT BEDTIME AS NEEDED FOR ANXIETY 30 tablet 0  . atorvastatin (LIPITOR) 40 MG tablet TAKE 1 TABLET DAILY 90 tablet 4  . JARDIANCE 25 MG TABS tablet TAKE 1 TABLET DAILY 90 tablet 3  . lansoprazole (PREVACID) 30 MG capsule TAKE 1 CAPSULE DAILY BEFORE A MEAL 90 capsule 4  . metFORMIN (GLUCOPHAGE) 500 MG tablet TAKE 2 TABLETS (1000 MG TOTAL) TWICE A DAY WITH A MEAL 360 tablet 4  . Multiple Vitamin (MULTIVITAMIN) tablet Take 1 tablet by mouth daily.    [facility] testosterone cypionate (DEPOTESTOSTERONE CYPIONATE) 200 MG/ML injection INJECT 1ML INTO THE MUSCLE EVERY 14 DAYS 10 mL 3  . UNABLE TO FIND Med Name:CPAP THERAPHY    . vardenafil (LEVITRA) 20 MG tablet Take 20 mg by mouth daily as needed.      . venlafaxine XR (EFFEXOR-XR) 150 MG 24 hr capsule TAKE 1 CAPSULE DAILY  WITH BREAKFAST 90 capsule 3   No current facility-administered medications on file prior to visit.    No Known Allergies Social History   Socioeconomic History  . Marital status: Married    Spouse name: Not on file  . Number of children: Not on file  . Years of education: Not on file  . Highest education level: Not on file  Occupational History  . Not on file  Social Needs  . Financial resource strain: Not on file  . Food insecurity:    Worry: Not on file    Inability: Not on file  . Transportation needs:    Medical: Not on file    Non-medical: Not on file  Tobacco Use  . Smoking status: Never Smoker  .  Smokeless tobacco: Never Used  Substance and Sexual Activity  . Alcohol use: Yes    Alcohol/week: 2.0 standard drinks    Types: 2 Standard drinks or equivalent per week    Comment: socail 1 drink per week per pt  . Drug use: No  . Sexual activity: Not on file  Lifestyle  . Physical activity:    Days per week: Not on file    Minutes per session: Not on file  . Stress: Not on file  Relationships  . Social connections:    Talks on phone: Not on file    Gets together: Not on file    Attends religious service: Not on file    Active member of club or organization: Not on file    Attends meetings of clubs or organizations: Not on file    Relationship status: Not on file  . Intimate partner violence:    Fear of current or ex partner: Not on file    Emotionally abused: Not on file    Physically abused: Not on file    Forced sexual activity: Not on file  Other Topics Concern  . Not on file  Social History Narrative  . Not on file      Review of Systems  All other systems reviewed and are negative.      Objective:   Physical Exam  Constitutional: He appears well-developed and well-nourished. No distress.  HENT:  Mouth/Throat: No oropharyngeal exudate.  Eyes: Pupils are equal, round, and reactive to light. Conjunctivae and EOM are normal. No scleral icterus.    Neck: Neck supple. No JVD present.  Cardiovascular: Normal rate, regular rhythm and normal heart sounds.  No murmur heard. Pulmonary/Chest: Effort normal and breath sounds normal. No respiratory distress. He has no wheezes. He has no rales.  Skin: He is not diaphoretic.  Vitals reviewed.         Assessment & Plan:  Chronic fatigue - Plan: CBC with Differential/Platelet, COMPLETE METABOLIC PANEL WITH GFR, TSH, Hemoglobin A1c, Testosterone Total,Free,Bio, Males, Vitamin B12  Vast majority of our visit today was spent in discussion teasing out exactly what his symptoms are.  Patient has fatigue.  He has melancholy.  He has lack of energy, he has lack of desire, he has poor libido, and he has erectile dysfunction.  I believe this is a combination of poorly controlled depression as well as hypergonadism.  I recommended starting by checking a baseline laboratory panel to determine exactly his metabolic parameters.  This will include a CBC, CMP, TSH, A1c, testosterone level, and vitamin B12.  If labs are within normal limits, I would focus our attention on better treating his depression.  I would switch his venlafaxine to Trintellix.  Previously he had tried Zoloft but that medication lost its effectiveness and that is why he was switched to venlafaxine in the first place.  He has been on this medication for many years and he may be developing a tolerance.  However if there are significant laboratory abnormalities we may need to treat this simultaneously with his depression.  Await the results of the lab work

## 2018-02-05 LAB — COMPLETE METABOLIC PANEL WITH GFR
AG Ratio: 1.8 (calc) (ref 1.0–2.5)
ALKALINE PHOSPHATASE (APISO): 93 U/L (ref 40–115)
ALT: 35 U/L (ref 9–46)
AST: 20 U/L (ref 10–35)
Albumin: 4.7 g/dL (ref 3.6–5.1)
BUN: 15 mg/dL (ref 7–25)
CALCIUM: 10 mg/dL (ref 8.6–10.3)
CO2: 24 mmol/L (ref 20–32)
CREATININE: 0.79 mg/dL (ref 0.70–1.33)
Chloride: 102 mmol/L (ref 98–110)
GFR, Est African American: 119 mL/min/{1.73_m2} (ref >=60)
GFR, Est Non African American: 103 mL/min/{1.73_m2} (ref >=60)
GLOBULIN: 2.6 g/dL (ref 1.9–3.7)
GLUCOSE: 164 mg/dL — AB (ref 65–99)
Potassium: 4.2 mmol/L (ref 3.5–5.3)
SODIUM: 137 mmol/L (ref 135–146)
Total Bilirubin: 0.5 mg/dL (ref 0.2–1.2)
Total Protein: 7.3 g/dL (ref 6.1–8.1)

## 2018-02-05 LAB — CBC WITH DIFFERENTIAL/PLATELET
Absolute Monocytes: 673 cells/uL (ref 200–950)
BASOS ABS: 99 {cells}/uL (ref 0–200)
Basophils Relative: 1.5 %
EOS ABS: 139 {cells}/uL (ref 15–500)
EOS PCT: 2.1 %
HCT: 49.7 % (ref 38.5–50.0)
Hemoglobin: 17.1 g/dL (ref 13.2–17.1)
Lymphs Abs: 2026 cells/uL (ref 850–3900)
MCH: 31.1 pg (ref 27.0–33.0)
MCHC: 34.4 g/dL (ref 32.0–36.0)
MCV: 90.5 fL (ref 80.0–100.0)
MONOS PCT: 10.2 %
MPV: 10.2 fL (ref 7.5–12.5)
Neutro Abs: 3663 cells/uL (ref 1500–7800)
Neutrophils Relative %: 55.5 %
PLATELETS: 279 10*3/uL (ref 140–400)
RBC: 5.49 10*6/uL (ref 4.20–5.80)
RDW: 11.9 % (ref 11.0–15.0)
TOTAL LYMPHOCYTE: 30.7 %
WBC: 6.6 10*3/uL (ref 3.8–10.8)

## 2018-02-05 LAB — HEMOGLOBIN A1C
Hgb A1c MFr Bld: 7.3 % of total Hgb — ABNORMAL HIGH (ref <5.7)
Mean Plasma Glucose: 163 (calc)
eAG (mmol/L): 9 (calc)

## 2018-02-05 LAB — TEST AUTHORIZATION

## 2018-02-05 LAB — T4, FREE: Free T4: 1 ng/dL (ref 0.8–1.8)

## 2018-02-05 LAB — VITAMIN B12: Vitamin B-12: 555 pg/mL (ref 200–1100)

## 2018-02-05 LAB — TESTOSTERONE TOTAL,FREE,BIO, MALES
Albumin: 4.7 g/dL (ref 3.6–5.1)
Sex Hormone Binding: 35 nmol/L (ref 10–50)
TESTOSTERONE FREE: 38.5 pg/mL — AB (ref 46.0–224.0)
TESTOSTERONE: 320 ng/dL (ref 250–827)
Testosterone, Bioavailable: 82.4 ng/dL — ABNORMAL LOW (ref >=110.0)

## 2018-02-05 LAB — TSH: TSH: 13.18 m[IU]/L — AB (ref 0.40–4.50)

## 2018-02-07 MED ORDER — "SYRINGE/NEEDLE (DISP) 23G X 1"" 3 ML MISC": 2 refills | Status: DC

## 2018-02-07 MED ORDER — TESTOSTERONE CYPIONATE 200 MG/ML IM SOLN: INTRAMUSCULAR | 3 refills | Status: DC

## 2018-02-07 MED ORDER — LEVOTHYROXINE SODIUM 50 MCG PO TABS: 50.0000 ug | ORAL_TABLET | Freq: Every day | ORAL | 3 refills | Status: DC

## 2018-02-07 NOTE — Telephone Encounter (Signed)
Can you send his testosterone into pharm please.

## 2018-02-07 NOTE — Addendum Note (Signed)
Addended by: Shary Decamp B on: 02/07/2018 10:37 AM   Modules accepted: Orders

## 2018-02-25 ENCOUNTER — Other Ambulatory Visit: Payer: Self-pay | Admitting: *Deleted

## 2018-02-25 MED ORDER — ALPRAZOLAM 0.5 MG PO TABS: ORAL_TABLET | ORAL | 0 refills | Status: DC

## 2018-02-25 NOTE — Telephone Encounter (Signed)
Received fax requesting refill on Xanax.   Ok to refill??  Last office visit 02/04/2018.  Last refill 01/24/2018.

## 2018-03-13 ENCOUNTER — Other Ambulatory Visit: Payer: Self-pay | Admitting: Family Medicine

## 2018-03-13 ENCOUNTER — Encounter: Payer: Self-pay | Admitting: Family Medicine

## 2018-03-13 DIAGNOSIS — E291 Testicular hypofunction: Secondary | ICD-10-CM

## 2018-03-13 DIAGNOSIS — E039 Hypothyroidism, unspecified: Secondary | ICD-10-CM

## 2018-03-18 ENCOUNTER — Other Ambulatory Visit

## 2018-03-18 DIAGNOSIS — E039 Hypothyroidism, unspecified: Secondary | ICD-10-CM

## 2018-03-18 DIAGNOSIS — E291 Testicular hypofunction: Secondary | ICD-10-CM

## 2018-03-19 ENCOUNTER — Other Ambulatory Visit: Payer: Self-pay | Admitting: Family Medicine

## 2018-03-19 DIAGNOSIS — E039 Hypothyroidism, unspecified: Secondary | ICD-10-CM

## 2018-03-19 LAB — TSH: TSH: 5.89 mIU/L — ABNORMAL HIGH (ref 0.40–4.50)

## 2018-03-19 LAB — TESTOSTERONE: Testosterone: 1017 ng/dL — ABNORMAL HIGH (ref 250–827)

## 2018-03-19 MED ORDER — LEVOTHYROXINE SODIUM 75 MCG PO TABS: 75.0000 ug | ORAL_TABLET | Freq: Every day | ORAL | 1 refills | Status: DC

## 2018-03-21 ENCOUNTER — Ambulatory Visit: Admitting: Family Medicine

## 2018-03-26 ENCOUNTER — Other Ambulatory Visit: Payer: Self-pay | Admitting: Family Medicine

## 2018-03-27 NOTE — Telephone Encounter (Signed)
Pt is requesting refill on Xanax   LOV: 02/04/18  LRF:  02/25/18

## 2018-04-09 ENCOUNTER — Encounter: Payer: Self-pay | Admitting: Family Medicine

## 2018-04-19 ENCOUNTER — Other Ambulatory Visit: Payer: Self-pay | Admitting: Family Medicine

## 2018-04-19 NOTE — Telephone Encounter (Signed)
Last filled: 03/27/2018 Last office visit: 02/04/2018

## 2018-04-22 ENCOUNTER — Other Ambulatory Visit: Payer: Self-pay | Admitting: Family Medicine

## 2018-04-22 DIAGNOSIS — E1165 Type 2 diabetes mellitus with hyperglycemia: Principal | ICD-10-CM

## 2018-04-22 DIAGNOSIS — IMO0001 Reserved for inherently not codable concepts without codable children: Secondary | ICD-10-CM

## 2018-04-25 ENCOUNTER — Encounter: Payer: Self-pay | Admitting: Family Medicine

## 2018-04-26 MED ORDER — ALPRAZOLAM 0.5 MG PO TABS: ORAL_TABLET | ORAL | 2 refills | Status: DC

## 2018-04-26 NOTE — Telephone Encounter (Signed)
Pharmacy did not get the rx for Xanax - please resend

## 2018-05-02 ENCOUNTER — Encounter: Payer: Self-pay | Admitting: Family Medicine

## 2018-05-02 MED ORDER — VORTIOXETINE HBR 10 MG PO TABS: 10.0000 mg | ORAL_TABLET | Freq: Every day | ORAL | 3 refills | Status: DC

## 2018-05-16 ENCOUNTER — Encounter: Payer: Self-pay | Admitting: Family Medicine

## 2018-05-20 ENCOUNTER — Ambulatory Visit (INDEPENDENT_AMBULATORY_CARE_PROVIDER_SITE_OTHER): Admitting: Family Medicine

## 2018-05-20 ENCOUNTER — Encounter: Payer: Self-pay | Admitting: Family Medicine

## 2018-05-20 ENCOUNTER — Other Ambulatory Visit: Payer: Self-pay

## 2018-05-20 VITALS — BP 136/80 | HR 62 | Temp 98.1°F | Resp 16 | Ht 71.0 in | Wt 209.0 lb

## 2018-05-20 DIAGNOSIS — G2581 Restless legs syndrome: Secondary | ICD-10-CM

## 2018-05-20 DIAGNOSIS — M25512 Pain in left shoulder: Secondary | ICD-10-CM

## 2018-05-20 DIAGNOSIS — G8929 Other chronic pain: Secondary | ICD-10-CM

## 2018-05-20 DIAGNOSIS — E039 Hypothyroidism, unspecified: Secondary | ICD-10-CM

## 2018-05-20 DIAGNOSIS — E119 Type 2 diabetes mellitus without complications: Secondary | ICD-10-CM | POA: Diagnosis not present

## 2018-05-20 DIAGNOSIS — E291 Testicular hypofunction: Secondary | ICD-10-CM | POA: Diagnosis not present

## 2018-05-20 DIAGNOSIS — R5382 Chronic fatigue, unspecified: Secondary | ICD-10-CM | POA: Diagnosis not present

## 2018-05-20 MED ORDER — ROPINIROLE HCL 0.5 MG PO TABS: 0.5000 mg | ORAL_TABLET | Freq: Every day | ORAL | 1 refills | Status: DC

## 2018-05-20 NOTE — Progress Notes (Signed)
Subjective:    Patient ID: Mike Henderson, male    DOB: [birth year], 53 y.o.   MRN: [id number]  Medication Refill     10/14/15 Patient is here today for a checkup. He is currently on losartan 25 mg a day for hypertension. He has lost 16 pounds since last office visit. His blood pressure is much better. May not require medication any further. Denies chest pain shortness of breath or dyspnea on exertion. He is also on simvastatin 20 mg a day for hyperlipidemia. He denies myalgias or right upper quadrant pain. He is overdue for prostate cancer screening and request a PSA. He is also overdue for colonoscopy. He is willing to allow me to schedule colonoscopy. He declines a flu shot today. He is requesting a refill on his Effexor which he uses for depression and anxiety. Fortunately the patient states that he is doing extremely well. He feels much better than he has over the last few years as his stress and anxiety have improved. He is trying exercise any better. I believe this is reflected in his weight loss.  At that time, my plan was: Blood pressures well controlled. Patient will temporarily discontinue losartan and we will monitor his blood pressure. As long as his blood pressure is less than 140/90, he may no longer require the medication due to slice all changes in weight loss. I will also check his cholesterol. His goal LDL cholesterol is less than 130. While checking lab work I will screen the patient for prostate cancer with a PSA. I will also schedule the patient for colonoscopy. He is due for colon cancer screening. He declines hepatitis C screening even though he has several tattoos. He declines a flu shot today  10/25/15 At that time, my plan was:  We spent 20 minutes discussing his diet, lifestyle changes, and options for treatment. He has been eating a very high carbohydrate diet. I recommended less than 45 g of carbs per meal.  I recommended 30 minutes a day of aerobic exercise.  Discontinue all beer, sodas, and sweet tea. Reduce consumption of bread and potatoes and pasta. Increase Concepcin a fiber and green leafy vegetables. Add metformin 1000 mg by mouth twice a day. Recheck in 3 months  01/27/16 Patient's weight is stable. He has made drastic changes in his diet decreasing his consumption of carbohydrates. His hemoglobin A1c has fallen almost 2 points on metformin. Unfortunately is still above goal at 7.8. He also has elevations of his triglycerides and LDL cholesterol. He is here today to discuss further options.  At that time, my plan was: Although I'm very happy with the reduction in his blood sugars, the patient is still not quite at goal. Therefore together we decided to start him on Jardiance 25 mg a day continue metformin 1000 mg twice a day. His blood pressure is at goal. LDL cholesterol is still slightly high at 107. His goal is less than 100. He continues to want to try to work on diet exercise and weight loss. We will recheck this again in 3 months  05/02/16 Has worked very hard on his diet and exercise. Has lost 12 pounds. Hemoglobin A1c has dropped from 7.8-5.8. Is tolerating Jardiance well.  Denies bladder infections or yeast infections or jock itch. Denies dry mouth. Blood pressure today is excellent. Denies any chest pain shortness of breath or dyspnea on exertion. His try and exercise 5 days a week.  At that time, my plan was: I am very  proud of this patient. Since September of last year, he has dropped his A1c from 9.8-7.8-5.8. He has made drastic dietary changes, weight loss, and is exercising regularly.  I gave him the option of [place] jardiance.  However he would like to continue the medication for now and recheck all his lab work in June. In June he would also be due for fasting lipid panel, urine microalbumin, a testosterone level, and a PSA  Patient presents today wanting to come off his Effexor.  However when I question why, the patient reports feeling  melancholy.  He reports feeling tired.  He reports feeling like he has no energy or desire to do anything.  He has a history of hypogonadism however he is not taking his testosterone.  He is taking it randomly but not consistently.  He denies any chest pain shortness of breath or dyspnea on exertion.  He does complain of erectile dysfunction and poor libido.  At that time, my plan was: Vast majority of our visit today was spent in discussion teasing out exactly what his symptoms are.  Patient has fatigue.  He has melancholy.  He has lack of energy, he has lack of desire, he has poor libido, and he has erectile dysfunction.  I believe this is a combination of poorly controlled depression as well as hypogonadism.  I recommended starting by checking a baseline laboratory panel to determine exactly his metabolic parameters.  This will include a CBC, CMP, TSH, A1c, testosterone level, and vitamin B12.  If labs are within normal limits, I would focus our attention on better treating his depression.  I would switch his venlafaxine to Trintellix.  Previously he had tried Zoloft but that medication lost its effectiveness and that is why he was switched to venlafaxine in the first place.  He has been on this medication for many years and he may be developing a tolerance.  However if there are significant laboratory abnormalities we may need to treat this simultaneously with his depression.  Await the results of the lab work  05/20/18 In Jan, labs were significant for TSH 13, Free testosterone of 38, and HgA1c of 7.3.  We started levothyroxine and testosterone.  Most recent testosterone was >1000 and TSH was ~5 on Feb 167th.  I upped levothyroxine to 75 mcg a day and continued his testosterone therapy at 200 mg every two weeks.  We also started trintellix 10 mg a day and discontinued effexor.  Patient feels that the depression is better since he increase the Trintellix from 5 to 10 mg however he continues to deal with  anxiety particularly at night.  However he is not certain if it is the anxiety keeping him from sleeping or whether it is his restless leg.  He has unusual uncomfortable sensations particularly in his left leg whenever he is lying still in bed at night.  If he moves his leg, the symptoms will go away however they will return quickly once he is still again.  It is burning creeping crawling pain primarily only in his left leg and it is made better when he moves.  He does not even notice the pain during the day when he is active.  He also complains of pain in his left shoulder.  The pain occurs with abduction and internal rotation.  He has been receiving cortisone injection into the subacromial space every 6 months under the care of his orthopedist however he has been unable to reach them due to the recent  quarantine.  Regarding his fatigue, his fatigue is much better since starting the levothyroxine and taking the testosterone.  Overall he is doing much better other than the issues listed above. Past Medical History:  Diagnosis Date  . Anxiety    Panic attacks  . Chronic prostatitis   . Depression   . Diabetes mellitus without complication ([place])   . ED (erectile dysfunction)   . Epididymitis   . GERD (gastroesophageal reflux disease)   . Hyperlipidemia   . Migraines   . OSA (obstructive sleep apnea) 02/01/12   SLEEP STUDY [facility]  . RBBB    Past Surgical History:  Procedure Laterality Date  . COLONOSCOPY  2001   normal per pt. in [facility]  . SHOULDER ARTHROSCOPY  09/2005   left  . SHOULDER ARTHROSCOPY  01/2006   right  . UMBILICAL HERNIA REPAIR  01/2009  . UVULOPALATOPHARYNGOPLASTY  08/2001   Current Outpatient Medications on File Prior to Visit  Medication Sig Dispense Refill  . ALPRAZolam (XANAX) 0.5 MG tablet TAKE 1 TABLET(0.5 MG) BY MOUTH AT BEDTIME AS NEEDED FOR ANXIETY 30 tablet 2  . atorvastatin (LIPITOR) 40 MG tablet TAKE 1 TABLET DAILY 90 tablet 4  .  JARDIANCE 25 MG TABS tablet TAKE 1 TABLET DAILY 90 tablet 3  . lansoprazole (PREVACID) 30 MG capsule TAKE 1 CAPSULE DAILY BEFORE A MEAL 90 capsule 4  . levothyroxine (SYNTHROID, LEVOTHROID) 75 MCG tablet Take 1 tablet (75 mcg total) by mouth daily. 90 tablet 1  . metFORMIN (GLUCOPHAGE) 500 MG tablet TAKE 2 TABLETS (1000 MG TOTAL) TWICE A DAY WITH A MEAL 360 tablet 3  . Multiple Vitamin (MULTIVITAMIN) tablet Take 1 tablet by mouth daily.    . SYRINGE-NEEDLE, DISP, 3 ML (LUER LOCK SAFETY SYRINGES) 23G X 1" 3 ML MISC [place] with Testosterone injection q 2 weeks 50 each 2  . testosterone cypionate (DEPOTESTOSTERONE CYPIONATE) 200 MG/ML injection INJECT 1ML INTO THE MUSCLE EVERY 14 DAYS 10 mL 3  . UNABLE TO FIND Med Name:CPAP THERAPHY    . vardenafil (LEVITRA) 20 MG tablet Take 20 mg by mouth daily as needed.      . vortioxetine HBr (TRINTELLIX) 10 MG TABS tablet Take 1 tablet (10 mg total) by mouth daily. 90 tablet 3   No current facility-administered medications on file prior to visit.    No Known Allergies Social History   Socioeconomic History  . Marital status: Married    Spouse name: Not on file  . Number of children: Not on file  . Years of education: Not on file  . Highest education level: Not on file  Occupational History  . Not on file  Social Needs  . Financial resource strain: Not on file  . Food insecurity:    Worry: Not on file    Inability: Not on file  . Transportation needs:    Medical: Not on file    Non-medical: Not on file  Tobacco Use  . Smoking status: Never Smoker  . Smokeless tobacco: Never Used  Substance and Sexual Activity  . Alcohol use: Yes    Alcohol/week: 2.0 standard drinks    Types: 2 Standard drinks or equivalent per week    Comment: socail 1 drink per week per pt  . Drug use: No  . Sexual activity: Not on file  Lifestyle  . Physical activity:    Days per week: Not on file    Minutes per session: Not on file  .  Stress: Not on file  Relationships   . Social connections:    Talks on phone: Not on file    Gets together: Not on file    Attends religious service: Not on file    Active member of club or organization: Not on file    Attends meetings of clubs or organizations: Not on file    Relationship status: Not on file  . Intimate partner violence:    Fear of current or ex partner: Not on file    Emotionally abused: Not on file    Physically abused: Not on file    Forced sexual activity: Not on file  Other Topics Concern  . Not on file  Social History Narrative  . Not on file      Review of Systems  All other systems reviewed and are negative.      Objective:   Physical Exam  Constitutional: He appears well-developed and well-nourished. No distress.  HENT:  Mouth/Throat: No oropharyngeal exudate.  Eyes: Pupils are equal, round, and reactive to light. Conjunctivae and EOM are normal. No scleral icterus.  Neck: Neck supple. No JVD present.  Cardiovascular: Normal rate, regular rhythm and normal heart sounds.  No murmur heard. Pulmonary/Chest: Effort normal and breath sounds normal. No respiratory distress. He has no wheezes. He has no rales.  Musculoskeletal:     Left shoulder: He exhibits decreased range of motion, tenderness and pain. He exhibits normal strength.  Skin: He is not diaphoretic.  Vitals reviewed.         Assessment & Plan:  Hypothyroidism, unspecified type - Plan: TSH  Hypogonadism in male - Plan: CBC with Differential/Platelet, PSA, Testosterone Total,Free,Bio, Males  Chronic fatigue  Controlled type 2 diabetes mellitus without complication, without long-term current use of insulin (HCC) - Plan: CBC with Differential/Platelet, COMPLETE METABOLIC PANEL WITH GFR, Hemoglobin A1c  Chronic left shoulder pain  RLS (restless legs syndrome)  Fatigue has improved dramatically since starting testosterone and using levothyroxine.  I will recheck his PSA and CBC to ensure that he is not experiencing  side effects from the testosterone.  I will also recheck a TSH to ensure proper dosing of the levothyroxine.  While checking lab work, I will check a hemoglobin A1c to hopefully show that his diabetes is better controlled now that he is more active and exercising.  Blood pressures acceptable today at 136/80.  Regarding his chronic left shoulder pain, using sterile technique, I injected the left subacromial space with 2 cc of lidocaine, 2 cc of Marcaine, and 2 cc of 40 mg/mL Kenalog.  The patient tolerated the procedure well without complication.  Regarding his restless leg syndrome, I will try the patient on Requip 0.5 mg p.o. nightly and reassess the patient via email in 2 weeks.  Consider increasing Trintellix to 20 mg if he continues to deal with anxiety primarily at night.

## 2018-05-21 LAB — TESTOSTERONE TOTAL,FREE,BIO, MALES
Albumin: 4.6 g/dL (ref 3.6–5.1)
Sex Hormone Binding: 38 nmol/L (ref 10–50)
Testosterone, Bioavailable: 131.9 ng/dL (ref >=110.0)
Testosterone, Free: 62.8 pg/mL (ref 46.0–224.0)
Testosterone: 524 ng/dL (ref 250–827)

## 2018-05-21 LAB — COMPLETE METABOLIC PANEL WITH GFR
AG Ratio: 1.8 (calc) (ref 1.0–2.5)
ALT: 18 U/L (ref 9–46)
AST: 16 U/L (ref 10–35)
Albumin: 4.6 g/dL (ref 3.6–5.1)
Alkaline phosphatase (APISO): 78 U/L (ref 35–144)
BUN/Creatinine Ratio: 24 (calc) — ABNORMAL HIGH (ref 6–22)
BUN: 16 mg/dL (ref 7–25)
CO2: 23 mmol/L (ref 20–32)
Calcium: 9.5 mg/dL (ref 8.6–10.3)
Chloride: 103 mmol/L (ref 98–110)
Creat: 0.68 mg/dL — ABNORMAL LOW (ref 0.70–1.33)
GFR, Est African American: 126 mL/min/{1.73_m2} (ref >=60)
GFR, Est Non African American: 109 mL/min/{1.73_m2} (ref >=60)
Globulin: 2.5 g/dL (calc) (ref 1.9–3.7)
Glucose, Bld: 137 mg/dL — ABNORMAL HIGH (ref 65–99)
Potassium: 4.4 mmol/L (ref 3.5–5.3)
Sodium: 138 mmol/L (ref 135–146)
Total Bilirubin: 0.5 mg/dL (ref 0.2–1.2)
Total Protein: 7.1 g/dL (ref 6.1–8.1)

## 2018-05-21 LAB — CBC WITH DIFFERENTIAL/PLATELET
Absolute Monocytes: 688 cells/uL (ref 200–950)
Basophils Absolute: 72 cells/uL (ref 0–200)
Basophils Relative: 1.3 %
Eosinophils Absolute: 149 cells/uL (ref 15–500)
Eosinophils Relative: 2.7 %
HCT: 47.3 % (ref 38.5–50.0)
Hemoglobin: 16.4 g/dL (ref 13.2–17.1)
Lymphs Abs: 2008 cells/uL (ref 850–3900)
MCH: 31.1 pg (ref 27.0–33.0)
MCHC: 34.7 g/dL (ref 32.0–36.0)
MCV: 89.6 fL (ref 80.0–100.0)
MPV: 10.1 fL (ref 7.5–12.5)
Monocytes Relative: 12.5 %
Neutro Abs: 2585 cells/uL (ref 1500–7800)
Neutrophils Relative %: 47 %
Platelets: 285 10*3/uL (ref 140–400)
RBC: 5.28 10*6/uL (ref 4.20–5.80)
RDW: 11.7 % (ref 11.0–15.0)
Total Lymphocyte: 36.5 %
WBC: 5.5 10*3/uL (ref 3.8–10.8)

## 2018-05-21 LAB — PSA: PSA: 0.5 ng/mL (ref <=4.0)

## 2018-05-21 LAB — TSH: TSH: 3.64 mIU/L (ref 0.40–4.50)

## 2018-05-21 LAB — HEMOGLOBIN A1C
Hgb A1c MFr Bld: 6.9 % of total Hgb — ABNORMAL HIGH (ref <5.7)
Mean Plasma Glucose: 151 (calc)
eAG (mmol/L): 8.4 (calc)

## 2018-05-27 ENCOUNTER — Other Ambulatory Visit: Payer: Self-pay | Admitting: Family Medicine

## 2018-05-27 DIAGNOSIS — E1100 Type 2 diabetes mellitus with hyperosmolarity without nonketotic hyperglycemic-hyperosmolar coma (NKHHC): Secondary | ICD-10-CM

## 2018-06-06 ENCOUNTER — Encounter: Payer: Self-pay | Admitting: Family Medicine

## 2018-06-06 ENCOUNTER — Other Ambulatory Visit: Payer: Self-pay | Admitting: Family Medicine

## 2018-06-06 MED ORDER — CLONAZEPAM 0.5 MG PO TABS: 0.5000 mg | ORAL_TABLET | Freq: Two times a day (BID) | ORAL | 0 refills | Status: DC | PRN

## 2018-06-30 ENCOUNTER — Other Ambulatory Visit: Payer: Self-pay | Admitting: Family Medicine

## 2018-07-02 NOTE — Telephone Encounter (Signed)
Requested Prescriptions   Pending Prescriptions Disp Refills  . clonazePAM (KLONOPIN) 0.5 MG tablet [Pharmacy Med Name: CLONAZEPAM 0.5MG  TABLETS] 60 tablet     Sig: TAKE 1 TABLET(0.5 MG) BY MOUTH TWICE DAILY AS NEEDED FOR ANXIETY   Last OV 05/20/2018 Last written 06/06/2018

## 2018-07-03 ENCOUNTER — Encounter: Payer: Self-pay | Admitting: Family Medicine

## 2018-07-03 MED ORDER — VORTIOXETINE HBR 20 MG PO TABS: 20.0000 mg | ORAL_TABLET | Freq: Every day | ORAL | 5 refills | Status: DC

## 2018-08-06 ENCOUNTER — Encounter: Payer: Self-pay | Admitting: Family Medicine

## 2018-08-12 ENCOUNTER — Encounter: Payer: Self-pay | Admitting: Family Medicine

## 2018-08-12 MED ORDER — VORTIOXETINE HBR 20 MG PO TABS: 20.0000 mg | ORAL_TABLET | Freq: Every day | ORAL | 1 refills | Status: DC

## 2018-08-30 ENCOUNTER — Encounter: Payer: Self-pay | Admitting: Family Medicine

## 2018-09-03 ENCOUNTER — Other Ambulatory Visit: Payer: Self-pay | Admitting: Family Medicine

## 2018-09-20 ENCOUNTER — Encounter: Payer: Self-pay | Admitting: Family Medicine

## 2018-09-20 ENCOUNTER — Ambulatory Visit (INDEPENDENT_AMBULATORY_CARE_PROVIDER_SITE_OTHER): Admitting: Family Medicine

## 2018-09-20 ENCOUNTER — Other Ambulatory Visit: Payer: Self-pay

## 2018-09-20 VITALS — BP 120/72 | HR 68 | Temp 98.0°F | Resp 14 | Ht 71.0 in | Wt 200.0 lb

## 2018-09-20 DIAGNOSIS — G8929 Other chronic pain: Secondary | ICD-10-CM

## 2018-09-20 DIAGNOSIS — N410 Acute prostatitis: Secondary | ICD-10-CM | POA: Diagnosis not present

## 2018-09-20 DIAGNOSIS — M25512 Pain in left shoulder: Secondary | ICD-10-CM

## 2018-09-20 MED ORDER — CIPROFLOXACIN HCL 500 MG PO TABS: 500.0000 mg | ORAL_TABLET | Freq: Two times a day (BID) | ORAL | 0 refills | Status: DC

## 2018-09-20 NOTE — Progress Notes (Signed)
Subjective:    Patient ID: Mike Henderson, male    DOB: 05-Oct-1965, 53 y.o.   MRN: [id number]  HPI Patient has a history of prostatitis.  His previous episodes of prostatitis have been characterized by pain within the rectum as well as constipation.  Patient also reports feeling pain and discomfort in his scrotum.  He describes the pain as a pulled muscle sensation.  He also complains of pain with defecation when this occurs.  He developed these exact same symptoms a few days ago.  Symptoms are gradually worsening.  He would request antibiotics for prostate infection.  He denies any dysuria.  He denies any urgency or hematuria or fevers or chills.  However he has had 3 separate episodes of prostatitis that is all present at the exact same way and have responded accordingly to antibiotics.  The last episode has been several years ago.  He also complains of pain in his left shoulder.  He has pain with abduction as well as internal and external rotation.  He is requesting a cortisone injection in his left shoulder.  The last cortisone injection I gave the patient was approximately 4 months ago and gave him significant relief for 4 months.  Past Medical History:  Diagnosis Date  . Anxiety    Panic attacks  . Chronic prostatitis   . Depression   . Diabetes mellitus without complication ([place])   . ED (erectile dysfunction)   . Epididymitis   . GERD (gastroesophageal reflux disease)   . Hyperlipidemia   . Migraines   . OSA (obstructive sleep apnea) 02/01/12   SLEEP STUDY [facility]  . RBBB    Past Surgical History:  Procedure Laterality Date  . COLONOSCOPY  2001   normal per pt. in [facility] [employer] [facility] [employer]  . SHOULDER ARTHROSCOPY  09/2005   left  . SHOULDER ARTHROSCOPY  01/2006   right  . UMBILICAL HERNIA REPAIR  01/2009  . UVULOPALATOPHARYNGOPLASTY  08/2001   Current Outpatient Medications on File Prior to Visit  Medication Sig Dispense Refill  . atorvastatin (LIPITOR) 40 MG  tablet TAKE 1 TABLET DAILY 90 tablet 4  . clonazePAM (KLONOPIN) 0.5 MG tablet TAKE 1 TABLET(0.5 MG) BY MOUTH TWICE DAILY AS NEEDED FOR ANXIETY 60 tablet 2  . JARDIANCE 25 MG TABS tablet TAKE 1 TABLET DAILY 90 tablet 3  . lansoprazole (PREVACID) 30 MG capsule TAKE 1 CAPSULE DAILY BEFORE A MEAL 90 capsule 3  . levothyroxine (SYNTHROID, LEVOTHROID) 75 MCG tablet Take 1 tablet (75 mcg total) by mouth daily. 90 tablet 1  . metFORMIN (GLUCOPHAGE) 500 MG tablet TAKE 2 TABLETS (1000 MG TOTAL) TWICE A DAY WITH A MEAL 360 tablet 3  . Multiple Vitamin (MULTIVITAMIN) tablet Take 1 tablet by mouth daily.    Marland Kitchen rOPINIRole (REQUIP) 0.5 MG tablet Take 1 tablet (0.5 mg total) by mouth at bedtime. 30 tablet 1  . SYRINGE-NEEDLE, DISP, 3 ML (LUER LOCK SAFETY SYRINGES) 23G X 1" 3 ML MISC [place] with Testosterone injection q 2 weeks 50 each 2  . testosterone cypionate (DEPOTESTOSTERONE CYPIONATE) 200 MG/ML injection INJECT 1ML INTO THE MUSCLE EVERY 14 DAYS 10 mL 3  . UNABLE TO FIND Med Name:CPAP THERAPHY    . vardenafil (LEVITRA) 20 MG tablet Take 20 mg by mouth daily as needed.      . vortioxetine HBr (TRINTELLIX) 20 MG TABS tablet Take 1 tablet (20 mg total) by mouth daily. 90 tablet 1   No current facility-administered medications on  file prior to visit.    No Known Allergies Social History   Socioeconomic History  . Marital status: Married    Spouse name: Not on file  . Number of children: Not on file  . Years of education: Not on file  . Highest education level: Not on file  Occupational History  . Not on file  Social Needs  . Financial resource strain: Not on file  . Food insecurity    Worry: Not on file    Inability: Not on file  . Transportation needs    Medical: Not on file    Non-medical: Not on file  Tobacco Use  . Smoking status: Never Smoker  . Smokeless tobacco: Never Used  Substance and Sexual Activity  . Alcohol use: Yes    Alcohol/week: 2.0 standard drinks    Types: 2 Standard drinks  or equivalent per week    Comment: socail 1 drink per week per pt  . Drug use: No  . Sexual activity: Not on file  Lifestyle  . Physical activity    Days per week: Not on file    Minutes per session: Not on file  . Stress: Not on file  Relationships  . Social Herbalist on phone: Not on file    Gets together: Not on file    Attends religious service: Not on file    Active member of club or organization: Not on file    Attends meetings of clubs or organizations: Not on file    Relationship status: Not on file  . Intimate partner violence    Fear of current or ex partner: Not on file    Emotionally abused: Not on file    Physically abused: Not on file    Forced sexual activity: Not on file  Other Topics Concern  . Not on file  Social History Narrative  . Not on file    Review of Systems  All other systems reviewed and are negative.      Objective:   Physical Exam Vitals signs reviewed.  Constitutional:      Appearance: Normal appearance. He is obese.  Cardiovascular:     Rate and Rhythm: Normal rate and regular rhythm.     Heart sounds: Normal heart sounds.  Pulmonary:     Effort: Pulmonary effort is normal.     Breath sounds: Normal breath sounds.  Abdominal:     General: Bowel sounds are normal. There is no distension.     Palpations: Abdomen is soft.     Tenderness: There is no abdominal tenderness.  Musculoskeletal:     Left shoulder: He exhibits decreased range of motion and pain. He exhibits no tenderness, no swelling, no effusion, no crepitus, no spasm and normal strength.  Neurological:     Mental Status: He is alert.    Patient politely declines digital rectal exam       Assessment & Plan:  The primary encounter diagnosis was Acute prostatitis. A diagnosis of Chronic left shoulder pain was also pertinent to this visit. I will treat the patient for prostatitis based on his history.  He will take Cipro 500 mg p.o. twice daily for 10 days.   If symptoms do not improve however I would strongly recommend a digital rectal exam to evaluate further.  Patient agrees.  Using sterile technique, I injected the left subacromial space with 2 cc of lidocaine, 2 cc of Marcaine, and 2 cc of 40 mg/mL Kenalog.  The  patient tolerated the procedure well without complication.

## 2018-10-01 ENCOUNTER — Other Ambulatory Visit: Payer: Self-pay | Admitting: Family Medicine

## 2018-10-02 ENCOUNTER — Encounter: Payer: Self-pay | Admitting: Family Medicine

## 2018-10-10 ENCOUNTER — Ambulatory Visit: Admitting: Family Medicine

## 2018-11-21 ENCOUNTER — Ambulatory Visit (INDEPENDENT_AMBULATORY_CARE_PROVIDER_SITE_OTHER): Admitting: Family Medicine

## 2018-11-21 ENCOUNTER — Other Ambulatory Visit: Payer: Self-pay | Admitting: Family Medicine

## 2018-11-21 ENCOUNTER — Other Ambulatory Visit: Payer: Self-pay

## 2018-11-21 ENCOUNTER — Encounter: Payer: Self-pay | Admitting: Family Medicine

## 2018-11-21 VITALS — BP 128/70 | HR 60 | Temp 97.6°F | Resp 14 | Ht 71.0 in | Wt 198.0 lb

## 2018-11-21 DIAGNOSIS — E119 Type 2 diabetes mellitus without complications: Secondary | ICD-10-CM | POA: Diagnosis not present

## 2018-11-21 DIAGNOSIS — N410 Acute prostatitis: Secondary | ICD-10-CM

## 2018-11-21 MED ORDER — CIPROFLOXACIN HCL 500 MG PO TABS: 500.0000 mg | ORAL_TABLET | Freq: Two times a day (BID) | ORAL | 0 refills | Status: DC

## 2018-11-21 NOTE — Telephone Encounter (Signed)
Ok to refill??  Last office visit 11/21/2018.  Last refill 07/02/2018, #2 refills

## 2018-11-21 NOTE — Progress Notes (Signed)
Subjective:    Patient ID: Mike Henderson, male    DOB: 1966-01-14, 53 y.o.   MRN: [id number]  HPI Patient again believes he has prostatitis.  This is probably the second or third time he has had this issue in the last year.  He reports pain deep within his rectum.  He describes it as a deep aching pain.  Previous infections with his prostate have given him the exact same symptoms and the symptoms have improved as soon as he takes antibiotics.  However he denies dysuria or urgency or frequency which is unusual.  I have discussed this in his previous office visits.  I did perform a physical exam today and the patient allow me to do a digital rectal exam.  The prostate is boggy and swollen and tender to palpation but without nodularity.  There is no fecal impaction.  He does report constipation which he believes helped trigger this most recent case.  Of note he has been on Jardiance a little more than a year Past Medical History:  Diagnosis Date  . Anxiety    Panic attacks  . Chronic prostatitis   . Depression   . Diabetes mellitus without complication ([place])   . ED (erectile dysfunction)   . Epididymitis   . GERD (gastroesophageal reflux disease)   . Hyperlipidemia   . Migraines   . OSA (obstructive sleep apnea) 02/01/12   SLEEP STUDY [facility]  . RBBB    Past Surgical History:  Procedure Laterality Date  . COLONOSCOPY  2001   normal per pt. in [facility]  . SHOULDER ARTHROSCOPY  09/2005   left  . SHOULDER ARTHROSCOPY  01/2006   right  . UMBILICAL HERNIA REPAIR  01/2009  . UVULOPALATOPHARYNGOPLASTY  08/2001   Current Outpatient Medications on File Prior to Visit  Medication Sig Dispense Refill  . atorvastatin (LIPITOR) 40 MG tablet TAKE 1 TABLET DAILY 90 tablet 4  . clonazePAM (KLONOPIN) 0.5 MG tablet TAKE 1 TABLET(0.5 MG) BY MOUTH TWICE DAILY AS NEEDED FOR ANXIETY 60 tablet 2  . JARDIANCE 25 MG TABS tablet TAKE 1 TABLET DAILY 90 tablet 3  . lansoprazole  (PREVACID) 30 MG capsule TAKE 1 CAPSULE DAILY BEFORE A MEAL 90 capsule 3  . levothyroxine (SYNTHROID) 75 MCG tablet TAKE 1 TABLET(75 MCG) BY MOUTH DAILY 90 tablet 1  . metFORMIN (GLUCOPHAGE) 500 MG tablet TAKE 2 TABLETS (1000 MG TOTAL) TWICE A DAY WITH A MEAL 360 tablet 3  . Multiple Vitamin (MULTIVITAMIN) tablet Take 1 tablet by mouth daily.    Marland Kitchen rOPINIRole (REQUIP) 0.5 MG tablet Take 1 tablet (0.5 mg total) by mouth at bedtime. 30 tablet 1  . SYRINGE-NEEDLE, DISP, 3 ML (LUER LOCK SAFETY SYRINGES) 23G X 1" 3 ML MISC [place] with Testosterone injection q 2 weeks 50 each 2  . testosterone cypionate (DEPOTESTOSTERONE CYPIONATE) 200 MG/ML injection INJECT 1ML INTO THE MUSCLE EVERY 14 DAYS 10 mL 3  . UNABLE TO FIND Med Name:CPAP THERAPHY    . vardenafil (LEVITRA) 20 MG tablet Take 20 mg by mouth daily as needed.      . vortioxetine HBr (TRINTELLIX) 20 MG TABS tablet Take 1 tablet (20 mg total) by mouth daily. 90 tablet 1   No current facility-administered medications on file prior to visit.    No Known Allergies Social History   Socioeconomic History  . Marital status: Married    Spouse name: Not on file  . Number of children: Not on  file  . Years of education: Not on file  . Highest education level: Not on file  Occupational History  . Not on file  Social Needs  . Financial resource strain: Not on file  . Food insecurity    Worry: Not on file    Inability: Not on file  . Transportation needs    Medical: Not on file    Non-medical: Not on file  Tobacco Use  . Smoking status: Never Smoker  . Smokeless tobacco: Never Used  Substance and Sexual Activity  . Alcohol use: Yes    Alcohol/week: 2.0 standard drinks    Types: 2 Standard drinks or equivalent per week    Comment: socail 1 drink per week per pt  . Drug use: No  . Sexual activity: Not on file  Lifestyle  . Physical activity    Days per week: Not on file    Minutes per session: Not on file  . Stress: Not on file  Relationships   . Social Herbalist on phone: Not on file    Gets together: Not on file    Attends religious service: Not on file    Active member of club or organization: Not on file    Attends meetings of clubs or organizations: Not on file    Relationship status: Not on file  . Intimate partner violence    Fear of current or ex partner: Not on file    Emotionally abused: Not on file    Physically abused: Not on file    Forced sexual activity: Not on file  Other Topics Concern  . Not on file  Social History Narrative  . Not on file    Review of Systems  All other systems reviewed and are negative.      Objective:   Physical Exam Vitals signs reviewed.  Constitutional:      Appearance: Normal appearance. He is obese.  Cardiovascular:     Rate and Rhythm: Normal rate and regular rhythm.     Heart sounds: Normal heart sounds.  Pulmonary:     Effort: Pulmonary effort is normal.     Breath sounds: Normal breath sounds.  Abdominal:     General: Bowel sounds are normal. There is no distension.     Palpations: Abdomen is soft.     Tenderness: There is no abdominal tenderness.  Genitourinary:    Prostate: Enlarged and tender. No nodules present.  Neurological:     Mental Status: He is alert.          Assessment & Plan:  The primary encounter diagnosis was Acute prostatitis. A diagnosis of Controlled type 2 diabetes mellitus without complication, without long-term current use of insulin (HCC) was also pertinent to this visit. I will treat the patient for prostatitis based on his history.  He will take Cipro 500 mg p.o. twice daily for 10 days.  However I believe that the recurrent cases could potentially be due to his Jardiance.  Therefore I will discontinue Jardiance.  I will check a hemoglobin A1c today.  If hemoglobin A1c is elevated beyond 6.5 I will replace the Jardiance with Trulicity.  Patient is comfortable with this plan.  I will treat his chronic constipation with  Linzess 292 mcg p.o. daily.

## 2018-11-22 ENCOUNTER — Encounter: Payer: Self-pay | Admitting: Family Medicine

## 2018-11-22 LAB — CBC WITH DIFFERENTIAL/PLATELET
Absolute Monocytes: 598 cells/uL (ref 200–950)
Basophils Absolute: 82 cells/uL (ref 0–200)
Basophils Relative: 1.2 %
Eosinophils Absolute: 102 cells/uL (ref 15–500)
Eosinophils Relative: 1.5 %
HCT: 48.9 % (ref 38.5–50.0)
Hemoglobin: 16.5 g/dL (ref 13.2–17.1)
Lymphs Abs: 2190 cells/uL (ref 850–3900)
MCH: 31.1 pg (ref 27.0–33.0)
MCHC: 33.7 g/dL (ref 32.0–36.0)
MCV: 92.3 fL (ref 80.0–100.0)
MPV: 10 fL (ref 7.5–12.5)
Monocytes Relative: 8.8 %
Neutro Abs: 3828 cells/uL (ref 1500–7800)
Neutrophils Relative %: 56.3 %
Platelets: 248 10*3/uL (ref 140–400)
RBC: 5.3 10*6/uL (ref 4.20–5.80)
RDW: 12.3 % (ref 11.0–15.0)
Total Lymphocyte: 32.2 %
WBC: 6.8 10*3/uL (ref 3.8–10.8)

## 2018-11-22 LAB — COMPLETE METABOLIC PANEL WITH GFR
AG Ratio: 2 (calc) (ref 1.0–2.5)
ALT: 22 U/L (ref 9–46)
AST: 14 U/L (ref 10–35)
Albumin: 4.5 g/dL (ref 3.6–5.1)
Alkaline phosphatase (APISO): 71 U/L (ref 35–144)
BUN: 17 mg/dL (ref 7–25)
CO2: 26 mmol/L (ref 20–32)
Calcium: 10 mg/dL (ref 8.6–10.3)
Chloride: 102 mmol/L (ref 98–110)
Creat: 0.74 mg/dL (ref 0.70–1.33)
GFR, Est African American: 121 mL/min/{1.73_m2} (ref >=60)
GFR, Est Non African American: 105 mL/min/{1.73_m2} (ref >=60)
Globulin: 2.2 g/dL (calc) (ref 1.9–3.7)
Glucose, Bld: 137 mg/dL — ABNORMAL HIGH (ref 65–99)
Potassium: 4 mmol/L (ref 3.5–5.3)
Sodium: 138 mmol/L (ref 135–146)
Total Bilirubin: 0.5 mg/dL (ref 0.2–1.2)
Total Protein: 6.7 g/dL (ref 6.1–8.1)

## 2018-11-22 LAB — HEMOGLOBIN A1C
Hgb A1c MFr Bld: 7.8 % of total Hgb — ABNORMAL HIGH (ref <5.7)
Mean Plasma Glucose: 177 (calc)
eAG (mmol/L): 9.8 (calc)

## 2018-11-22 LAB — LIPID PANEL
Cholesterol: 167 mg/dL (ref <200)
HDL: 52 mg/dL (ref >=40)
LDL Cholesterol (Calc): 93 mg/dL (calc)
Non-HDL Cholesterol (Calc): 115 mg/dL (calc) (ref <130)
Total CHOL/HDL Ratio: 3.2 (calc) (ref <5.0)
Triglycerides: 120 mg/dL (ref <150)

## 2018-11-22 MED ORDER — TRULICITY 1.5 MG/0.5ML ~~LOC~~ SOAJ: 1.5000 mg | SUBCUTANEOUS | 11 refills | Status: DC

## 2018-11-22 MED ORDER — TRULICITY 0.75 MG/0.5ML ~~LOC~~ SOAJ: 0.7500 mg | SUBCUTANEOUS | 0 refills | Status: DC

## 2018-11-22 NOTE — Addendum Note (Signed)
Addended by: Launa Grill on: 11/22/2018 03:38 PM   Modules accepted: Orders

## 2018-12-02 ENCOUNTER — Encounter: Payer: Self-pay | Admitting: Family Medicine

## 2018-12-02 MED ORDER — SULFAMETHOXAZOLE-TRIMETHOPRIM 800-160 MG PO TABS: 1.0000 | ORAL_TABLET | Freq: Two times a day (BID) | ORAL | 0 refills | Status: AC

## 2018-12-13 ENCOUNTER — Ambulatory Visit (INDEPENDENT_AMBULATORY_CARE_PROVIDER_SITE_OTHER): Admitting: Family Medicine

## 2018-12-13 ENCOUNTER — Other Ambulatory Visit: Payer: Self-pay

## 2018-12-13 ENCOUNTER — Encounter: Payer: Self-pay | Admitting: Family Medicine

## 2018-12-13 VITALS — BP 110/76 | HR 70 | Temp 97.6°F | Resp 16 | Ht 71.0 in | Wt 201.0 lb

## 2018-12-13 DIAGNOSIS — K6289 Other specified diseases of anus and rectum: Secondary | ICD-10-CM | POA: Diagnosis not present

## 2018-12-13 MED ORDER — DILTIAZEM GEL 2 %: 1.0000 "application " | Freq: Three times a day (TID) | CUTANEOUS | 3 refills | Status: DC

## 2018-12-13 NOTE — Progress Notes (Signed)
Subjective:    Patient ID: Mike Henderson, male    DOB: 31-Oct-1965, 53 y.o.   MRN: [id number]  HPI  11/21/18 Patient again believes he has prostatitis.  This is probably the second or third time he has had this issue in the last year.  He reports pain deep within his rectum.  He describes it as a deep aching pain.  Previous infections with his prostate have given him the exact same symptoms and the symptoms have improved as soon as he takes antibiotics.  However he denies dysuria or urgency or frequency which is unusual.  I have discussed this in his previous office visits.  I did perform a physical exam today and the patient allow me to do a digital rectal exam.  The prostate is boggy and swollen and tender to palpation but without nodularity.  There is no fecal impaction.  He does report constipation which he believes helped trigger this most recent case.  Of note he has been on Jardiance a little more than a year.  At that time, my plan was: I will treat the patient for prostatitis based on his history.  He will take Cipro 500 mg p.o. twice daily for 10 days.  However I believe that the recurrent cases could potentially be due to his Jardiance.  Therefore I will discontinue Jardiance.  I will check a hemoglobin A1c today.  If hemoglobin A1c is elevated beyond 6.5 I will replace the Jardiance with Trulicity.  Patient is comfortable with this plan.  I will treat his chronic constipation with Linzess 292 mcg p.o. daily.  12/13/18 Patient called 12/02/18 and was given an additional 10 days of bactrim.  He continues to report "prostate pain".  However I asked the patient to specifically tell me what his symptoms are.  He reports of pain just inside his rectum on the right-hand side.  It is a constant dull aching pain that intensifies when he has a bowel movement.  He also reports difficulty "opening up" when he has a bowel movement.  It states if it is hard for the stool to go through the rectum.  This  intensifies the pain.  He denies any blood in his stool.  However he does report constipation.  He reports misshapened stool.  He describes it as long and stringy like Play-Doh.  This is as if it is being compressed by something as it is exiting.  Rectal exam was performed today.  Prostate is no longer enlarged.  It is nontender.  There are no palpable rectal masses although there is significant tenderness to palpation just inside the rectum to the left hand side. Past Medical History:  Diagnosis Date   Anxiety    Panic attacks   Chronic prostatitis    Depression    Diabetes mellitus without complication [facility])    ED (erectile dysfunction)    Epididymitis    GERD (gastroesophageal reflux disease)    Hyperlipidemia    Migraines    OSA (obstructive sleep apnea) 02/01/12   SLEEP STUDY [facility]   RBBB    Past Surgical History:  Procedure Laterality Date   COLONOSCOPY  2001   normal per pt. in [place] ARTHROSCOPY  09/2005   left   SHOULDER ARTHROSCOPY  [id number]   right   UMBILICAL HERNIA REPAIR  01/2009   UVULOPALATOPHARYNGOPLASTY  08/2001   Current Outpatient Medications on File Prior to Visit  Medication Sig Dispense Refill  atorvastatin (LIPITOR) 40 MG tablet TAKE 1 TABLET DAILY 90 tablet 4   ciprofloxacin (CIPRO) 500 MG tablet Take 1 tablet (500 mg total) by mouth 2 (two) times daily. 20 tablet 0   clonazePAM (KLONOPIN) 0.5 MG tablet TAKE 1 TABLET(0.5 MG) BY MOUTH TWICE DAILY AS NEEDED FOR ANXIETY 60 tablet 2   Dulaglutide (TRULICITY) A999333 0000000 SOPN Inject 0.75 mg into the skin once a week. X 4 weeks, then D/C. Begin Trulicity 1.5mg  after completed. 4 pen 0   Dulaglutide (TRULICITY) 1.5 0000000 SOPN Inject 1.5 mg into the skin once a week. Begin after completing titration with 0.75mg  x4 weeks. 4 pen 11   lansoprazole (PREVACID) 30 MG capsule TAKE 1 CAPSULE DAILY BEFORE A MEAL 90 capsule 3   levothyroxine (SYNTHROID) 75 MCG  tablet TAKE 1 TABLET(75 MCG) BY MOUTH DAILY 90 tablet 1   metFORMIN (GLUCOPHAGE) 500 MG tablet TAKE 2 TABLETS (1000 MG TOTAL) TWICE A DAY WITH A MEAL 360 tablet 3   Multiple Vitamin (MULTIVITAMIN) tablet Take 1 tablet by mouth daily.     rOPINIRole (REQUIP) 0.5 MG tablet Take 1 tablet (0.5 mg total) by mouth at bedtime. 30 tablet 1   SYRINGE-NEEDLE, DISP, 3 ML (LUER LOCK SAFETY SYRINGES) 23G X 1" 3 ML MISC [place] with Testosterone injection q 2 weeks 50 each 2   testosterone cypionate (DEPOTESTOSTERONE CYPIONATE) 200 MG/ML injection INJECT 1ML INTO THE MUSCLE EVERY 14 DAYS 10 mL 3   UNABLE TO FIND Med Name:CPAP THERAPHY     vardenafil (LEVITRA) 20 MG tablet Take 20 mg by mouth daily as needed.       vortioxetine HBr (TRINTELLIX) 20 MG TABS tablet Take 1 tablet (20 mg total) by mouth daily. 90 tablet 1   No current facility-administered medications on file prior to visit.    No Known Allergies Social History   Socioeconomic History   Marital status: Married    Spouse name: Not on file   Number of children: Not on file   Years of education: Not on file   Highest education level: Not on file  Occupational History   Not on file  Social Needs   Financial resource strain: Not on file   Food insecurity    Worry: Not on file    Inability: Not on file   Transportation needs    Medical: Not on file    Non-medical: Not on file  Tobacco Use   Smoking status: Never Smoker   Smokeless tobacco: Never Used  Substance and Sexual Activity   Alcohol use: Yes    Alcohol/week: 2.0 standard drinks    Types: 2 Standard drinks or equivalent per week    Comment: socail 1 drink per week per pt   Drug use: No   Sexual activity: Not on file  Lifestyle   Physical activity    Days per week: Not on file    Minutes per session: Not on file   Stress: Not on file  Relationships   Social connections    Talks on phone: Not on file    Gets together: Not on file    Attends religious  service: Not on file    Active member of club or organization: Not on file    Attends meetings of clubs or organizations: Not on file    Relationship status: Not on file   Intimate partner violence    Fear of current or ex partner: Not on file    Emotionally abused: Not on file  Physically abused: Not on file    Forced sexual activity: Not on file  Other Topics Concern   Not on file  Social History Narrative   Not on file    Review of Systems  All other systems reviewed and are negative.      Objective:   Physical Exam Vitals signs reviewed.  Constitutional:      Appearance: Normal appearance. He is obese.  Cardiovascular:     Rate and Rhythm: Normal rate and regular rhythm.     Heart sounds: Normal heart sounds.  Pulmonary:     Effort: Pulmonary effort is normal.     Breath sounds: Normal breath sounds.  Abdominal:     General: Bowel sounds are normal. There is no distension.     Palpations: Abdomen is soft.     Tenderness: There is no abdominal tenderness.  Genitourinary:    Prostate: Not enlarged, not tender and no nodules present.     Rectum: Guaiac result negative. Tenderness present. No mass, anal fissure or internal hemorrhoid. Normal anal tone.  Neurological:     Mental Status: He is alert.          Assessment & Plan:  Proctodynia  Exam is unremarkable today.  I do not believe this is a prostate infection.  I believe most likely the patient has an anal fissure although I do not see one on exam.  I believe he is having spasms in his anal sphincter and this is causing constipation and pain with defecation.  Therefore I recommended 2% diltiazem gel applied 3 times daily for the next 6 weeks.  If symptoms or not improving over the next 3 to 4 weeks I would recommend GI consultation for possible sigmoidoscopy to rule out an obstructive mass further inside beyond what I can reach digitally.  Patient is comfortable with this plan.

## 2018-12-28 ENCOUNTER — Encounter: Payer: Self-pay | Admitting: Family Medicine

## 2019-01-08 ENCOUNTER — Other Ambulatory Visit: Payer: Self-pay

## 2019-01-08 ENCOUNTER — Encounter: Payer: Self-pay | Admitting: Family Medicine

## 2019-01-08 MED ORDER — TRULICITY 1.5 MG/0.5ML ~~LOC~~ SOAJ: 1.5000 mg | SUBCUTANEOUS | 11 refills | Status: DC

## 2019-01-11 ENCOUNTER — Encounter: Payer: Self-pay | Admitting: Family Medicine

## 2019-01-13 ENCOUNTER — Other Ambulatory Visit: Payer: Self-pay | Admitting: Family Medicine

## 2019-01-13 ENCOUNTER — Encounter: Payer: Self-pay | Admitting: Family Medicine

## 2019-01-13 DIAGNOSIS — K602 Anal fissure, unspecified: Secondary | ICD-10-CM

## 2019-01-13 MED ORDER — TRULICITY 1.5 MG/0.5ML ~~LOC~~ SOAJ: 1.5000 mg | SUBCUTANEOUS | 4 refills | Status: DC

## 2019-01-13 MED ORDER — LEVOTHYROXINE SODIUM 75 MCG PO TABS: ORAL_TABLET | ORAL | 3 refills | Status: DC

## 2019-01-16 ENCOUNTER — Other Ambulatory Visit: Payer: Self-pay

## 2019-01-16 ENCOUNTER — Ambulatory Visit: Attending: Internal Medicine

## 2019-01-16 DIAGNOSIS — Z20822 Contact with and (suspected) exposure to covid-19: Secondary | ICD-10-CM

## 2019-01-17 LAB — NOVEL CORONAVIRUS, NAA: SARS-CoV-2, NAA: NOT DETECTED

## 2019-02-03 ENCOUNTER — Ambulatory Visit: Admitting: Physician Assistant

## 2019-02-05 ENCOUNTER — Encounter: Payer: Self-pay | Admitting: Physician Assistant

## 2019-02-05 ENCOUNTER — Ambulatory Visit: Attending: Internal Medicine

## 2019-02-05 ENCOUNTER — Ambulatory Visit (INDEPENDENT_AMBULATORY_CARE_PROVIDER_SITE_OTHER): Admitting: Physician Assistant

## 2019-02-05 VITALS — BP 122/80 | HR 68 | Temp 96.4°F | Ht 71.0 in | Wt 201.5 lb

## 2019-02-05 DIAGNOSIS — R194 Change in bowel habit: Secondary | ICD-10-CM

## 2019-02-05 DIAGNOSIS — Z20822 Contact with and (suspected) exposure to covid-19: Secondary | ICD-10-CM

## 2019-02-05 DIAGNOSIS — K6289 Other specified diseases of anus and rectum: Secondary | ICD-10-CM

## 2019-02-05 DIAGNOSIS — K5900 Constipation, unspecified: Secondary | ICD-10-CM | POA: Diagnosis not present

## 2019-02-05 DIAGNOSIS — Z1159 Encounter for screening for other viral diseases: Secondary | ICD-10-CM

## 2019-02-05 MED ORDER — NA SULFATE-K SULFATE-MG SULF 17.5-3.13-1.6 GM/177ML PO SOLN: 1.0000 | Freq: Once | ORAL | 0 refills | Status: AC

## 2019-02-05 NOTE — Patient Instructions (Addendum)
If you are age 55 or older, your body mass index should be between 23-30. Your There is no height or weight on file to calculate BMI. If this is out of the aforementioned range listed, please consider follow up with your Primary Care Provider.  If you are age 55 or younger, your body mass index should be between 19-25. Your There is no height or weight on file to calculate BMI. If this is out of the aformentioned range listed, please consider follow up with your Primary Care Provider.   We have sent the following medications to your pharmacy for you to pick up at your convenience:  You have been scheduled for a colonoscopy. Please follow written instructions given to you at your visit today.  Please pick up your prep supplies at the pharmacy within the next 1-3 days. If you use inhalers (even only as needed), please bring them with you on the day of your procedure.  Please use Miralax daily. You may stop using diltiazem.  Due to recent changes in healthcare laws, you may see the results of your imaging and laboratory studies on MyChart before your provider has had a chance to review them.  We understand that in some cases there may be results that are confusing or concerning to you. Not all laboratory results come back in the same time frame and the provider may be waiting for multiple results in order to interpret others.  Please give [place] 48 hours in order for your provider to thoroughly review all the results before contacting the office for clarification of your results.

## 2019-02-05 NOTE — Progress Notes (Signed)
____________________________________________________________  Attending physician addendum:  Thank you for sending this case to me. I have reviewed the entire note, and the outlined plan seems appropriate.  Aitanna Haubner Danis, MD  ____________________________________________________________  

## 2019-02-05 NOTE — Progress Notes (Signed)
Chief Complaint: Rectal pain  HPI:    Mike Henderson is a 54 year old male with a past medical history as listed below, known to Dr. Loletha Carrow, who was referred to me by Susy Frizzle, MD for a complaint of rectal pain.      01/04/2016 colonoscopy with a 4 mm polyp in the mid ascending colon, otherwise normal.  Pathology revealed tubular adenoma.  Repeat recommended in 5 years.    11/21/2018 patient saw PCP and thought he had prostatitis again.  He was given Cipro 500 twice daily.  It was thought possibly recurrent cases could be due to his Jardiance and this was switched to Trulicity.  He was given Linzess to try.    12/13/2018 patient saw PCP after having been given additional 10 days of Bactrim on 12/02/2018 for prostatitis.  At that time, it was thought possibly his pain was related to a fissure instead, though 1 was not seen on exam.  He was prescribed diltiazem 2% gel 3 times daily.    Today, the patient presents to clinic and explains that he has been somewhat constipated for quite a while feeling like his stool gets backed up.  He tried the Linzess given to him by his PCP but this washed him out so he discontinued after 5 days.  Currently, has been using MiraLAX for the past 5 days which does help him some.  Previously took stool softeners which made things messy and gave him diarrhea per his report.  Describes that he is having truncated stools the size of a penny typically 4-5 times a day, which can be very thin.  Over the last week developed some lower abdominal pain while traveling but tells me he does get somewhat anxious when he is on the go.  Also had a decrease in appetite but wonders if this is related to change to Trulicity.  Has increased fiber in his diet which has not really changed much.      Most concerning to him is that he continues to have a 1-3/10 pain in his rectum which feels like it is up on the inside prior to going which escalates during a bowel movement and continues  afterwards.  He has been using the Diltiazem TID for 10 weeks and does not feel like it is helping.  Describes his pain as sharp/stabbing.  Tells me this is different from his chronic prostatitis which feels more "inner".  (Apparently he has been having prostatitis since age of 56 and has only seen a urologist once in his life at least 25 years ago, three episodes in the past year)    Denies fever, chills or blood in his stool.  Past Medical History:  Diagnosis Date  . Anxiety    Panic attacks  . Chronic prostatitis   . Depression   . Diabetes mellitus without complication ([place])   . ED (erectile dysfunction)   . Epididymitis   . GERD (gastroesophageal reflux disease)   . Hyperlipidemia   . Migraines   . OSA (obstructive sleep apnea) 02/01/12   SLEEP STUDY [facility]  . RBBB     Past Surgical History:  Procedure Laterality Date  . COLONOSCOPY  2001   normal per pt. in [employer]  . SHOULDER ARTHROSCOPY  09/2005   left  . SHOULDER ARTHROSCOPY  01/2006   right  . UMBILICAL HERNIA REPAIR  01/2009  . UVULOPALATOPHARYNGOPLASTY  08/2001    Current Outpatient Medications  Medication Sig Dispense Refill  .  atorvastatin (LIPITOR) 40 MG tablet TAKE 1 TABLET DAILY 90 tablet 4  . clonazePAM (KLONOPIN) 0.5 MG tablet TAKE 1 TABLET(0.5 MG) BY MOUTH TWICE DAILY AS NEEDED FOR ANXIETY 60 tablet 2  . diltiazem 2 % GEL Apply 1 application topically 3 (three) times daily. 60 g 3  . Dulaglutide (TRULICITY) 1.5 0000000 SOPN Inject 1.5 mg into the skin once a week. 12 pen 4  . lansoprazole (PREVACID) 30 MG capsule TAKE 1 CAPSULE DAILY BEFORE A MEAL 90 capsule 3  . levothyroxine (SYNTHROID) 75 MCG tablet TAKE 1 TABLET(75 MCG) BY MOUTH DAILY 90 tablet 3  . metFORMIN (GLUCOPHAGE) 500 MG tablet TAKE 2 TABLETS (1000 MG TOTAL) TWICE A DAY WITH A MEAL 360 tablet 3  . Multiple Vitamin (MULTIVITAMIN) tablet Take 1 tablet by mouth daily.    Marland Kitchen rOPINIRole (REQUIP) 0.5 MG tablet Take 1 tablet  (0.5 mg total) by mouth at bedtime. 30 tablet 1  . SYRINGE-NEEDLE, DISP, 3 ML (LUER LOCK SAFETY SYRINGES) 23G X 1" 3 ML MISC [place] with Testosterone injection q 2 weeks 50 each 2  . testosterone cypionate (DEPOTESTOSTERONE CYPIONATE) 200 MG/ML injection INJECT 1ML INTO THE MUSCLE EVERY 14 DAYS 10 mL 3  . UNABLE TO FIND Med Name:CPAP THERAPHY    . vardenafil (LEVITRA) 20 MG tablet Take 20 mg by mouth daily as needed.      . vortioxetine HBr (TRINTELLIX) 20 MG TABS tablet Take 1 tablet (20 mg total) by mouth daily. 90 tablet 1   No current facility-administered medications for this visit.    Allergies as of 02/05/2019  . (No Known Allergies)    Family History  Problem Relation Age of Onset  . Muscular dystrophy Mother   . Cancer Father        lekeumia  . Cancer Sister        skin    Social History   Socioeconomic History  . Marital status: Married    Spouse name: Not on file  . Number of children: Not on file  . Years of education: Not on file  . Highest education level: Not on file  Occupational History  . Not on file  Tobacco Use  . Smoking status: Never Smoker  . Smokeless tobacco: Never Used  Substance and Sexual Activity  . Alcohol use: Yes    Alcohol/week: 2.0 standard drinks    Types: 2 Standard drinks or equivalent per week    Comment: socail 1 drink per week per pt  . Drug use: No  . Sexual activity: Not on file  Other Topics Concern  . Not on file  Social History Narrative  . Not on file   Social Determinants of Health   Financial Resource Strain:   . Difficulty of Paying Living Expenses: Not on file  Food Insecurity:   . Worried About Charity fundraiser in the Last Year: Not on file  . Ran Out of Food in the Last Year: Not on file  Transportation Needs:   . Lack of Transportation (Medical): Not on file  . Lack of Transportation (Non-Medical): Not on file  Physical Activity:   . Days of Exercise per Week: Not on file  . Minutes of Exercise per  Session: Not on file  Stress:   . Feeling of Stress : Not on file  Social Connections:   . Frequency of Communication with Friends and Family: Not on file  . Frequency of Social Gatherings with Friends and Family: Not on file  .  Attends Religious Services: Not on file  . Active Member of Clubs or Organizations: Not on file  . Attends Archivist Meetings: Not on file  . Marital Status: Not on file  Intimate Partner Violence:   . Fear of Current or Ex-Partner: Not on file  . Emotionally Abused: Not on file  . Physically Abused: Not on file  . Sexually Abused: Not on file    Review of Systems:    Constitutional: No weight loss, fever or chills Skin: No rash  Cardiovascular: No chest pain   Respiratory: No SOB Gastrointestinal: See HPI and otherwise negative Genitourinary: No dysuria  Neurological: No headache, dizziness or syncope Musculoskeletal: No new muscle or joint pain Hematologic: No bleeding  Psychiatric: No history of depression or anxiety   Physical Exam:  Vital signs: BP 122/80   Pulse 68   Temp (!) 96.4 F (35.8 C)   Ht 5\' 11"  (1.803 m)   Wt 201 lb 8 oz (91.4 kg)   BMI 28.10 kg/m   Constitutional:   Pleasant Caucasian male appears to be in NAD, Well developed, Well nourished, alert and cooperative Head:  Normocephalic and atraumatic. Eyes:   PEERL, EOMI. No icterus. Conjunctiva pink. Ears:  Normal auditory acuity. Neck:  Supple Throat: Oral cavity and pharynx without inflammation, swelling or lesion.  Respiratory: Respirations even and unlabored. Lungs clear to auscultation bilaterally.   No wheezes, crackles, or rhonchi.  Cardiovascular: Normal S1, S2. No MRG. Regular rate and rhythm. No peripheral edema, cyanosis or pallor.  Gastrointestinal:  Soft, nondistended, nontender. No rebound or guarding. Normal bowel sounds. No appreciable masses or hepatomegaly. Rectal:  External: no hemorrhoid, no fissure seen; Internal: increased sphincter tone, no  mass or lesion, no tenderness; Anoscopy: no fissure, Grade I hemorrhoids Msk:  Symmetrical without gross deformities. Without edema, no deformity or joint abnormality.  Neurologic:  Alert and  oriented x4;  grossly normal neurologically.  Skin:   Dry and intact without significant lesions or rashes. Psychiatric: Demonstrates good judgement and reason without abnormal affect or behaviors.  MOST RECENT LABS AND IMAGING: CBC    Component Value Date/Time   WBC 6.8 11/21/2018 0932   RBC 5.30 11/21/2018 0932   HGB 16.5 11/21/2018 0932   HCT 48.9 11/21/2018 0932   PLT 248 11/21/2018 0932   MCV 92.3 11/21/2018 0932   MCH 31.1 11/21/2018 0932   MCHC 33.7 11/21/2018 0932   RDW 12.3 11/21/2018 0932   LYMPHSABS 2,190 11/21/2018 0932   MONOABS 570 08/04/2016 0839   EOSABS 102 11/21/2018 0932   BASOSABS 82 11/21/2018 0932    CMP     Component Value Date/Time   NA 138 11/21/2018 0932   K 4.0 11/21/2018 0932   CL 102 11/21/2018 0932   CO2 26 11/21/2018 0932   GLUCOSE 137 (H) 11/21/2018 0932   BUN 17 11/21/2018 0932   CREATININE 0.74 11/21/2018 0932   CALCIUM 10.0 11/21/2018 0932   PROT 6.7 11/21/2018 0932   ALBUMIN 4.5 08/04/2016 0839   AST 14 11/21/2018 0932   ALT 22 11/21/2018 0932   ALKPHOS 80 08/04/2016 0839   BILITOT 0.5 11/21/2018 0932   GFRNONAA 105 11/21/2018 0932   GFRAA 121 11/21/2018 0932    Assessment: 1.  Rectal pain: History of recurrent proctitis as well as epididymitis, patient was recently treated for this given rectal pain, but continues with pain after multiple rounds of antibiotics and after Diltiazem given for suspected fissure, rectal exam today with no obvious fissure,  no etiology for continued rectal pain; concern for lesion higher up versus urological etiology versus other 2.  Change in bowel habits: Towards constipation and a change in stool caliber  Plan: 1.  Discussed options with the patient today.  He has been on Diltiazem for 10 weeks now with no  improvement and there is no obvious fissure on exam but continues with rectal pain.  He has also experienced a change in bowel habits and does have a known history of adenomatous polyps.  Would recommend we go ahead and proceed with repeat colonoscopy for diagnostic reasons.  Patient agreed. 2.  Scheduled patient for colonoscopy in the [place] with Dr. Loletha Carrow.  Did discuss risks, benefits, limitations and alternatives and the patient agrees to proceed.  Patient will be Covid tested 2 days prior to time of exam. 3.  Patient can discontinue Diltiazem for now as there is no obvious fissure 4.  Continue MiraLAX daily to help with constipation. 5.  Patient to follow in clinic per recommendations from Dr. Loletha Carrow after time of procedure.  If there is no GI origin could consider referral to urology?  Ellouise Newer, PA-C [facility] 02/05/2019, 11:26 AM  Cc: Susy Frizzle, MD

## 2019-02-07 LAB — NOVEL CORONAVIRUS, NAA: SARS-CoV-2, NAA: NOT DETECTED

## 2019-02-17 ENCOUNTER — Ambulatory Visit (INDEPENDENT_AMBULATORY_CARE_PROVIDER_SITE_OTHER)

## 2019-02-17 ENCOUNTER — Other Ambulatory Visit: Payer: Self-pay | Admitting: Gastroenterology

## 2019-02-17 DIAGNOSIS — Z1159 Encounter for screening for other viral diseases: Secondary | ICD-10-CM

## 2019-02-17 LAB — SARS CORONAVIRUS 2 (TAT 6-24 HRS): SARS Coronavirus 2: NEGATIVE

## 2019-02-19 ENCOUNTER — Encounter: Payer: Self-pay | Admitting: Gastroenterology

## 2019-02-19 ENCOUNTER — Ambulatory Visit (AMBULATORY_SURGERY_CENTER): Admitting: Gastroenterology

## 2019-02-19 ENCOUNTER — Other Ambulatory Visit: Payer: Self-pay

## 2019-02-19 VITALS — BP 115/74 | HR 67 | Temp 97.6°F | Resp 12 | Ht 71.0 in | Wt 201.0 lb

## 2019-02-19 DIAGNOSIS — K5909 Other constipation: Secondary | ICD-10-CM | POA: Diagnosis not present

## 2019-02-19 DIAGNOSIS — K6289 Other specified diseases of anus and rectum: Secondary | ICD-10-CM

## 2019-02-19 MED ORDER — SODIUM CHLORIDE 0.9 % IV SOLN: 500.0000 mL | Freq: Once | INTRAVENOUS | Status: DC

## 2019-02-19 NOTE — Progress Notes (Signed)
PT taken to PACU. Monitors in place. VSS. Report given to RN. 

## 2019-02-19 NOTE — Patient Instructions (Signed)
YOU HAD AN ENDOSCOPIC PROCEDURE TODAY AT THE [facility]:   Refer to the procedure report that was given to you for any specific questions about what was found during the examination.  If the procedure report does not answer your questions, please call your gastroenterologist to clarify.  If you requested that your care partner not be given the details of your procedure findings, then the procedure report has been included in a sealed envelope for you to review at your convenience later.  YOU SHOULD EXPECT: Some feelings of bloating in the abdomen. Passage of more gas than usual.  Walking can help get rid of the air that was put into your GI tract during the procedure and reduce the bloating. If you had a lower endoscopy (such as a colonoscopy or flexible sigmoidoscopy) you may notice spotting of blood in your stool or on the toilet paper. If you underwent a bowel prep for your procedure, you may not have a normal bowel movement for a few days.  Please Note:  You might notice some irritation and congestion in your nose or some drainage.  This is from the oxygen used during your procedure.  There is no need for concern and it should clear up in a day or so.  SYMPTOMS TO REPORT IMMEDIATELY:   Following lower endoscopy (colonoscopy or flexible sigmoidoscopy):  Excessive amounts of blood in the stool  Significant tenderness or worsening of abdominal pains  Swelling of the abdomen that is new, acute  Fever of 100F or higher  For urgent or emergent issues, a gastroenterologist can be reached at any hour by calling [phone].   DIET:  We do recommend a small meal at first, but then you may proceed to your regular diet.  Drink plenty of fluids but you should avoid alcoholic beverages for 24 hours.  ACTIVITY:  You should plan to take it easy for the rest of today and you should NOT DRIVE or use heavy machinery until tomorrow (because of the sedation medicines used during the test).     FOLLOW UP: Our staff will call the number listed on your records 48-72 hours following your procedure to check on you and address any questions or concerns that you may have regarding the information given to you following your procedure. If we do not reach you, we will leave a message.  We will attempt to reach you two times.  During this call, we will ask if you have developed any symptoms of COVID 19. If you develop any symptoms (ie: fever, flu-like symptoms, shortness of breath, cough etc.) before then, please call [phone].  If you test positive for Covid 19 in the 2 weeks post procedure, please call and report this information to us.    If any biopsies were taken you will be contacted by phone or by letter within the next 1-3 weeks.  Please call us at [phone] if you have not heard about the biopsies in 3 weeks.    SIGNATURES/CONFIDENTIALITY: You and/or your care partner have signed paperwork which will be entered into your electronic medical record.  These signatures attest to the fact that that the information above on your After Visit Summary has been reviewed and is understood.  Full responsibility of the confidentiality of this discharge information lies with you and/or your care-partner. 

## 2019-02-19 NOTE — Progress Notes (Signed)
[id number]

## 2019-02-19 NOTE — Op Note (Signed)
[facility] Patient Name: Jaramiah Jud Procedure Date: 02/19/2019 7:51 AM MRN: [id number] Endoscopist: [place]. Loletha Carrow , MD Age: 54 Referring MD:  Date of Birth: 1965/12/19 Gender: Male Account #: [id number] Procedure:                Colonoscopy Indications:              Pelvic pain, Constipation (also 29mm tubular adenoma                            polyp 12/2015) - chronic urologic symptoms as well Medicines:                Monitored Anesthesia Care Procedure:                Pre-Anesthesia Assessment:                           - Prior to the procedure, a History and Physical                            was performed, and patient medications and                            allergies were reviewed. The patient's tolerance of                            previous anesthesia was also reviewed. The risks                            and benefits of the procedure and the sedation                            options and risks were discussed with the patient.                            All questions were answered, and informed consent                            was obtained. Prior Anticoagulants: The patient has                            taken no previous anticoagulant or antiplatelet                            agents. ASA Grade Assessment: II - A patient with                            mild systemic disease. After reviewing the risks                            and benefits, the patient was deemed in                            satisfactory condition to undergo the procedure.  After obtaining informed consent, the colonoscope                            was passed under direct vision. Throughout the                            procedure, the patient's blood pressure, pulse, and                            oxygen saturations were monitored continuously. The                            Colonoscope was introduced through the anus and                            advanced  to the the cecum, identified by                            appendiceal orifice and ileocecal valve. The                            colonoscopy was performed without difficulty. The                            patient tolerated the procedure well. The quality                            of the bowel preparation was good. The ileocecal                            valve, appendiceal orifice, and rectum were                            photographed. Scope In: 8:00:59 AM Scope Out: 8:17:15 AM Scope Withdrawal Time: 0 hours 10 minutes 35 seconds  Total Procedure Duration: 0 hours 16 minutes 16 seconds  Findings:                 The perianal and digital rectal examinations were                            normal.                           The entire examined colon appeared normal on direct                            and retroflexion views. Complications:            No immediate complications. Estimated Blood Loss:     Estimated blood loss: none. Impression:               - The entire examined colon is normal on direct and                            retroflexion views.                           -  No specimens collected. Recommendation:           - Patient has a contact number available for                            emergencies. The signs and symptoms of potential                            delayed complications were discussed with the                            patient. Return to normal activities tomorrow.                            Written discharge instructions were provided to the                            patient.                           - Resume previous diet.                           - Continue present medications.                           - Repeat colonoscopy in 10 years for screening                            purposes.                           - Perform anal manometry.                           - PCP to consider urology referral. Estill Cotta. Loletha Carrow, MD 02/19/2019 8:25:49 AM This  report has been signed electronically.

## 2019-02-20 ENCOUNTER — Telehealth: Payer: Self-pay | Admitting: *Deleted

## 2019-02-20 NOTE — Telephone Encounter (Signed)
-----   Message from Doran Stabler, MD sent at 02/19/2019  5:19 PM EST ----- Patient had colonoscopy with me today.  He needs an anorectal manometry study for anorectal pain and constipation.  If that cannot be done right now due to Covid related restrictions on procedures in the outpatient endoscopy department, then he must be put on the wait list for when those restrictions are lifted.  - HD

## 2019-02-20 NOTE — Telephone Encounter (Signed)
Staff message sent to myself to schedule the patient for his anorectal mano when the restrictions have been lifted.

## 2019-02-21 ENCOUNTER — Telehealth: Payer: Self-pay

## 2019-02-21 NOTE — Telephone Encounter (Signed)
  Follow up Call-  Call back number 02/19/2019  Post procedure Call Back phone  # [phone]  Permission to leave phone message Yes  Some recent data might be hidden     Patient questions:  Do you have a fever, pain , or abdominal swelling? No. Pain Score  0 *  Have you tolerated food without any problems? Yes.    Have you been able to return to your normal activities? Yes.    Do you have any questions about your discharge instructions: Diet   No. Medications  No. Follow up visit  No.  Do you have questions or concerns about your Care? No.  Actions: * If pain score is 4 or above: No action needed, pain <4.  1. Have you developed a fever since your procedure? no  2.   Have you had an respiratory symptoms (SOB or cough) since your procedure? no  3.   Have you tested positive for COVID 19 since your procedure no  4.   Have you had any family members/close contacts diagnosed with the COVID 19 since your procedure?  no   If yes to any of these questions please route to Joylene John, RN and Alphonsa Gin, Therapist, sports.

## 2019-02-27 ENCOUNTER — Other Ambulatory Visit: Payer: Self-pay | Admitting: Family Medicine

## 2019-02-28 ENCOUNTER — Encounter: Payer: Self-pay | Admitting: *Deleted

## 2019-02-28 ENCOUNTER — Other Ambulatory Visit: Payer: Self-pay | Admitting: *Deleted

## 2019-02-28 DIAGNOSIS — K5909 Other constipation: Secondary | ICD-10-CM

## 2019-02-28 DIAGNOSIS — K6289 Other specified diseases of anus and rectum: Secondary | ICD-10-CM

## 2019-02-28 NOTE — Telephone Encounter (Signed)
Spoke to the patient who has been scheduled for the following:   03/11/19 at 2:15 pm COVID screening  03/14/19 at 10:30 am anorectal mano  MyChart message sent to patient. Patient verbalized understanding. No further questions.

## 2019-02-28 NOTE — Telephone Encounter (Signed)
Ok to refill??  Last office visit 12/13/2018.   Last refill 11/21/2018, #2 refills.

## 2019-03-04 ENCOUNTER — Other Ambulatory Visit: Payer: Self-pay | Admitting: Family Medicine

## 2019-03-11 ENCOUNTER — Other Ambulatory Visit (HOSPITAL_COMMUNITY)
Admission: RE | Admit: 2019-03-11 | Discharge: 2019-03-11 | Disposition: A | Discharge status: Home / Self Care | Source: Ambulatory Visit | Attending: Gastroenterology | Admitting: Gastroenterology

## 2019-03-11 DIAGNOSIS — K5900 Constipation, unspecified: Secondary | ICD-10-CM | POA: Diagnosis not present

## 2019-03-11 DIAGNOSIS — Z01812 Encounter for preprocedural laboratory examination: Secondary | ICD-10-CM | POA: Diagnosis present

## 2019-03-11 DIAGNOSIS — K6289 Other specified diseases of anus and rectum: Secondary | ICD-10-CM | POA: Diagnosis not present

## 2019-03-11 DIAGNOSIS — Z20822 Contact with and (suspected) exposure to covid-19: Secondary | ICD-10-CM | POA: Diagnosis not present

## 2019-03-11 LAB — SARS CORONAVIRUS 2 (TAT 6-24 HRS): SARS Coronavirus 2: NEGATIVE

## 2019-03-14 ENCOUNTER — Encounter (HOSPITAL_COMMUNITY)
Admission: RE | Disposition: A | Discharge status: Home / Self Care | Payer: Self-pay | Source: Home / Self Care | Attending: Gastroenterology

## 2019-03-14 ENCOUNTER — Encounter (HOSPITAL_COMMUNITY): Payer: Self-pay | Admitting: Gastroenterology

## 2019-03-14 ENCOUNTER — Ambulatory Visit (HOSPITAL_COMMUNITY)
Admission: RE | Admit: 2019-03-14 | Discharge: 2019-03-14 | Disposition: A | Discharge status: Home / Self Care | Attending: Gastroenterology | Admitting: Gastroenterology

## 2019-03-14 DIAGNOSIS — K5902 Outlet dysfunction constipation: Secondary | ICD-10-CM | POA: Diagnosis not present

## 2019-03-14 DIAGNOSIS — K6289 Other specified diseases of anus and rectum: Secondary | ICD-10-CM | POA: Diagnosis not present

## 2019-03-14 DIAGNOSIS — Z01812 Encounter for preprocedural laboratory examination: Secondary | ICD-10-CM | POA: Insufficient documentation

## 2019-03-14 DIAGNOSIS — Z20822 Contact with and (suspected) exposure to covid-19: Secondary | ICD-10-CM | POA: Insufficient documentation

## 2019-03-14 DIAGNOSIS — K5900 Constipation, unspecified: Secondary | ICD-10-CM | POA: Insufficient documentation

## 2019-03-14 HISTORY — PX: ANAL RECTAL MANOMETRY: SHX6358

## 2019-03-14 SURGERY — MANOMETRY, ANORECTAL
Anesthesia: Choice

## 2019-03-14 NOTE — Progress Notes (Signed)
2D anorectal manometry performed per protocol.  Patient tolerated well.  Balloon expulsion test then performed with 50 cc balloon expelled at 80 seconds.  Report sent to Dr. Harl Bowie.

## 2019-03-19 ENCOUNTER — Encounter: Payer: Self-pay | Admitting: Family Medicine

## 2019-03-19 ENCOUNTER — Other Ambulatory Visit: Payer: Self-pay | Admitting: *Deleted

## 2019-03-19 ENCOUNTER — Other Ambulatory Visit: Payer: Self-pay | Admitting: Family Medicine

## 2019-03-19 DIAGNOSIS — E119 Type 2 diabetes mellitus without complications: Secondary | ICD-10-CM

## 2019-03-19 DIAGNOSIS — E039 Hypothyroidism, unspecified: Secondary | ICD-10-CM

## 2019-03-19 DIAGNOSIS — E785 Hyperlipidemia, unspecified: Secondary | ICD-10-CM

## 2019-03-19 DIAGNOSIS — Z79899 Other long term (current) drug therapy: Secondary | ICD-10-CM

## 2019-03-21 DIAGNOSIS — K5902 Outlet dysfunction constipation: Secondary | ICD-10-CM

## 2019-03-21 DIAGNOSIS — K6289 Other specified diseases of anus and rectum: Secondary | ICD-10-CM

## 2019-03-31 ENCOUNTER — Other Ambulatory Visit: Payer: Self-pay

## 2019-03-31 ENCOUNTER — Other Ambulatory Visit: Payer: Self-pay | Admitting: *Deleted

## 2019-03-31 MED ORDER — AMBULATORY NON FORMULARY MEDICATION: 0 refills | Status: DC

## 2019-03-31 NOTE — Telephone Encounter (Signed)
Please contact him and explained that nitroglycerin ointment is different than diltiazem.  If he is agreeable, send a prescription to [facility] for nitroglycerin ointment, 0.125% as they usually compounded for [place]. 3 times daily.  Patient's note indicates he would prefer to hold off on biofeedback therapy.

## 2019-04-02 ENCOUNTER — Encounter: Payer: Self-pay | Admitting: *Deleted

## 2019-04-02 ENCOUNTER — Telehealth: Payer: Self-pay | Admitting: Gastroenterology

## 2019-04-02 NOTE — Telephone Encounter (Signed)
Re-faxed script to [facility]. Called to confirm they have received the prescription. They will start mixing today and it will be ready tomorrow. Patient notified via [place].

## 2019-04-08 ENCOUNTER — Encounter: Payer: Self-pay | Admitting: Family Medicine

## 2019-04-08 MED ORDER — LEVOTHYROXINE SODIUM 75 MCG PO TABS: ORAL_TABLET | ORAL | 3 refills | Status: DC

## 2019-04-09 ENCOUNTER — Other Ambulatory Visit

## 2019-04-09 DIAGNOSIS — E785 Hyperlipidemia, unspecified: Secondary | ICD-10-CM

## 2019-04-09 DIAGNOSIS — E119 Type 2 diabetes mellitus without complications: Secondary | ICD-10-CM

## 2019-04-09 DIAGNOSIS — E039 Hypothyroidism, unspecified: Secondary | ICD-10-CM

## 2019-04-10 ENCOUNTER — Encounter: Payer: Self-pay | Admitting: Family Medicine

## 2019-04-10 LAB — COMPLETE METABOLIC PANEL WITH GFR
AG Ratio: 2 (calc) (ref 1.0–2.5)
ALT: 25 U/L (ref 9–46)
AST: 15 U/L (ref 10–35)
Albumin: 4.2 g/dL (ref 3.6–5.1)
Alkaline phosphatase (APISO): 100 U/L (ref 35–144)
BUN: 7 mg/dL (ref 7–25)
CO2: 26 mmol/L (ref 20–32)
Calcium: 9.3 mg/dL (ref 8.6–10.3)
Chloride: 102 mmol/L (ref 98–110)
Creat: 0.73 mg/dL (ref 0.70–1.33)
GFR, Est African American: 122 mL/min/{1.73_m2} (ref >=60)
GFR, Est Non African American: 105 mL/min/{1.73_m2} (ref >=60)
Globulin: 2.1 g/dL (calc) (ref 1.9–3.7)
Glucose, Bld: 203 mg/dL — ABNORMAL HIGH (ref 65–99)
Potassium: 4.5 mmol/L (ref 3.5–5.3)
Sodium: 138 mmol/L (ref 135–146)
Total Bilirubin: 0.5 mg/dL (ref 0.2–1.2)
Total Protein: 6.3 g/dL (ref 6.1–8.1)

## 2019-04-10 LAB — CBC WITH DIFFERENTIAL/PLATELET
Absolute Monocytes: 552 cells/uL (ref 200–950)
Basophils Absolute: 102 cells/uL (ref 0–200)
Basophils Relative: 1.7 %
Eosinophils Absolute: 162 cells/uL (ref 15–500)
Eosinophils Relative: 2.7 %
HCT: 46.8 % (ref 38.5–50.0)
Hemoglobin: 16.1 g/dL (ref 13.2–17.1)
Lymphs Abs: 2226 cells/uL (ref 850–3900)
MCH: 31.6 pg (ref 27.0–33.0)
MCHC: 34.4 g/dL (ref 32.0–36.0)
MCV: 91.8 fL (ref 80.0–100.0)
MPV: 10.1 fL (ref 7.5–12.5)
Monocytes Relative: 9.2 %
Neutro Abs: 2958 cells/uL (ref 1500–7800)
Neutrophils Relative %: 49.3 %
Platelets: 313 10*3/uL (ref 140–400)
RBC: 5.1 10*6/uL (ref 4.20–5.80)
RDW: 11.4 % (ref 11.0–15.0)
Total Lymphocyte: 37.1 %
WBC: 6 10*3/uL (ref 3.8–10.8)

## 2019-04-10 LAB — LIPID PANEL
Cholesterol: 129 mg/dL (ref <200)
HDL: 42 mg/dL (ref >=40)
LDL Cholesterol (Calc): 72 mg/dL (calc)
Non-HDL Cholesterol (Calc): 87 mg/dL (calc) (ref <130)
Total CHOL/HDL Ratio: 3.1 (calc) (ref <5.0)
Triglycerides: 66 mg/dL (ref <150)

## 2019-04-10 LAB — TSH: TSH: 2.89 mIU/L (ref 0.40–4.50)

## 2019-04-10 LAB — HEMOGLOBIN A1C
Hgb A1c MFr Bld: 9.3 % of total Hgb — ABNORMAL HIGH (ref <5.7)
Mean Plasma Glucose: 220 (calc)
eAG (mmol/L): 12.2 (calc)

## 2019-04-14 ENCOUNTER — Ambulatory Visit (INDEPENDENT_AMBULATORY_CARE_PROVIDER_SITE_OTHER): Admitting: Family Medicine

## 2019-04-14 ENCOUNTER — Other Ambulatory Visit: Payer: Self-pay

## 2019-04-14 VITALS — BP 126/76 | HR 66 | Temp 96.5°F | Resp 14 | Ht 71.0 in | Wt 201.0 lb

## 2019-04-14 DIAGNOSIS — E1165 Type 2 diabetes mellitus with hyperglycemia: Secondary | ICD-10-CM | POA: Diagnosis not present

## 2019-04-14 DIAGNOSIS — E785 Hyperlipidemia, unspecified: Secondary | ICD-10-CM | POA: Diagnosis not present

## 2019-04-14 DIAGNOSIS — E039 Hypothyroidism, unspecified: Secondary | ICD-10-CM

## 2019-04-14 MED ORDER — ACCU-CHEK GUIDE VI STRP: ORAL_STRIP | 3 refills | Status: DC

## 2019-04-14 MED ORDER — ACCU-CHEK GUIDE W/DEVICE KIT: 1.0000 | PACK | Freq: Two times a day (BID) | 1 refills | Status: DC

## 2019-04-14 MED ORDER — ACCU-CHEK FASTCLIX LANCET KIT: PACK | 1 refills | Status: AC

## 2019-04-14 MED ORDER — JARDIANCE 25 MG PO TABS: 25.0000 mg | ORAL_TABLET | Freq: Every day | ORAL | 3 refills | Status: DC

## 2019-04-14 MED ORDER — JARDIANCE 25 MG PO TABS: 25.0000 mg | ORAL_TABLET | Freq: Every day | ORAL | 0 refills | Status: DC

## 2019-04-14 MED ORDER — ACCU-CHEK FASTCLIX LANCETS MISC: 3 refills | Status: DC

## 2019-04-14 NOTE — Progress Notes (Signed)
Subjective:    Patient ID: Mike Henderson, male    DOB: 04/13/1965, 54 y.o.   MRN: [id number]  Patient is here today for a regular checkup.  His most recent lab work is listed below: Appointment on 04/09/2019  Component Date Value Ref Range Status  . TSH 04/09/2019 2.89  0.40 - 4.50 mIU/L Final  . Cholesterol 04/09/2019 129  <200 mg/dL Final  . HDL 04/09/2019 42  > OR = 40 mg/dL Final  . Triglycerides 04/09/2019 66  <150 mg/dL Final  . LDL Cholesterol (Calc) 04/09/2019 72  mg/dL (calc) Final   Comment: Reference range: <100 . Desirable range <100 mg/dL for primary prevention;   <70 mg/dL for patients with CHD or diabetic patients  with > or = 2 CHD risk factors. Marland Kitchen LDL-C is now calculated using the Martin-Hopkins  calculation, which is a validated novel method providing  better accuracy than the Friedewald equation in the  estimation of LDL-C.  Cresenciano Genre et al. Annamaria Helling. [id number]): 2061-2068  (http://education.[facility].com/faq/FAQ164)   . Total CHOL/HDL Ratio 04/09/2019 3.1  <5.0 (calc) Final  . Non-HDL Cholesterol (Calc) 04/09/2019 87  <130 mg/dL (calc) Final   Comment: For patients with diabetes plus 1 major ASCVD risk  factor, treating to a non-HDL-C goal of <100 mg/dL  (LDL-C of <70 mg/dL) is considered a therapeutic  option.   . Hgb A1c MFr Bld 04/09/2019 9.3* <5.7 % of total Hgb Final   Comment: For someone without known diabetes, a hemoglobin A1c value of 6.5% or greater indicates that they may have  diabetes and this should be confirmed with a follow-up  test. . For someone with known diabetes, a value <7% indicates  that their diabetes is well controlled and a value  greater than or equal to 7% indicates suboptimal  control. A1c targets should be individualized based on  duration of diabetes, age, comorbid conditions, and  other considerations. . Currently, no consensus exists regarding use of hemoglobin A1c for diagnosis of diabetes for  children. .   . Mean Plasma Glucose 04/09/2019 220  (calc) Final  . eAG (mmol/L) 04/09/2019 12.2  (calc) Final  . Glucose, Bld 04/09/2019 203* 65 - 99 mg/dL Final   Comment: .            Fasting reference interval . For someone without known diabetes, a glucose value >125 mg/dL indicates that they may have diabetes and this should be confirmed with a follow-up test. .   . BUN 04/09/2019 7  7 - 25 mg/dL Final  . Creat 04/09/2019 0.73  0.70 - 1.33 mg/dL Final   Comment: For patients >62 years of age, the reference limit for Creatinine is approximately 13% higher for people identified as African-American. .   . GFR, Est Non African American 04/09/2019 105  > OR = 60 mL/min/1.60m2 Final  . GFR, Est African American 04/09/2019 122  > OR = 60 mL/min/1.73m2 Final  . BUN/Creatinine Ratio 123456 NOT APPLICABLE  6 - 22 (calc) Final  . Sodium 04/09/2019 138  135 - 146 mmol/L Final  . Potassium 04/09/2019 4.5  3.5 - 5.3 mmol/L Final  . Chloride 04/09/2019 102  98 - 110 mmol/L Final  . CO2 04/09/2019 26  20 - 32 mmol/L Final  . Calcium 04/09/2019 9.3  8.6 - 10.3 mg/dL Final  . Total Protein 04/09/2019 6.3  6.1 - 8.1 g/dL Final  . Albumin 04/09/2019 4.2  3.6 - 5.1 g/dL Final  . Globulin 04/09/2019 2.1  1.9 - 3.7 g/dL (calc) Final  . AG Ratio 04/09/2019 2.0  1.0 - 2.5 (calc) Final  . Total Bilirubin 04/09/2019 0.5  0.2 - 1.2 mg/dL Final  . Alkaline phosphatase (APISO) 04/09/2019 100  35 - 144 U/L Final  . AST 04/09/2019 15  10 - 35 U/L Final  . ALT 04/09/2019 25  9 - 46 U/L Final  . WBC 04/09/2019 6.0  3.8 - 10.8 Thousand/uL Final  . RBC 04/09/2019 5.10  4.20 - 5.80 Million/uL Final  . Hemoglobin 04/09/2019 16.1  13.2 - 17.1 g/dL Final  . HCT 04/09/2019 46.8  38.5 - 50.0 % Final  . MCV 04/09/2019 91.8  80.0 - 100.0 fL Final  . MCH 04/09/2019 31.6  27.0 - 33.0 pg Final  . MCHC 04/09/2019 34.4  32.0 - 36.0 g/dL Final  . RDW 04/09/2019 11.4  11.0 - 15.0 % Final  . Platelets 04/09/2019  313  140 - 400 Thousand/uL Final  . MPV 04/09/2019 10.1  7.5 - 12.5 fL Final  . Neutro Abs 04/09/2019 2,958  1,500 - 7,800 cells/uL Final  . Lymphs Abs 04/09/2019 2,226  850 - 3,900 cells/uL Final  . Absolute Monocytes 04/09/2019 552  200 - 950 cells/uL Final  . Eosinophils Absolute 04/09/2019 162  15 - 500 cells/uL Final  . Basophils Absolute 04/09/2019 102  0 - 200 cells/uL Final  . Neutrophils Relative % 04/09/2019 49.3  % Final  . Total Lymphocyte 04/09/2019 37.1  % Final  . Monocytes Relative 04/09/2019 9.2  % Final  . Eosinophils Relative 04/09/2019 2.7  % Final  . Basophils Relative 04/09/2019 1.7  % Final   Unfortunately hemoglobin A1c has risen to 9.3.  Thankfully his TSH is normal.  His LDL cholesterol is well below 100.  His renal function and liver function tests are normal.  His CBC is excellent.  His blood pressure today is well controlled at 126/76.  He is currently on a combination of Metformin as well as Trulicity to help manage his blood sugars.  Recently he has been undergoing treatments for anal fissures.  We initially stopped his Jardiance due to concern about prostatitis however this appears to no longer be the source of his rectal pain. Past Medical History:  Diagnosis Date  . Anxiety    Panic attacks  . Arthritis   . Blood transfusion without reported diagnosis   . Chronic prostatitis   . Constipation   . Depression   . Diabetes mellitus without complication ([place])   . ED (erectile dysfunction)   . Epididymitis   . GERD (gastroesophageal reflux disease)   . Hyperlipidemia   . Migraines   . OSA (obstructive sleep apnea) 02/01/12   SLEEP STUDY [facility]  . RBBB   . Sleep apnea    uses Cpap  . Thyroid disease    hypothyroid   Past Surgical History:  Procedure Laterality Date  . ANAL RECTAL MANOMETRY N/A 03/14/2019   Procedure: ANO RECTAL MANOMETRY;  Surgeon: Doran Stabler, MD;  Location: [facility];  Service: Gastroenterology;   Laterality: N/A;  . COLONOSCOPY  2001   normal per pt. in [employer]  . ELBOW SURGERY Bilateral    for tennis elbow  . SHOULDER ARTHROSCOPY  09/2005   left  . SHOULDER ARTHROSCOPY  01/2006   right  . UMBILICAL HERNIA REPAIR  01/2009  . UVULOPALATOPHARYNGOPLASTY  08/2001   Current Outpatient Medications on File Prior to Visit  Medication Sig Dispense Refill  .  AMBULATORY NON FORMULARY MEDICATION Medication Name: Nitroglycerine ointment 0.125 %  Apply a pea sized amount internally three times daily. Dispense 30 GM zero refill 1 Tube 0  . atorvastatin (LIPITOR) 40 MG tablet TAKE 1 TABLET DAILY 90 tablet 3  . clonazePAM (KLONOPIN) 0.5 MG tablet TAKE 1 TABLET(0.5 MG) BY MOUTH TWICE DAILY AS NEEDED FOR ANXIETY 60 tablet 2  . Dulaglutide (TRULICITY) 1.5 0000000 SOPN Inject 1.5 mg into the skin once a week. 12 pen 4  . lansoprazole (PREVACID) 30 MG capsule TAKE 1 CAPSULE DAILY BEFORE A MEAL 90 capsule 3  . levothyroxine (SYNTHROID) 75 MCG tablet TAKE 1 TABLET(75 MCG) BY MOUTH DAILY 90 tablet 3  . metFORMIN (GLUCOPHAGE) 500 MG tablet TAKE 2 TABLETS (1000 MG TOTAL) TWICE A DAY WITH A MEAL 360 tablet 3  . Multiple Vitamin (MULTIVITAMIN) tablet Take 1 tablet by mouth daily.    . SYRINGE-NEEDLE, DISP, 3 ML (LUER LOCK SAFETY SYRINGES) 23G X 1" 3 ML MISC [place] with Testosterone injection q 2 weeks 50 each 2  . testosterone cypionate (DEPOTESTOSTERONE CYPIONATE) 200 MG/ML injection INJECT 1ML INTO THE MUSCLE EVERY 14 DAYS 10 mL 3  . TRINTELLIX 20 MG TABS tablet TAKE 1 TABLET DAILY 90 tablet 3  . UNABLE TO FIND Med Name:CPAP THERAPHY    . vardenafil (LEVITRA) 20 MG tablet Take 20 mg by mouth daily as needed.       No current facility-administered medications on file prior to visit.   No Known Allergies Social History   Socioeconomic History  . Marital status: Married    Spouse name: Not on file  . Number of children: 1  . Years of education: Not on file  . Highest education level: Not on file   Occupational History  . Not on file  Tobacco Use  . Smoking status: Former Smoker    Types: Cigars  . Smokeless tobacco: Never Used  . Tobacco comment: social  Substance and Sexual Activity  . Alcohol use: Yes    Alcohol/week: 2.0 standard drinks    Types: 2 Standard drinks or equivalent per week    Comment: socail 1 drink per week per pt  . Drug use: No  . Sexual activity: Not on file  Other Topics Concern  . Not on file  Social History Narrative  . Not on file   Social Determinants of Health   Financial Resource Strain:   . Difficulty of Paying Living Expenses:   Food Insecurity:   . Worried About Charity fundraiser in the Last Year:   . Arboriculturist in the Last Year:   Transportation Needs:   . Film/video editor (Medical):   Marland Kitchen Lack of Transportation (Non-Medical):   Physical Activity:   . Days of Exercise per Week:   . Minutes of Exercise per Session:   Stress:   . Feeling of Stress :   Social Connections:   . Frequency of Communication with Friends and Family:   . Frequency of Social Gatherings with Friends and Family:   . Attends Religious Services:   . Active Member of Clubs or Organizations:   . Attends Archivist Meetings:   Marland Kitchen Marital Status:   Intimate Partner Violence:   . Fear of Current or Ex-Partner:   . Emotionally Abused:   Marland Kitchen Physically Abused:   . Sexually Abused:       Review of Systems  All other systems reviewed and are negative.  Objective:   Physical Exam  Constitutional: He appears well-developed and well-nourished. No distress.  Eyes: EOM are normal.  Cardiovascular: Normal rate, regular rhythm and normal heart sounds.  No murmur heard. Pulmonary/Chest: Effort normal and breath sounds normal.  Skin: He is not diaphoretic.  Vitals reviewed.         Assessment & Plan:  Hypothyroidism, unspecified type  Hyperlipidemia, unspecified hyperlipidemia type  Uncontrolled type 2 diabetes mellitus with  hyperglycemia ([place])  Spent our entire visit today in discussion.  Spent more than 20 minutes with the patient discussing his options.  Recommended continuing Metformin and Trulicity and adding Jardiance 25 mg a day.  He will recheck with me in 1 month via telephone with fasting blood sugars and 2-hour postprandial sugars.  If they are elevated I would recommend adding either Actos or glipizide next and then rechecking his blood sugars again 1 month after that.  Patient will begin checking his blood sugar twice a day fasting and 2-hour postprandially.  Ideally I like his fasting blood sugar between 80 and 130.  I would like his 2-hour postprandial sugars to be between 80 and 160.  Also recommended 30 minutes to 1 hour a day of aerobic exercise and any weight loss will be beneficial.  Blood pressure today is outstanding and the remainder of his lab work including his fasting lipid panel and TSH were excellent.

## 2019-04-16 ENCOUNTER — Encounter: Payer: Self-pay | Admitting: Family Medicine

## 2019-04-16 ENCOUNTER — Other Ambulatory Visit: Payer: Self-pay | Admitting: Family Medicine

## 2019-04-16 MED ORDER — FREESTYLE LITE DEVI: 3 refills | Status: DC

## 2019-04-16 MED ORDER — FREESTYLE LITE TEST VI STRP: ORAL_STRIP | 3 refills | Status: DC

## 2019-05-02 ENCOUNTER — Encounter: Payer: Self-pay | Admitting: Family Medicine

## 2019-05-03 ENCOUNTER — Encounter: Payer: Self-pay | Admitting: Family Medicine

## 2019-05-05 MED ORDER — TRULICITY 1.5 MG/0.5ML ~~LOC~~ SOAJ: 1.5000 mg | SUBCUTANEOUS | 4 refills | Status: DC

## 2019-06-01 ENCOUNTER — Other Ambulatory Visit: Payer: Self-pay | Admitting: Family Medicine

## 2019-06-02 NOTE — Telephone Encounter (Signed)
Requesting refill  Klonopin  LOV:  04/14/2019  LRF:  02/28/2019

## 2019-06-05 ENCOUNTER — Encounter: Payer: Self-pay | Admitting: Family Medicine

## 2019-06-05 DIAGNOSIS — G4733 Obstructive sleep apnea (adult) (pediatric): Secondary | ICD-10-CM

## 2019-06-09 ENCOUNTER — Other Ambulatory Visit: Payer: Self-pay | Admitting: Family Medicine

## 2019-06-13 ENCOUNTER — Encounter: Payer: Self-pay | Admitting: Family Medicine

## 2019-06-18 ENCOUNTER — Other Ambulatory Visit: Payer: Self-pay

## 2019-06-18 ENCOUNTER — Ambulatory Visit (INDEPENDENT_AMBULATORY_CARE_PROVIDER_SITE_OTHER): Admitting: Cardiovascular Disease

## 2019-06-18 ENCOUNTER — Telehealth: Payer: Self-pay | Admitting: *Deleted

## 2019-06-18 VITALS — BP 126/84 | HR 59 | Ht 71.0 in | Wt 193.0 lb

## 2019-06-18 DIAGNOSIS — E039 Hypothyroidism, unspecified: Secondary | ICD-10-CM | POA: Diagnosis not present

## 2019-06-18 DIAGNOSIS — I4510 Unspecified right bundle-branch block: Secondary | ICD-10-CM | POA: Diagnosis not present

## 2019-06-18 DIAGNOSIS — G4733 Obstructive sleep apnea (adult) (pediatric): Secondary | ICD-10-CM

## 2019-06-18 DIAGNOSIS — E119 Type 2 diabetes mellitus without complications: Secondary | ICD-10-CM

## 2019-06-18 NOTE — Telephone Encounter (Signed)
Order placed to [facility] via community message 

## 2019-06-18 NOTE — Telephone Encounter (Signed)
-----   Message from Darlyn Chamber June, RN sent at 06/18/2019 10:12 AM EDT ----- Regarding: new cpap machine Pt needs a resmed airsense 10 CPAP auto with a range of 10-18 He is with [facility]. Thank you!

## 2019-06-18 NOTE — Progress Notes (Signed)
Cardiology Office Note    Date:  06/25/2019   ID:  Mike Henderson, DOB [birth year], MRN [id number]  PCP:  Mike Frizzle, MD  Cardiologist:  Mike Majestic, MD   Chief Complaint  Patient presents with  . New Patient (Initial Visit)    History of Present Illness:  Mike Henderson is a 54 y.o. male who is followed by Dr. Jenna Henderson for primary care.  Patient has a history of obstructive sleep apnea.  I had seen the patient in May 2014 and have not seen him since.  He presents to the office today to reestablish care.  Mike Henderson is retired from the [employer].  He was diagnosed with obstructive sleep apnea in 2003 while in [place] surgery which did initially improve his sleep pattern.  In 2007 he had another sleep study at the [facility] hospital but apparently was never told of the results apparently was necessary.  I had seen him in 2014 when he had noticed progressive sleep issues.  His sleep was disruptive, he was experiencing frequent nocturia, morning and migraine headaches, increased blood pressure as well as fatigue.  An Epworth Sleepiness Scale score was 17.  He underwent a follow-up sleep study and was found to have mild overall sleep apnea with an AHI of 7.6/h and 10.5/h.  However during REM sleep, apnea was severe with an AHI of 37.3/h.  CPAP titration was performed on March 07, 2012 and he was titrated to 12 cm water pressure with excellent response.  When I saw him in 2014 is down and was excellent and AHI was 0.5 at a 12 cm water pressure.  His Epworth Sleepiness Scale score is significantly improved at 8.  Over the last 7 years, he has continued to use CPAP therapy.  He admits to a 40 pound weight loss.  He also has been diagnosed with diabetes mellitus for at least 4 years.  He previously had [facility] as his DME company which was brought out by [employer].  His only goes to bed between 10:11 PM and wakes up at 5 AM.  He admits to being 100% compliance.  At times  he still takes an afternoon nap.  He is on statin 40 mg daily for hyperlipidemia, levothyroxine 75 mcg for hypothyroidism, and is on Jardiance and Trulicity for his diabetes mellitus.  His scale score was recalculated in the office today and this endorsed at 7 arguing against excessive daytime sleepiness.   Past Medical History:  Diagnosis Date  . Anxiety    Panic attacks  . Arthritis   . Blood transfusion without reported diagnosis   . Chronic prostatitis   . Constipation   . Depression   . Diabetes mellitus without complication ([place])   . ED (erectile dysfunction)   . Epididymitis   . GERD (gastroesophageal reflux disease)   . Hyperlipidemia   . Migraines   . OSA (obstructive sleep apnea) 02/01/12   SLEEP STUDY [facility]  . RBBB   . Sleep apnea    uses Cpap  . Thyroid disease    hypothyroid    Past Surgical History:  Procedure Laterality Date  . ANAL RECTAL MANOMETRY N/A 03/14/2019   Procedure: ANO RECTAL MANOMETRY;  Surgeon: Doran Stabler, MD;  Location: [facility];  Service: Gastroenterology;  Laterality: N/A;  . COLONOSCOPY  2001   normal per pt. in [employer]  . ELBOW SURGERY Bilateral    for tennis elbow  .  SHOULDER ARTHROSCOPY  09/2005   left  . SHOULDER ARTHROSCOPY  01/2006   right  . UMBILICAL HERNIA REPAIR  01/2009  . UVULOPALATOPHARYNGOPLASTY  08/2001    Current Medications: Outpatient Medications Prior to Visit  Medication Sig Dispense Refill  . Accu-Chek FastClix Lancets MISC Use to check BS bid DX: E11.9 200 each 3  . AMBULATORY NON FORMULARY MEDICATION Medication Name: Nitroglycerine ointment 0.125 %  Apply a pea sized amount internally three times daily. Dispense 30 GM zero refill 1 Tube 0  . atorvastatin (LIPITOR) 40 MG tablet TAKE 1 TABLET DAILY 90 tablet 3  . Blood Glucose Monitoring Suppl (FREESTYLE LITE) DEVI Check BS bid E11.9 200 each 3  . clonazePAM (KLONOPIN) 0.5 MG tablet TAKE 1 TABLET(0.5 MG) BY MOUTH TWICE DAILY  AS NEEDED FOR ANXIETY 60 tablet 0  . clonazePAM (KLONOPIN) 0.5 MG tablet Take 0.5 mg by mouth in the morning and at bedtime.    . Dulaglutide (TRULICITY) 1.5 TT/0.1XB SOPN Inject 1.5 mg into the skin once a week. 12 pen 4  . empagliflozin (JARDIANCE) 25 MG TABS tablet Take 25 mg by mouth daily before breakfast. 90 tablet 3  . Lancets Misc. (ACCU-CHEK FASTCLIX LANCET) KIT Use to check BS bid DX: E11.9 1 kit 1  . lansoprazole (PREVACID) 30 MG capsule TAKE 1 CAPSULE DAILY BEFORE A MEAL 90 capsule 3  . levothyroxine (SYNTHROID) 75 MCG tablet TAKE 1 TABLET(75 MCG) BY MOUTH DAILY 90 tablet 3  . metFORMIN (GLUCOPHAGE) 500 MG tablet TAKE 2 TABLETS (1000 MG TOTAL) TWICE A DAY WITH A MEAL 360 tablet 3  . Multiple Vitamin (MULTIVITAMIN) tablet Take 1 tablet by mouth daily.    . SYRINGE-NEEDLE, DISP, 3 ML (LUER LOCK SAFETY SYRINGES) 23G X 1" 3 ML MISC [place] with Testosterone injection q 2 weeks 50 each 2  . testosterone cypionate (DEPOTESTOSTERONE CYPIONATE) 200 MG/ML injection INJECT 1ML INTO THE MUSCLE EVERY 14 DAYS 10 mL 3  . TRINTELLIX 20 MG TABS tablet TAKE 1 TABLET DAILY 90 tablet 3  . UNABLE TO FIND Med Name:CPAP THERAPHY    . vardenafil (LEVITRA) 20 MG tablet Take 20 mg by mouth daily as needed.      . empagliflozin (JARDIANCE) 25 MG TABS tablet Take 25 mg by mouth daily before breakfast. 30 tablet 0  . glucose blood (FREESTYLE LITE) test strip Check BS bid E11.9 200 each 3   No facility-administered medications prior to visit.     Allergies:   Patient has no known allergies.   Social History   Socioeconomic History  . Marital status: Married    Spouse name: Not on file  . Number of children: 1  . Years of education: Not on file  . Highest education level: Not on file  Occupational History  . Not on file  Tobacco Use  . Smoking status: Former Smoker    Types: Cigars  . Smokeless tobacco: Never Used  . Tobacco comment: social  Substance and Sexual Activity  . Alcohol use: Yes     Alcohol/week: 2.0 standard drinks    Types: 2 Standard drinks or equivalent per week    Comment: socail 1 drink per week per pt  . Drug use: No  . Sexual activity: Not on file  Other Topics Concern  . Not on file  Social History Narrative  . Not on file   Social Determinants of Health   Financial Resource Strain:   . Difficulty of Paying Living Expenses:   Food Insecurity:   .  Worried About Charity fundraiser in the Last Year:   . Arboriculturist in the Last Year:   Transportation Needs:   . Film/video editor (Medical):   Marland Kitchen Lack of Transportation (Non-Medical):   Physical Activity:   . Days of Exercise per Week:   . Minutes of Exercise per Session:   Stress:   . Feeling of Stress :   Social Connections:   . Frequency of Communication with Friends and Family:   . Frequency of Social Gatherings with Friends and Family:   . Attends Religious Services:   . Active Member of Clubs or Organizations:   . Attends Archivist Meetings:   Marland Kitchen Marital Status:     Social history is notable that he is originally from [place], [place].  He is married for 5 years.  He has 1 child.  History of cigar use.  He does not routinely exercise.  Family History:  The patient's family history includes Cancer in his father and sister; Kidney disease in his mother; Muscular dystrophy in his mother.   Died at age 73 and had kidney failure and multiple sclerosis living at age 30.  He has 1 sister age 64.  His 1 child is 8 years old.  ROS General: Negative; No fevers, chills, or night sweats;  HEENT: Negative; No changes in vision or hearing, sinus congestion, difficulty swallowing Pulmonary: Negative; No cough, wheezing, shortness of breath, hemoptysis Cardiovascular: Negative; No chest pain, presyncope, syncope, palpitations GI: Negative; No nausea, vomiting, diarrhea, or abdominal pain GU: Negative; No dysuria, hematuria, or difficulty voiding Musculoskeletal: Negative; no  myalgias, joint pain, or weakness Hematologic/Oncology: Negative; no easy bruising, bleeding Endocrine: Negative; no heat/cold intolerance; no diabetes Neuro: Negative; no changes in balance, headaches Skin: Negative; No rashes or skin lesions Psychiatric: Negative; No behavioral problems, depression Sleep: Negative; No snoring, daytime sleepiness, hypersomnolence, bruxism, restless legs, hypnogognic hallucinations, no cataplexy Other comprehensive 14 point system review is negative.   PHYSICAL EXAM:   VS:  BP 126/84 (BP Location: Left Arm, Patient Position: Sitting, Cuff Size: Normal)   Pulse (!) 59   Ht 5' 11" (1.803 m)   Wt 193 lb (87.5 kg)   BMI 26.92 kg/m     Repeat BP was 122/80  Wt Readings from Last 3 Encounters:  06/18/19 193 lb (87.5 kg)  04/14/19 201 lb (91.2 kg)  02/19/19 201 lb (91.2 kg)    General: Alert, oriented, no distress.  Skin: normal turgor, no rashes, warm and dry HEENT: Normocephalic, atraumatic. Pupils equal round and reactive to light; sclera anicteric; extraocular muscles intact;  Nose without nasal septal hypertrophy Mouth/Parynx benign; Mallinpatti scale 3 Neck: No JVD, no carotid bruits; normal carotid upstroke Lungs: clear to ausculatation and percussion; no wheezing or rales Chest wall: without tenderness to palpitation Heart: PMI not displaced, RRR, s1 s2 normal, 1/6 systolic murmur, no diastolic murmur, no rubs, gallops, thrills, or heaves Abdomen: soft, nontender; no hepatosplenomehaly, BS+; abdominal aorta nontender and not dilated by palpation. Back: no CVA tenderness Pulses 2+ Musculoskeletal: full range of motion, normal strength, no joint deformities Extremities: no clubbing cyanosis or edema, Homan's sign negative  Neurologic: grossly nonfocal; Cranial nerves grossly wnl Psychologic: Normal mood and affect   Studies/Labs Reviewed:   EKG:  EKG is ordered today.  ECG (independently read by me): Sinus bradycardia at 59 bpm, right  bundle branch block.  Q wave 3 and aVF  Recent Labs: BMP Latest Ref Rng & Units  04/09/2019 11/21/2018 05/20/2018  Glucose 65 - 99 mg/dL 203(H) 137(H) 137(H)  BUN 7 - 25 mg/dL _0 Creatinine 0.70 - 1.33 mg/dL 0.73 0.74 0.68(L)  BUN/Creat Ratio 6 - 22 (calc) NOT APPLICABLE NOT APPLICABLE 19(E)  Sodium 135 - 146 mmol/L 138 138 138  Potassium 3.5 - 5.3 mmol/L 4.5 4.0 4.4  Chloride 98 - 110 mmol/L 102 102 103  CO2 20 - 32 mmol/L _1 Calcium 8.6 - 10.3 mg/dL 9.3 10.0 9.5     Hepatic Function Latest Ref Rng & Units 04/09/2019 11/21/2018 05/20/2018  Total Protein 6.1 - 8.1 g/dL 6.3 6.7 7.1  Albumin 3.6 - 5.1 g/dL - - -  AST 10 - 35 U/L _2 ALT 9 - 46 U/L _3 Alk Phosphatase 40 - 115 U/L - - -  Total Bilirubin 0.2 - 1.2 mg/dL 0.5 0.5 0.5    CBC Latest Ref Rng & Units 04/09/2019 11/21/2018 05/20/2018  WBC 3.8 - 10.8 Thousand/uL 6.0 6.8 5.5  Hemoglobin 13.2 - 17.1 g/dL 16.1 16.5 16.4  Hematocrit 38.5 - 50.0 % 46.8 48.9 47.3  Platelets 140 - 400 Thousand/uL 313 248 285   Lab Results  Component Value Date   MCV 91.8 04/09/2019   MCV 92.3 11/21/2018   MCV 89.6 05/20/2018   Lab Results  Component Value Date   TSH 2.89 04/09/2019   Lab Results  Component Value Date   HGBA1C 9.3 (H) 04/09/2019     BNP No results found for: BNP  ProBNP No results found for: PROBNP   Lipid Panel     Component Value Date/Time   CHOL 129 04/09/2019 0844   TRIG 66 04/09/2019 0844   HDL 42 04/09/2019 0844   CHOLHDL 3.1 04/09/2019 0844   VLDL 13 08/04/2016 0839   LDLCALC 72 04/09/2019 0844     RADIOLOGY: No results found.   Additional studies/ records that were reviewed today include:  A download from April to May 2014 was reviewed.  [employer] this is new DME company which had bought [facility].  ASSESSMENT:    1. OSA (obstructive sleep apnea)   2. RBBB   3. Hypothyroidism, unspecified type   4. Type 2 diabetes mellitus without complication, without long-term  current use of insulin [facility])     PLAN:  Mr. Tavian Callander is a 54 year old gentleman who has a history of obstructive sleep apnea initially diagnosed in 2003 treated with UPPP surgery.  Due to progressive symptomatology in 2014 he was found to have mild overall sleep apnea which was severe during REM sleep with an AHI during REM sleep at 37.3/h.  He subsequently underwent a new CPAP titration currently since that time has been on a set 12 cm water pressure.  Patient states that he is sleeping well.  He has lost 40 pounds.  He has developed diabetes mellitus and is now on Metformin, Jardiance in addition to Trulicity.  He typically goes to bed between 10 - 11pm and wakes up at 5 AM.  I discussed with him the importance of optimal sleep duration at 7 to 8 hours.  In 2014, his CPAP was an S9 Elite set at 12 cm.  Presently, Mr. Kreider admits to 100 percent compliance and still takes a nap typically in the afternoon.  He qualifies for a new machine, and I have recommended a ResMed air sense 10 CPAP auto unit.  I will initially set him at a pressure of  10 to 18 cm.  I will see him for a follow-up visit in 3 months following initiation of his new machine.  His blood pressure today is stable.  He continues to be on levothyroxine for hypothyroidism.  He is on atorvastatin for hyperlipidemia.  Medication Adjustments/Labs and Tests Ordered: Current medicines are reviewed at length with the patient today.  Concerns regarding medicines are outlined above.  Medication changes, Labs and Tests ordered today are listed in the Patient Instructions below. Patient Instructions  Medication Instructions:  CONTINUE WITH CURRENT MEDICATIONS. NO CHANGES.  *If you need a refill on your cardiac medications before your next appointment, please call your pharmacy*  Follow-Up: At [facility], you and your health needs are our priority.  As part of our continuing mission to provide you with exceptional heart care, we  have created designated Provider Care Teams.  These Care Teams include your primary Cardiologist (physician) and Advanced Practice Providers (APPs -  Physician Assistants and Nurse Practitioners) who all work together to provide you with the care you need, when you need it.  We recommend signing up for the patient portal called "MyChart".  Sign up information is provided on this After Visit Summary.  MyChart is used to connect with patients for Virtual Visits (Telemedicine).  Patients are able to view lab/test results, encounter notes, upcoming appointments, etc.  Non-urgent messages can be sent to your provider as well.   To learn more about what you can do with MyChart, go to [email or web address].    Your next appointment:   3 month(s)  The format for your next appointment:    In Person  Provider:   Shelva Majestic, MD        Signed, Mike Majestic, MD  06/25/2019 6:43 PM    [facility] [address], [place], [place], [place]  [address] Phone: [phone]

## 2019-06-18 NOTE — Patient Instructions (Addendum)
Medication Instructions:  CONTINUE WITH CURRENT MEDICATIONS. NO CHANGES.  *If you need a refill on your cardiac medications before your next appointment, please call your pharmacy*     Follow-Up: At [facility], you and your health needs are our priority.  As part of our continuing mission to provide you with exceptional heart care, we have created designated Provider Care Teams.  These Care Teams include your primary Cardiologist (physician) and Advanced Practice Providers (APPs -  Physician Assistants and Nurse Practitioners) who all work together to provide you with the care you need, when you need it.  We recommend signing up for the patient portal called "MyChart".  Sign up information is provided on this After Visit Summary.  MyChart is used to connect with patients for Virtual Visits (Telemedicine).  Patients are able to view lab/test results, encounter notes, upcoming appointments, etc.  Non-urgent messages can be sent to your provider as well.   To learn more about what you can do with MyChart, go to [email or web address].    Your next appointment:   3 month(s)  The format for your next appointment:   In Person  Provider:   Thomas Kelly, MD    

## 2019-06-20 ENCOUNTER — Other Ambulatory Visit: Payer: Self-pay | Admitting: Family Medicine

## 2019-06-20 MED ORDER — FREESTYLE LITE TEST VI STRP: ORAL_STRIP | 3 refills | Status: DC

## 2019-07-01 ENCOUNTER — Other Ambulatory Visit: Payer: Self-pay

## 2019-07-01 ENCOUNTER — Ambulatory Visit (INDEPENDENT_AMBULATORY_CARE_PROVIDER_SITE_OTHER): Admitting: Family Medicine

## 2019-07-01 ENCOUNTER — Encounter: Payer: Self-pay | Admitting: Family Medicine

## 2019-07-01 VITALS — BP 118/70 | HR 63 | Temp 98.0°F | Ht 71.0 in | Wt 191.0 lb

## 2019-07-01 DIAGNOSIS — L03012 Cellulitis of left finger: Secondary | ICD-10-CM | POA: Diagnosis not present

## 2019-07-01 MED ORDER — SULFAMETHOXAZOLE-TRIMETHOPRIM 800-160 MG PO TABS: 1.0000 | ORAL_TABLET | Freq: Two times a day (BID) | ORAL | 0 refills | Status: DC

## 2019-07-01 NOTE — Progress Notes (Signed)
Subjective:    Patient ID: Mike Henderson, male    DOB: 1965/11/21, 54 y.o.   MRN: [id number]   10 days ago, the patient sustained a laceration to his left fifth digit around the PIP joint on the palmar aspect.  He was digging up a bush and lacerated his finger on part of the plant.  Went to an urgent care where he was working in the wound was cleaned out thoroughly and closed with 6 sutures.  Here today for suture removal.  The skin around the sutures is erythematous warm and tender.  He does have some numbness over the lateral aspect of his finger.  I remove the 6 sutures without difficulty and white pus emanated from the radial side of the laceration.  A wound culture was sent of this.  He appears to be developing a secondary infection. Past Medical History:  Diagnosis Date   Anxiety    Panic attacks   Arthritis    Blood transfusion without reported diagnosis    Chronic prostatitis    Constipation    Depression    Diabetes mellitus without complication ([facility])    ED (erectile dysfunction)    Epididymitis    GERD (gastroesophageal reflux disease)    Hyperlipidemia    Migraines    OSA (obstructive sleep apnea) 02/01/12   SLEEP STUDY [facility]   RBBB    Sleep apnea    uses Cpap   Thyroid disease    hypothyroid   Past Surgical History:  Procedure Laterality Date   ANAL RECTAL MANOMETRY N/A 03/14/2019   Procedure: ANO RECTAL MANOMETRY;  Surgeon: Doran Stabler, MD;  Location: [facility];  Service: Gastroenterology;  Laterality: N/A;   COLONOSCOPY  2001   normal per pt. in [place] Bilateral    for tennis elbow   SHOULDER ARTHROSCOPY  09/2005   left   SHOULDER ARTHROSCOPY  09/7024   right   UMBILICAL HERNIA REPAIR  01/2009   UVULOPALATOPHARYNGOPLASTY  08/2001   Current Outpatient Medications on File Prior to Visit  Medication Sig Dispense Refill   Accu-Chek FastClix Lancets MISC Use to check BS bid DX: E11.9  200 each 3   AMBULATORY NON FORMULARY MEDICATION Medication Name: Nitroglycerine ointment 0.125 %  Apply a pea sized amount internally three times daily. Dispense 30 GM zero refill 1 Tube 0   atorvastatin (LIPITOR) 40 MG tablet TAKE 1 TABLET DAILY 90 tablet 3   Blood Glucose Monitoring Suppl (FREESTYLE LITE) DEVI Check BS bid E11.9 200 each 3   clonazePAM (KLONOPIN) 0.5 MG tablet TAKE 1 TABLET(0.5 MG) BY MOUTH TWICE DAILY AS NEEDED FOR ANXIETY 60 tablet 0   clonazePAM (KLONOPIN) 0.5 MG tablet Take 0.5 mg by mouth in the morning and at bedtime.     Dulaglutide (TRULICITY) 1.5 VZ/8.5YI SOPN Inject 1.5 mg into the skin once a week. 12 pen 4   empagliflozin (JARDIANCE) 25 MG TABS tablet Take 25 mg by mouth daily before breakfast. 90 tablet 3   glucose blood (FREESTYLE LITE) test strip Check BS bid E11.9 200 each 3   Lancets Misc. (ACCU-CHEK FASTCLIX LANCET) KIT Use to check BS bid DX: E11.9 1 kit 1   lansoprazole (PREVACID) 30 MG capsule TAKE 1 CAPSULE DAILY BEFORE A MEAL 90 capsule 3   levothyroxine (SYNTHROID) 75 MCG tablet TAKE 1 TABLET(75 MCG) BY MOUTH DAILY 90 tablet 3   metFORMIN (GLUCOPHAGE) 500 MG tablet TAKE 2 TABLETS (1000  MG TOTAL) TWICE A DAY WITH A MEAL 360 tablet 3   Multiple Vitamin (MULTIVITAMIN) tablet Take 1 tablet by mouth daily.     SYRINGE-NEEDLE, DISP, 3 ML (LUER LOCK SAFETY SYRINGES) 23G X 1" 3 ML MISC [place] with Testosterone injection q 2 weeks 50 each 2   testosterone cypionate (DEPOTESTOSTERONE CYPIONATE) 200 MG/ML injection INJECT 1ML INTO THE MUSCLE EVERY 14 DAYS 10 mL 3   TRINTELLIX 20 MG TABS tablet TAKE 1 TABLET DAILY 90 tablet 3   UNABLE TO FIND Med Name:CPAP THERAPHY     vardenafil (LEVITRA) 20 MG tablet Take 20 mg by mouth daily as needed.       No current facility-administered medications on file prior to visit.   No Known Allergies Social History   Socioeconomic History   Marital status: Married    Spouse name: Not on file   Number of  children: 1   Years of education: Not on file   Highest education level: Not on file  Occupational History   Not on file  Tobacco Use   Smoking status: Former Smoker    Types: Cigars   Smokeless tobacco: Never Used   Tobacco comment: social  Substance and Sexual Activity   Alcohol use: Yes    Alcohol/week: 2.0 standard drinks    Types: 2 Standard drinks or equivalent per week    Comment: socail 1 drink per week per pt   Drug use: No   Sexual activity: Not on file  Other Topics Concern   Not on file  Social History Narrative   Not on file   Social Determinants of Health   Financial Resource Strain:    Difficulty of Paying Living Expenses:   Food Insecurity:    Worried About Charity fundraiser in the Last Year:    Arboriculturist in the Last Year:   Transportation Needs:    Film/video editor (Medical):    Lack of Transportation (Non-Medical):   Physical Activity:    Days of Exercise per Week:    Minutes of Exercise per Session:   Stress:    Feeling of Stress :   Social Connections:    Frequency of Communication with Friends and Family:    Frequency of Social Gatherings with Friends and Family:    Attends Religious Services:    Active Member of Clubs or Organizations:    Attends Music therapist:    Marital Status:   Intimate Partner Violence:    Fear of Current or Ex-Partner:    Emotionally Abused:    Physically Abused:    Sexually Abused:       Review of Systems  All other systems reviewed and are negative.      Objective:   Physical Exam  Constitutional: He appears well-developed and well-nourished. No distress.  Eyes: Pupils are equal, round, and reactive to light. Conjunctivae and EOM are normal.  Cardiovascular: Normal rate, regular rhythm and normal heart sounds.  No murmur heard. Pulmonary/Chest: Effort normal and breath sounds normal. No respiratory distress. He has no wheezes. He has no rales.    Musculoskeletal:     Left hand: Swelling and laceration present. Decreased sensation.       Hands:  Skin: He is not diaphoretic.  Vitals reviewed.         Assessment & Plan:  Cellulitis of left little finger - Plan: WOUND CULTURE  Sutures removed without difficulty.  Wound culture was sent of the pus emanated  from the radial side of the laceration.  There was no large fluid collection that required incision and drainage.  I will start the patient on Bactrim double strength tablets twice daily for 7 days and await wound culture.  Recheck if no better in 2 days or sooner if worsening

## 2019-07-04 LAB — WOUND CULTURE
MICRO NUMBER:: 10538342
SPECIMEN QUALITY:: ADEQUATE

## 2019-07-05 ENCOUNTER — Other Ambulatory Visit: Payer: Self-pay | Admitting: Family Medicine

## 2019-07-07 NOTE — Telephone Encounter (Signed)
Ok to refill??  Last office visit 07/01/2019.  Last refill 06/02/2019.

## 2019-07-10 ENCOUNTER — Encounter: Payer: Self-pay | Admitting: Family Medicine

## 2019-07-25 ENCOUNTER — Telehealth: Payer: Self-pay | Admitting: Cardiovascular Disease

## 2019-07-25 NOTE — Telephone Encounter (Signed)
Patient is calling to state that [facility] has nothing on file regarding the CPAP prescription that was sent on 5/19.

## 2019-07-30 ENCOUNTER — Telehealth: Payer: Self-pay | Admitting: Cardiovascular Disease

## 2019-07-30 NOTE — Telephone Encounter (Signed)
Pt called and said [facility] still does not have an RX for the patient's new CPAP machine.   The Patient has made multiple calls to [facility] and has been told every time that Dr. Claiborne Billings has not sent anything.  The patient would like a confirmation when that fax has been sent

## 2019-08-01 ENCOUNTER — Telehealth: Payer: Self-pay | Admitting: *Deleted

## 2019-08-01 NOTE — Telephone Encounter (Signed)
Returned a call and spoke with the patient informing him the order was sent via Epic on 06/18/19. I will personally reach out to account representative, Jonn Shingles to pull the order and have it processed ASAP. I apologized to the patient and recommended that if he doesn't hear back from them call office back and have them to directly send me a message. Patient voiced understanding.

## 2019-08-01 NOTE — Telephone Encounter (Signed)
Patient called to state he's called several times regarding the CPAP rx to be sent to [facility] and everytime, [facility] says we havent sent anything. Spoke with Mariann Laster who states she will look into it and then call him back, patient advised.

## 2019-08-02 ENCOUNTER — Encounter: Payer: Self-pay | Admitting: Family Medicine

## 2019-08-02 DIAGNOSIS — E1165 Type 2 diabetes mellitus with hyperglycemia: Secondary | ICD-10-CM

## 2019-08-02 DIAGNOSIS — E039 Hypothyroidism, unspecified: Secondary | ICD-10-CM

## 2019-08-02 DIAGNOSIS — E785 Hyperlipidemia, unspecified: Secondary | ICD-10-CM

## 2019-08-02 DIAGNOSIS — Z125 Encounter for screening for malignant neoplasm of prostate: Secondary | ICD-10-CM

## 2019-08-02 DIAGNOSIS — E291 Testicular hypofunction: Secondary | ICD-10-CM

## 2019-08-02 DIAGNOSIS — Z1159 Encounter for screening for other viral diseases: Secondary | ICD-10-CM

## 2019-08-06 NOTE — Telephone Encounter (Signed)
Order placed to [facility] via community message and faxed to [phone]. Patient notified

## 2019-08-07 ENCOUNTER — Other Ambulatory Visit: Payer: Self-pay | Admitting: Family Medicine

## 2019-08-07 NOTE — Telephone Encounter (Signed)
Requested Prescriptions   Pending Prescriptions Disp Refills  . clonazePAM (KLONOPIN) 0.5 MG tablet [Pharmacy Med Name: CLONAZEPAM 0.5MG  TABLETS] 60 tablet 0    Sig: TAKE 1 TABLET(0.5 MG) BY MOUTH TWICE DAILY AS NEEDED FOR ANXIETY     Last OV 07/01/2019   Last written 07/07/2019

## 2019-08-11 NOTE — Telephone Encounter (Signed)
Resent the order to [facility] and Miquel Dunn has confirmed they have received the order on 08/06/19.

## 2019-08-11 NOTE — Telephone Encounter (Signed)
Resent the order to [facility] and Miquel Dunn has confirmed they have received the order on 08/06/19.

## 2019-08-13 ENCOUNTER — Other Ambulatory Visit

## 2019-08-13 ENCOUNTER — Other Ambulatory Visit: Payer: Self-pay

## 2019-08-13 DIAGNOSIS — E785 Hyperlipidemia, unspecified: Secondary | ICD-10-CM

## 2019-08-13 DIAGNOSIS — E039 Hypothyroidism, unspecified: Secondary | ICD-10-CM

## 2019-08-13 DIAGNOSIS — E291 Testicular hypofunction: Secondary | ICD-10-CM

## 2019-08-13 DIAGNOSIS — E1165 Type 2 diabetes mellitus with hyperglycemia: Secondary | ICD-10-CM

## 2019-08-13 DIAGNOSIS — Z125 Encounter for screening for malignant neoplasm of prostate: Secondary | ICD-10-CM

## 2019-08-13 DIAGNOSIS — Z1159 Encounter for screening for other viral diseases: Secondary | ICD-10-CM

## 2019-08-14 ENCOUNTER — Encounter: Payer: Self-pay | Admitting: Family Medicine

## 2019-08-14 LAB — COMPLETE METABOLIC PANEL WITH GFR
AG Ratio: 1.9 (calc) (ref 1.0–2.5)
ALT: 19 U/L (ref 9–46)
AST: 11 U/L (ref 10–35)
Albumin: 4.4 g/dL (ref 3.6–5.1)
Alkaline phosphatase (APISO): 68 U/L (ref 35–144)
BUN: 16 mg/dL (ref 7–25)
CO2: 24 mmol/L (ref 20–32)
Calcium: 9.2 mg/dL (ref 8.6–10.3)
Chloride: 104 mmol/L (ref 98–110)
Creat: 0.76 mg/dL (ref 0.70–1.33)
GFR, Est African American: 120 mL/min/{1.73_m2} (ref >=60)
GFR, Est Non African American: 103 mL/min/{1.73_m2} (ref >=60)
Globulin: 2.3 g/dL (calc) (ref 1.9–3.7)
Glucose, Bld: 114 mg/dL — ABNORMAL HIGH (ref 65–99)
Potassium: 4.5 mmol/L (ref 3.5–5.3)
Sodium: 138 mmol/L (ref 135–146)
Total Bilirubin: 0.4 mg/dL (ref 0.2–1.2)
Total Protein: 6.7 g/dL (ref 6.1–8.1)

## 2019-08-14 LAB — HEPATITIS C ANTIBODY
Hepatitis C Ab: NONREACTIVE
SIGNAL TO CUT-OFF: 0.01 (ref <1.00)

## 2019-08-14 LAB — CBC WITH DIFFERENTIAL/PLATELET
Absolute Monocytes: 583 cells/uL (ref 200–950)
Basophils Absolute: 93 cells/uL (ref 0–200)
Basophils Relative: 1.5 %
Eosinophils Absolute: 143 cells/uL (ref 15–500)
Eosinophils Relative: 2.3 %
HCT: 48.2 % (ref 38.5–50.0)
Hemoglobin: 16.1 g/dL (ref 13.2–17.1)
Lymphs Abs: 2127 cells/uL (ref 850–3900)
MCH: 31.1 pg (ref 27.0–33.0)
MCHC: 33.4 g/dL (ref 32.0–36.0)
MCV: 93.2 fL (ref 80.0–100.0)
MPV: 9.9 fL (ref 7.5–12.5)
Monocytes Relative: 9.4 %
Neutro Abs: 3255 cells/uL (ref 1500–7800)
Neutrophils Relative %: 52.5 %
Platelets: 270 10*3/uL (ref 140–400)
RBC: 5.17 10*6/uL (ref 4.20–5.80)
RDW: 11.9 % (ref 11.0–15.0)
Total Lymphocyte: 34.3 %
WBC: 6.2 10*3/uL (ref 3.8–10.8)

## 2019-08-14 LAB — HEMOGLOBIN A1C
Hgb A1c MFr Bld: 6.4 % of total Hgb — ABNORMAL HIGH (ref <5.7)
Mean Plasma Glucose: 137 (calc)
eAG (mmol/L): 7.6 (calc)

## 2019-08-14 LAB — MICROALBUMIN / CREATININE URINE RATIO
Creatinine, Urine: 63 mg/dL (ref 20–320)
Microalb Creat Ratio: 6 mcg/mg creat (ref <30)
Microalb, Ur: 0.4 mg/dL

## 2019-08-14 LAB — LIPID PANEL
Cholesterol: 137 mg/dL (ref <200)
HDL: 43 mg/dL (ref >=40)
LDL Cholesterol (Calc): 80 mg/dL (calc)
Non-HDL Cholesterol (Calc): 94 mg/dL (calc) (ref <130)
Total CHOL/HDL Ratio: 3.2 (calc) (ref <5.0)
Triglycerides: 67 mg/dL (ref <150)

## 2019-08-14 LAB — PSA: PSA: 0.4 ng/mL (ref <=4.0)

## 2019-08-14 LAB — TSH: TSH: 2.99 mIU/L (ref 0.40–4.50)

## 2019-08-14 LAB — TESTOSTERONE: Testosterone: 685 ng/dL (ref 250–827)

## 2019-08-22 ENCOUNTER — Ambulatory Visit (INDEPENDENT_AMBULATORY_CARE_PROVIDER_SITE_OTHER): Admitting: Family Medicine

## 2019-08-22 ENCOUNTER — Other Ambulatory Visit: Payer: Self-pay

## 2019-08-22 VITALS — BP 108/60 | HR 63 | Temp 97.1°F | Ht 71.0 in | Wt 189.0 lb

## 2019-08-22 DIAGNOSIS — Z125 Encounter for screening for malignant neoplasm of prostate: Secondary | ICD-10-CM | POA: Diagnosis not present

## 2019-08-22 DIAGNOSIS — E291 Testicular hypofunction: Secondary | ICD-10-CM

## 2019-08-22 DIAGNOSIS — E119 Type 2 diabetes mellitus without complications: Secondary | ICD-10-CM

## 2019-08-22 DIAGNOSIS — Z0000 Encounter for general adult medical examination without abnormal findings: Secondary | ICD-10-CM

## 2019-08-22 DIAGNOSIS — E039 Hypothyroidism, unspecified: Secondary | ICD-10-CM | POA: Diagnosis not present

## 2019-08-22 DIAGNOSIS — Z0001 Encounter for general adult medical examination with abnormal findings: Secondary | ICD-10-CM

## 2019-08-22 DIAGNOSIS — E785 Hyperlipidemia, unspecified: Secondary | ICD-10-CM

## 2019-08-22 NOTE — Progress Notes (Signed)
Subjective:    Patient ID: Mike Henderson, male    DOB: 07/24/1965, 54 y.o.   MRN: [id number]   Patient is here today for complete physical exam.  His most recent lab work is listed below: Appointment on 08/13/2019  Component Date Value Ref Range Status  . Hepatitis C Ab 08/13/2019 NON-REACTIVE  NON-REACTI Final  . SIGNAL TO CUT-OFF 08/13/2019 0.01  <1.00 Final   Comment: . HCV antibody was non-reactive. There is no laboratory  evidence of HCV infection. . In most cases, no further action is required. However, if recent HCV exposure is suspected, a test for HCV RNA (test code [phone]) is suggested. . For additional information please refer to http://education.[facility].com/faq/FAQ22v1 (This link is being provided for informational/ educational purposes only.) .   Marland Kitchen PSA 08/13/2019 0.4  < OR = 4.0 ng/mL Final   Comment: The total PSA value from this assay system is  standardized against the WHO standard. The test  result will be approximately 20% lower when compared  to the equimolar-standardized total PSA (Beckman  Coulter). Comparison of serial PSA results should be  interpreted with this fact in mind. . This test was performed using the Siemens  chemiluminescent method. Values obtained from  different assay methods cannot be used interchangeably. PSA levels, regardless of value, should not be interpreted as absolute evidence of the presence or absence of disease.   . Creatinine, Urine 08/13/2019 63  20 - 320 mg/dL Final  . Microalb, Ur 08/13/2019 0.4  mg/dL Final   Comment: Reference Range Not established   . Microalb Creat Ratio 08/13/2019 6  <30 mcg/mg creat Final   Comment: . The ADA defines abnormalities in albumin excretion as follows: Marland Kitchen Category         Result (mcg/mg creatinine) . Normal                    <30 Microalbuminuria         30-299  Clinical albuminuria   > OR = 300 . The ADA recommends that at least two of three specimens collected  within a 3-6 month period be abnormal before considering a patient to be within a diagnostic category.   . Testosterone 08/13/2019 685  250 - 827 ng/dL Final  . TSH 08/13/2019 2.99  0.40 - 4.50 mIU/L Final  . Hgb A1c MFr Bld 08/13/2019 6.4* <5.7 % of total Hgb Final   Comment: For someone without known diabetes, a hemoglobin  A1c value between 5.7% and 6.4% is consistent with prediabetes and should be confirmed with a  follow-up test. . For someone with known diabetes, a value <7% indicates that their diabetes is well controlled. A1c targets should be individualized based on duration of diabetes, age, comorbid conditions, and other considerations. . This assay result is consistent with an increased risk of diabetes. . Currently, no consensus exists regarding use of hemoglobin A1c for diagnosis of diabetes for children. .   . Mean Plasma Glucose 08/13/2019 137  (calc) Final  . eAG (mmol/L) 08/13/2019 7.6  (calc) Final  . Cholesterol 08/13/2019 137  <200 mg/dL Final  . HDL 08/13/2019 43  > OR = 40 mg/dL Final  . Triglycerides 08/13/2019 67  <150 mg/dL Final  . LDL Cholesterol (Calc) 08/13/2019 80  mg/dL (calc) Final   Comment: Reference range: <100 . Desirable range <100 mg/dL for primary prevention;   <70 mg/dL for patients with CHD or diabetic patients  with > or = 2 CHD risk  factors. Marland Kitchen LDL-C is now calculated using the Martin-Hopkins  calculation, which is a validated novel method providing  better accuracy than the Friedewald equation in the  estimation of LDL-C.  Cresenciano Genre et al. Annamaria Helling. 1031;594(58): 2061-2068  (http://education.[facility].com/faq/FAQ164)   . Total CHOL/HDL Ratio 08/13/2019 3.2  <5.0 (calc) Final  . Non-HDL Cholesterol (Calc) 08/13/2019 94  <130 mg/dL (calc) Final   Comment: For patients with diabetes plus 1 major ASCVD risk  factor, treating to a non-HDL-C goal of <100 mg/dL  (LDL-C of <70 mg/dL) is considered a therapeutic  option.   .  Glucose, Bld 08/13/2019 114* 65 - 99 mg/dL Final   Comment: .            Fasting reference interval . For someone without known diabetes, a glucose value between 100 and 125 mg/dL is consistent with prediabetes and should be confirmed with a follow-up test. .   . BUN 08/13/2019 16  7 - 25 mg/dL Final  . Creat 08/13/2019 0.76  0.70 - 1.33 mg/dL Final   Comment: For patients >110 years of age, the reference limit for Creatinine is approximately 13% higher for people identified as African-American. .   . GFR, Est Non African American 08/13/2019 103  > OR = 60 mL/min/1.61m Final  . GFR, Est African American 08/13/2019 120  > OR = 60 mL/min/1.720mFinal  . BUN/Creatinine Ratio 0759/29/2446OT APPLICABLE  6 - 22 (calc) Final  . Sodium 08/13/2019 138  135 - 146 mmol/L Final  . Potassium 08/13/2019 4.5  3.5 - 5.3 mmol/L Final  . Chloride 08/13/2019 104  98 - 110 mmol/L Final  . CO2 08/13/2019 24  20 - 32 mmol/L Final  . Calcium 08/13/2019 9.2  8.6 - 10.3 mg/dL Final  . Total Protein 08/13/2019 6.7  6.1 - 8.1 g/dL Final  . Albumin 08/13/2019 4.4  3.6 - 5.1 g/dL Final  . Globulin 08/13/2019 2.3  1.9 - 3.7 g/dL (calc) Final  . AG Ratio 08/13/2019 1.9  1.0 - 2.5 (calc) Final  . Total Bilirubin 08/13/2019 0.4  0.2 - 1.2 mg/dL Final  . Alkaline phosphatase (APISO) 08/13/2019 68  35 - 144 U/L Final  . AST 08/13/2019 11  10 - 35 U/L Final  . ALT 08/13/2019 19  9 - 46 U/L Final  . WBC 08/13/2019 6.2  3.8 - 10.8 Thousand/uL Final  . RBC 08/13/2019 5.17  4.20 - 5.80 Million/uL Final  . Hemoglobin 08/13/2019 16.1  13.2 - 17.1 g/dL Final  . HCT 08/13/2019 48.2  38 - 50 % Final  . MCV 08/13/2019 93.2  80.0 - 100.0 fL Final  . MCH 08/13/2019 31.1  27.0 - 33.0 pg Final  . MCHC 08/13/2019 33.4  32.0 - 36.0 g/dL Final  . RDW 08/13/2019 11.9  11.0 - 15.0 % Final  . Platelets 08/13/2019 270  140 - 400 Thousand/uL Final  . MPV 08/13/2019 9.9  7.5 - 12.5 fL Final  . Neutro Abs 08/13/2019 3,255  1,500 -  7,800 cells/uL Final  . Lymphs Abs 08/13/2019 2,127  850 - 3,900 cells/uL Final  . Absolute Monocytes 08/13/2019 583  200 - 950 cells/uL Final  . Eosinophils Absolute 08/13/2019 143  15 - 500 cells/uL Final  . Basophils Absolute 08/13/2019 93  0 - 200 cells/uL Final  . Neutrophils Relative % 08/13/2019 52.5  % Final  . Total Lymphocyte 08/13/2019 34.3  % Final  . Monocytes Relative 08/13/2019 9.4  % Final  . Eosinophils Relative  08/13/2019 2.3  % Final  . Basophils Relative 08/13/2019 1.5  % Final   PSA is normal showing no indication for prostate cancer.  Hemoglobin A1c has dropped dramatically to 6.4.  LDL cholesterol is well below 100.  TSH is normal.  Testosterone is greater than 600.  This is despite the fact he is not administered a testosterone shot in more than 2 months.  The patient has drastically changed his diet and has lost weight.  He also recently had a colonoscopy in January which was normal.  They have not recommended a repeat colonoscopy for 10 years.  He does have rectal pain due to abnormal sphincter relaxation proven on anal manometry.  Otherwise his work-up was unremarkable. Past Medical History:  Diagnosis Date  . Anxiety    Panic attacks  . Arthritis   . Blood transfusion without reported diagnosis   . Chronic prostatitis   . Constipation   . Depression   . Diabetes mellitus without complication ([place])   . ED (erectile dysfunction)   . Epididymitis   . GERD (gastroesophageal reflux disease)   . Hyperlipidemia   . Migraines   . OSA (obstructive sleep apnea) 02/01/12   SLEEP STUDY [facility]  . RBBB   . Sleep apnea    uses Cpap  . Thyroid disease    hypothyroid   Past Surgical History:  Procedure Laterality Date  . ANAL RECTAL MANOMETRY N/A 03/14/2019   Procedure: ANO RECTAL MANOMETRY;  Surgeon: Doran Stabler, MD;  Location: [facility];  Service: Gastroenterology;  Laterality: N/A;  . COLONOSCOPY  2001   normal per pt. in  [employer]  . ELBOW SURGERY Bilateral    for tennis elbow  . SHOULDER ARTHROSCOPY  09/2005   left  . SHOULDER ARTHROSCOPY  01/2006   right  . UMBILICAL HERNIA REPAIR  01/2009  . UVULOPALATOPHARYNGOPLASTY  08/2001   Current Outpatient Medications on File Prior to Visit  Medication Sig Dispense Refill  . AMBULATORY NON FORMULARY MEDICATION Medication Name: Nitroglycerine ointment 0.125 %  Apply a pea sized amount internally three times daily. Dispense 30 GM zero refill 1 Tube 0  . atorvastatin (LIPITOR) 40 MG tablet TAKE 1 TABLET DAILY 90 tablet 3  . Blood Glucose Monitoring Suppl (FREESTYLE LITE) DEVI Check BS bid E11.9 200 each 3  . clonazePAM (KLONOPIN) 0.5 MG tablet TAKE 1 TABLET(0.5 MG) BY MOUTH TWICE DAILY AS NEEDED FOR ANXIETY 60 tablet 0  . Dulaglutide (TRULICITY) 1.5 LP/3.7TK SOPN Inject 1.5 mg into the skin once a week. 12 pen 4  . empagliflozin (JARDIANCE) 25 MG TABS tablet Take 25 mg by mouth daily before breakfast. 90 tablet 3  . glucose blood (FREESTYLE LITE) test strip Check BS bid E11.9 200 each 3  . Lancets (FREESTYLE) lancets USE TO CHECK BLOOD SUGAR TWICE DAILY 100 each 6  . Lancets Misc. (ACCU-CHEK FASTCLIX LANCET) KIT Use to check BS bid DX: E11.9 1 kit 1  . lansoprazole (PREVACID) 30 MG capsule TAKE 1 CAPSULE DAILY BEFORE A MEAL 90 capsule 3  . levothyroxine (SYNTHROID) 75 MCG tablet TAKE 1 TABLET(75 MCG) BY MOUTH DAILY 90 tablet 3  . metFORMIN (GLUCOPHAGE) 500 MG tablet TAKE 2 TABLETS (1000 MG TOTAL) TWICE A DAY WITH A MEAL 360 tablet 3  . Multiple Vitamin (MULTIVITAMIN) tablet Take 1 tablet by mouth daily.    Marland Kitchen sulfamethoxazole-trimethoprim (BACTRIM DS) 800-160 MG tablet Take 1 tablet by mouth 2 (two) times daily. 14 tablet 0  .  SYRINGE-NEEDLE, DISP, 3 ML (LUER LOCK SAFETY SYRINGES) 23G X 1" 3 ML MISC [place] with Testosterone injection q 2 weeks 50 each 2  . testosterone cypionate (DEPOTESTOSTERONE CYPIONATE) 200 MG/ML injection INJECT 1ML INTO THE MUSCLE EVERY 14 DAYS 10 mL  3  . TRINTELLIX 20 MG TABS tablet TAKE 1 TABLET DAILY 90 tablet 3  . UNABLE TO FIND Med Name:CPAP THERAPHY    . vardenafil (LEVITRA) 20 MG tablet Take 20 mg by mouth daily as needed.       No current facility-administered medications on file prior to visit.   No Known Allergies Social History   Socioeconomic History  . Marital status: Married    Spouse name: Not on file  . Number of children: 1  . Years of education: Not on file  . Highest education level: Not on file  Occupational History  . Not on file  Tobacco Use  . Smoking status: Former Smoker    Types: Cigars  . Smokeless tobacco: Never Used  . Tobacco comment: social  Vaping Use  . Vaping Use: Never used  Substance and Sexual Activity  . Alcohol use: Yes    Alcohol/week: 2.0 standard drinks    Types: 2 Standard drinks or equivalent per week    Comment: socail 1 drink per week per pt  . Drug use: No  . Sexual activity: Not on file  Other Topics Concern  . Not on file  Social History Narrative  . Not on file   Social Determinants of Health   Financial Resource Strain:   . Difficulty of Paying Living Expenses:   Food Insecurity:   . Worried About Charity fundraiser in the Last Year:   . Arboriculturist in the Last Year:   Transportation Needs:   . Film/video editor (Medical):   Marland Kitchen Lack of Transportation (Non-Medical):   Physical Activity:   . Days of Exercise per Week:   . Minutes of Exercise per Session:   Stress:   . Feeling of Stress :   Social Connections:   . Frequency of Communication with Friends and Family:   . Frequency of Social Gatherings with Friends and Family:   . Attends Religious Services:   . Active Member of Clubs or Organizations:   . Attends Archivist Meetings:   Marland Kitchen Marital Status:   Intimate Partner Violence:   . Fear of Current or Ex-Partner:   . Emotionally Abused:   Marland Kitchen Physically Abused:   . Sexually Abused:       Review of Systems  All other systems  reviewed and are negative.      Objective:   Physical Exam Vitals reviewed.  Constitutional:      General: He is not in acute distress.    Appearance: Normal appearance. He is well-developed and normal weight. He is not ill-appearing, toxic-appearing or diaphoretic.  HENT:     Head: Normocephalic and atraumatic.     Right Ear: Tympanic membrane, ear canal and external ear normal. There is no impacted cerumen.     Left Ear: Tympanic membrane, ear canal and external ear normal. There is no impacted cerumen.     Nose: Nose normal. No congestion or rhinorrhea.     Mouth/Throat:     Mouth: Mucous membranes are moist.     Pharynx: No oropharyngeal exudate or posterior oropharyngeal erythema.  Eyes:     General: No scleral icterus.       Right eye: No discharge.  Left eye: No discharge.     Extraocular Movements: Extraocular movements intact.     Conjunctiva/sclera: Conjunctivae normal.     Pupils: Pupils are equal, round, and reactive to light.  Neck:     Vascular: No carotid bruit or JVD.  Cardiovascular:     Rate and Rhythm: Normal rate and regular rhythm.     Heart sounds: Normal heart sounds. No murmur heard.  No friction rub. No gallop.   Pulmonary:     Effort: Pulmonary effort is normal. No respiratory distress.     Breath sounds: Normal breath sounds. No stridor. No wheezing, rhonchi or rales.  Chest:     Chest wall: No tenderness.  Abdominal:     General: Abdomen is flat. Bowel sounds are normal. There is no distension.     Palpations: Abdomen is soft. There is no mass.     Tenderness: There is no abdominal tenderness. There is no right CVA tenderness, left CVA tenderness, guarding or rebound.     Hernia: No hernia is present.  Musculoskeletal:        General: Normal range of motion.     Left shoulder: Tenderness present. Normal strength.     Cervical back: Normal range of motion and neck supple. No rigidity or tenderness.     Right lower leg: No edema.     Left  lower leg: No edema.  Lymphadenopathy:     Cervical: No cervical adenopathy.  Skin:    General: Skin is warm.     Coloration: Skin is not jaundiced or pale.     Findings: No bruising, erythema, lesion or rash.  Neurological:     General: No focal deficit present.     Mental Status: He is alert and oriented to person, place, and time. Mental status is at baseline.     Cranial Nerves: No cranial nerve deficit.     Sensory: No sensory deficit.     Motor: No weakness.     Coordination: Coordination normal.     Gait: Gait normal.     Deep Tendon Reflexes: Reflexes normal.  Psychiatric:        Mood and Affect: Mood normal.        Behavior: Behavior normal.        Thought Content: Thought content normal.        Judgment: Judgment normal.           Assessment & Plan:  General medical exam  Hypothyroidism, unspecified type  Hypogonadism in male  Prostate cancer screening  Hyperlipidemia, unspecified hyperlipidemia type  Controlled type 2 diabetes mellitus without complication, without long-term current use of insulin ([place])  I am very proud of this patient.  His lab work is outstanding.  He politely declines an HIV test.  He had an eye exam in November and he will sign a release of information form so that we can get this.  His immunizations including his Covid vaccination are up-to-date.  Diabetic foot exam is normal.  A1c is outstanding.  LDL cholesterol is well below 100.  TSH is normal.  PSA is normal.  Testosterone level is normal.  Therefore I will make no changes at the present time.  I encouraged the patient to continue with the lifestyle changes he is implemented which seem to be helping dramatically.  Recheck in 6 months

## 2019-09-08 ENCOUNTER — Other Ambulatory Visit: Payer: Self-pay | Admitting: Family Medicine

## 2019-09-10 ENCOUNTER — Other Ambulatory Visit: Payer: Self-pay | Admitting: Family Medicine

## 2019-09-10 NOTE — Telephone Encounter (Signed)
Ok to refill??  Last office visit 08/22/2019.  Last refill 08/07/2019.  Ok to add refills to prescription?

## 2019-09-22 ENCOUNTER — Ambulatory Visit: Admitting: Cardiovascular Disease

## 2019-09-24 ENCOUNTER — Encounter: Payer: Self-pay | Admitting: Family Medicine

## 2019-10-22 ENCOUNTER — Encounter: Payer: Self-pay | Admitting: Family Medicine

## 2019-10-23 IMAGING — CR DG LUMBAR SPINE COMPLETE 4+V
5 series · 5 of 5 positions shown · non-contrast
Comparison: None in PACs

CLINICAL DATA: Right lower back pain for the past 3 months. No
history of injury. History of diabetes.

EXAM:
LUMBAR SPINE - COMPLETE 4+ VIEW

[t l-spine a.p.]
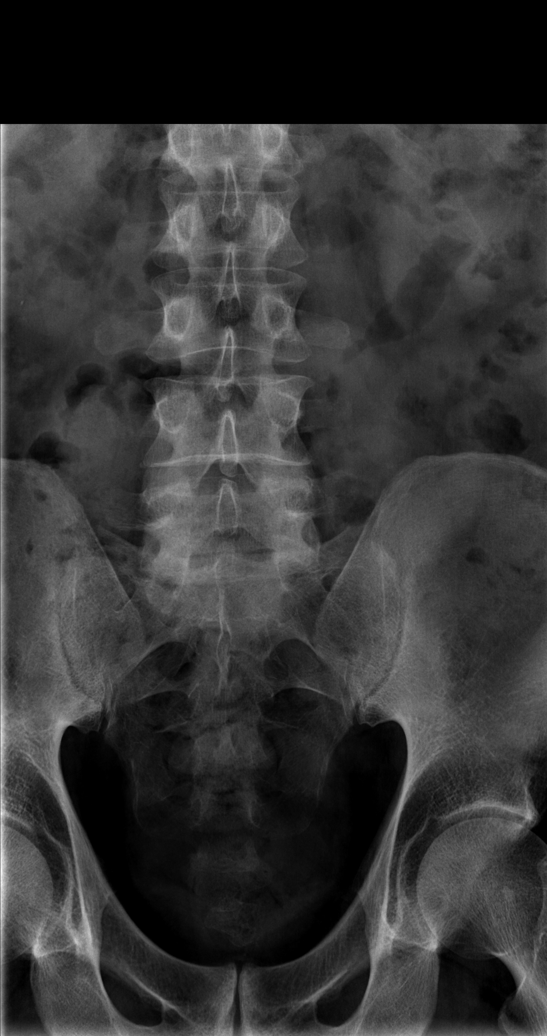

[t l-spine oblique exposure (1 of 2)]
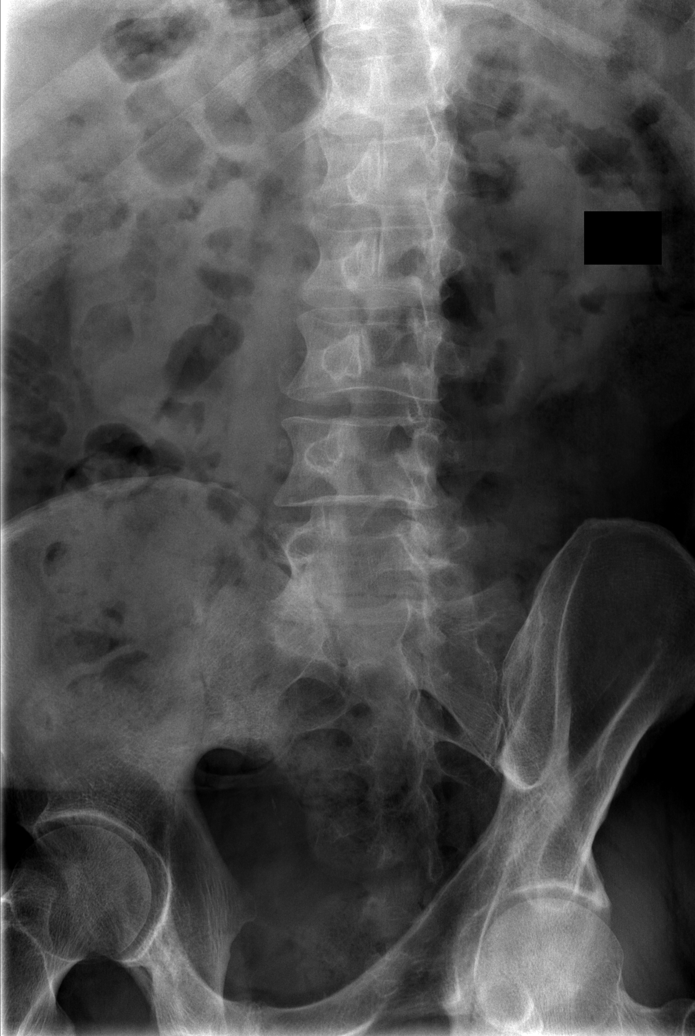

[t l-spine oblique exposure (2 of 2)]
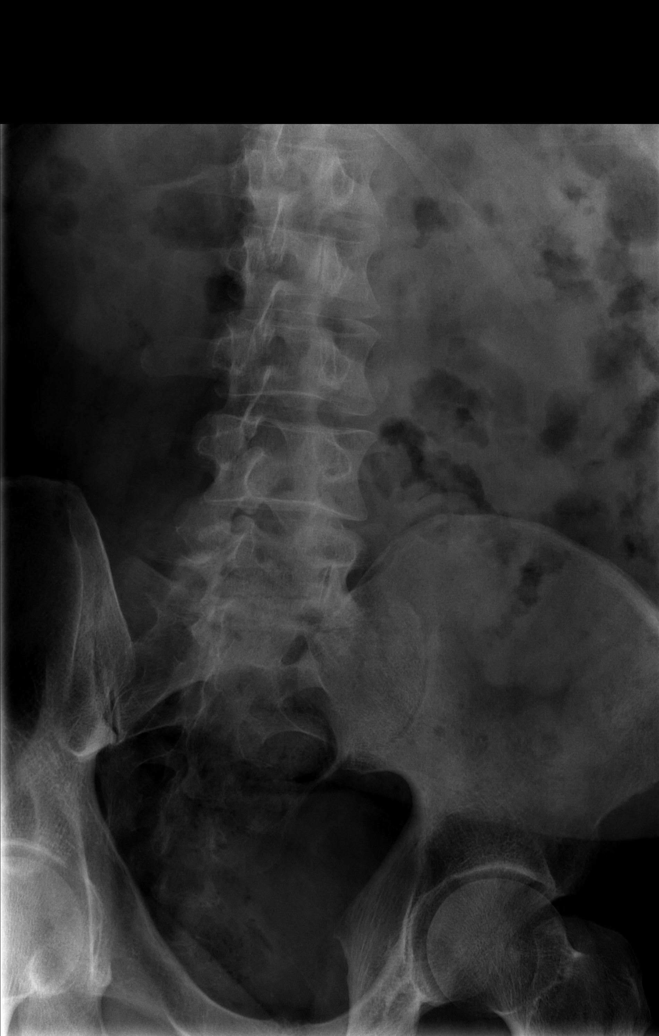

[t l-spine lat]
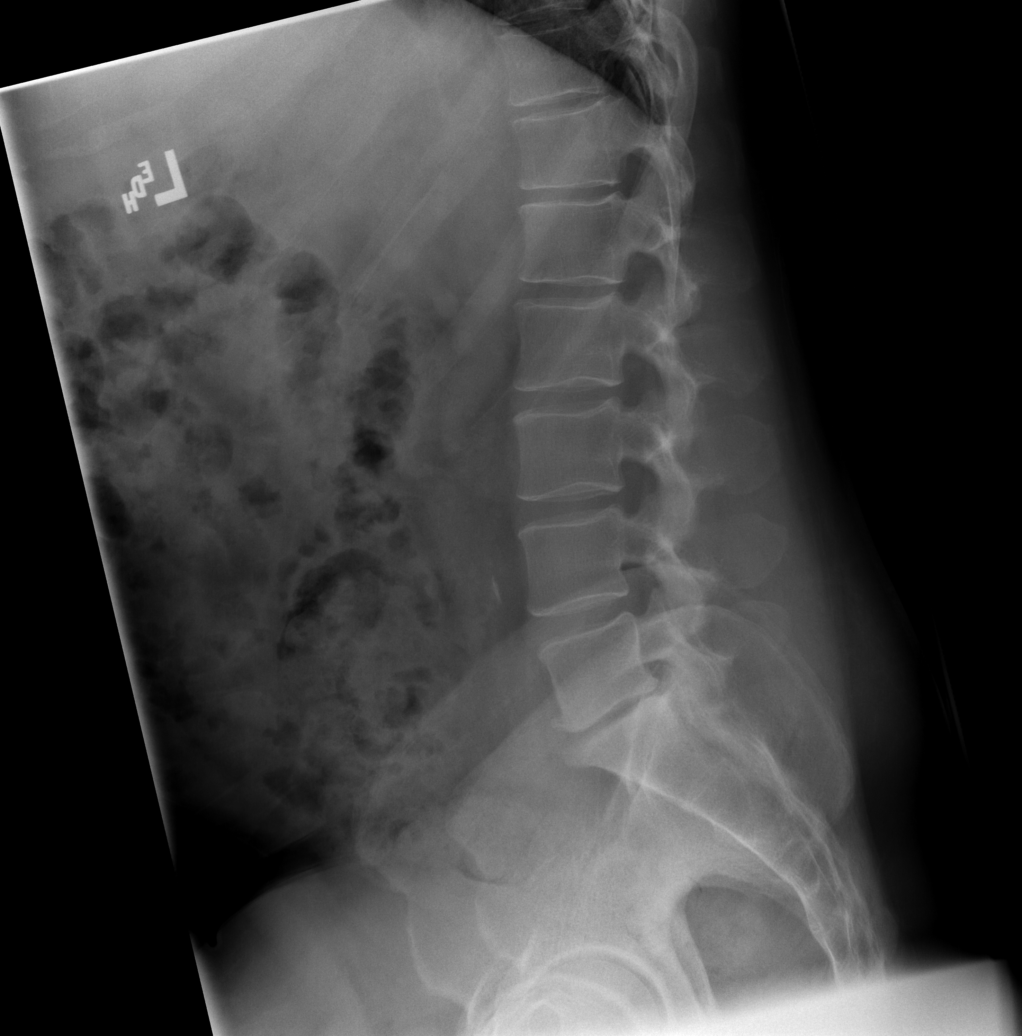

[t l-spine l5-s1 spot]
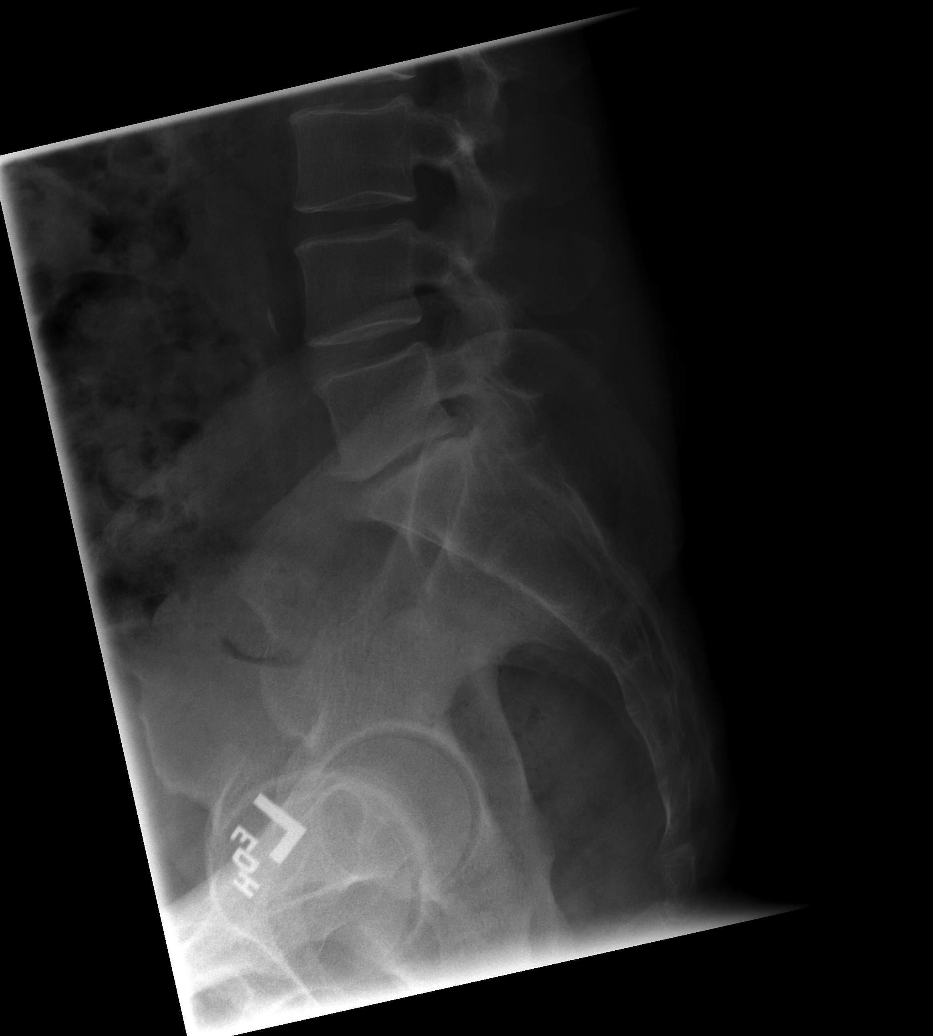

[5 of 5 positions shown; findings below may reference images not displayed]

FINDINGS: The lumbar vertebral bodies are preserved in height. The disc space
heights are well maintained with exception of L5-S1 where there is
moderate narrowing. There is no spondylolisthesis. There is no
significant facet joint hypertrophy. The pedicles and transverse
processes are intact. The observed portions of the sacrum are
normal.
IMPRESSION: Mild degenerative disc space narrowing at L5-S1. Otherwise no
significant abnormality of the lumbar spine.

## 2019-12-04 ENCOUNTER — Ambulatory Visit: Admitting: Cardiovascular Disease

## 2020-01-15 ENCOUNTER — Ambulatory Visit: Admitting: Family Medicine

## 2020-01-15 ENCOUNTER — Other Ambulatory Visit: Payer: Self-pay

## 2020-01-15 VITALS — BP 120/80 | HR 68 | Temp 98.2°F | Ht 71.0 in | Wt 192.0 lb

## 2020-01-15 DIAGNOSIS — K5902 Outlet dysfunction constipation: Secondary | ICD-10-CM

## 2020-01-15 NOTE — Progress Notes (Signed)
Subjective:    Patient ID: Mike Henderson, male    DOB: 10-27-1965, 54 y.o.   MRN: [id number]  Around Thanksgiving, the patient had a severe stomach virus and had copious diarrhea.  After Thanksgiving however he has been constipated.  He is going days without having a bowel movement.  The patient has a history of chronic constipation.  He also has pelvic floor muscle dysfunction.  He has spasms in his anal sphincter that prevents him from defecating at times and causes severe perirectal pain.  This was previously incorrectly diagnosis chronic prostatitis.  He has now seen GI and underwent a colonoscopy as well as anal manometry.  He is undergoing pelvic floor muscle relaxation techniques which helped dramatically.  However he admits that he has been somewhat slack and using his nitroglycerin daily to help relax his anal sphincter.  This could be contributing to his constipation.  He also reports the stool is extremely hard which could be contributing to some of his stool retention by triggering spasms in his anal sphincter Past Medical History:  Diagnosis Date  . Anxiety    Panic attacks  . Arthritis   . Blood transfusion without reported diagnosis   . Chronic prostatitis   . Constipation   . Depression   . Diabetes mellitus without complication ([place])   . ED (erectile dysfunction)   . Epididymitis   . GERD (gastroesophageal reflux disease)   . Hyperlipidemia   . Migraines   . OSA (obstructive sleep apnea) 02/01/12   SLEEP STUDY [facility]  . RBBB   . Sleep apnea    uses Cpap  . Thyroid disease    hypothyroid   Past Surgical History:  Procedure Laterality Date  . ANAL RECTAL MANOMETRY N/A 03/14/2019   Procedure: ANO RECTAL MANOMETRY;  Surgeon: Doran Stabler, MD;  Location: [facility];  Service: Gastroenterology;  Laterality: N/A;  . COLONOSCOPY  2001   normal per pt. in [employer]  . ELBOW SURGERY Bilateral    for tennis elbow  . SHOULDER ARTHROSCOPY   09/2005   left  . SHOULDER ARTHROSCOPY  01/2006   right  . UMBILICAL HERNIA REPAIR  01/2009  . UVULOPALATOPHARYNGOPLASTY  08/2001   Current Outpatient Medications on File Prior to Visit  Medication Sig Dispense Refill  . AMBULATORY NON FORMULARY MEDICATION Medication Name: Nitroglycerine ointment 0.125 %  Apply a pea sized amount internally three times daily. Dispense 30 GM zero refill 1 Tube 0  . atorvastatin (LIPITOR) 40 MG tablet TAKE 1 TABLET DAILY 90 tablet 3  . Blood Glucose Monitoring Suppl (FREESTYLE LITE) DEVI Check BS bid E11.9 200 each 3  . clonazePAM (KLONOPIN) 0.5 MG tablet TAKE 1 TABLET(0.5 MG) BY MOUTH TWICE DAILY AS NEEDED FOR ANXIETY 60 tablet 3  . Dulaglutide (TRULICITY) 1.5 QX/4.5WT SOPN Inject 1.5 mg into the skin once a week. 12 pen 4  . empagliflozin (JARDIANCE) 25 MG TABS tablet Take 25 mg by mouth daily before breakfast. 90 tablet 3  . glucose blood (FREESTYLE LITE) test strip Check BS bid E11.9 200 each 3  . Lancets (FREESTYLE) lancets USE TO CHECK BLOOD SUGAR TWICE DAILY 100 each 6  . Lancets Misc. (ACCU-CHEK FASTCLIX LANCET) KIT Use to check BS bid DX: E11.9 1 kit 1  . lansoprazole (PREVACID) 30 MG capsule TAKE 1 CAPSULE DAILY BEFORE A MEAL 90 capsule 2  . levothyroxine (SYNTHROID) 75 MCG tablet TAKE 1 TABLET(75 MCG) BY MOUTH DAILY 90 tablet 3  .  metFORMIN (GLUCOPHAGE) 500 MG tablet TAKE 2 TABLETS (1000 MG TOTAL) TWICE A DAY WITH A MEAL 360 tablet 3  . Multiple Vitamin (MULTIVITAMIN) tablet Take 1 tablet by mouth daily.    Marland Kitchen sulfamethoxazole-trimethoprim (BACTRIM DS) 800-160 MG tablet Take 1 tablet by mouth 2 (two) times daily. 14 tablet 0  . SYRINGE-NEEDLE, DISP, 3 ML (LUER LOCK SAFETY SYRINGES) 23G X 1" 3 ML MISC [place] with Testosterone injection q 2 weeks 50 each 2  . testosterone cypionate (DEPOTESTOSTERONE CYPIONATE) 200 MG/ML injection INJECT 1ML INTO THE MUSCLE EVERY 14 DAYS 10 mL 3  . TRINTELLIX 20 MG TABS tablet TAKE 1 TABLET DAILY 90 tablet 3  . UNABLE TO  FIND Med Name:CPAP THERAPHY    . vardenafil (LEVITRA) 20 MG tablet Take 20 mg by mouth daily as needed.       No current facility-administered medications on file prior to visit.   No Known Allergies Social History   Socioeconomic History  . Marital status: Married    Spouse name: Not on file  . Number of children: 1  . Years of education: Not on file  . Highest education level: Not on file  Occupational History  . Not on file  Tobacco Use  . Smoking status: Former Smoker    Types: Cigars  . Smokeless tobacco: Never Used  . Tobacco comment: social  Vaping Use  . Vaping Use: Never used  Substance and Sexual Activity  . Alcohol use: Yes    Alcohol/week: 2.0 standard drinks    Types: 2 Standard drinks or equivalent per week    Comment: socail 1 drink per week per pt  . Drug use: No  . Sexual activity: Not on file  Other Topics Concern  . Not on file  Social History Narrative  . Not on file   Social Determinants of Health   Financial Resource Strain: Not on file  Food Insecurity: Not on file  Transportation Needs: Not on file  Physical Activity: Not on file  Stress: Not on file  Social Connections: Not on file  Intimate Partner Violence: Not on file      Review of Systems  All other systems reviewed and are negative.      Objective:   Physical Exam Vitals reviewed.  Constitutional:      General: He is not in acute distress.    Appearance: He is well-developed. He is not diaphoretic.  Cardiovascular:     Rate and Rhythm: Normal rate and regular rhythm.     Heart sounds: Normal heart sounds. No murmur heard.   Pulmonary:     Effort: Pulmonary effort is normal.     Breath sounds: Normal breath sounds.           Assessment & Plan:  Constipation due to outlet dysfunction  I have recommended that he resume his once daily perirectal nitroglycerin application to relax his anal sphincter.  Continue to perform his pelvic floor muscle relaxation  techniques.  Meanwhile add MiraLAX daily to help soften his stool so that it can exit without pain.  He can use the MiraLAX up to twice a day if necessary.  Reassess in 2 weeks if no better or sooner if worsening

## 2020-01-22 ENCOUNTER — Other Ambulatory Visit: Payer: Self-pay | Admitting: Family Medicine

## 2020-01-22 DIAGNOSIS — F419 Anxiety disorder, unspecified: Secondary | ICD-10-CM

## 2020-02-23 ENCOUNTER — Other Ambulatory Visit: Payer: Self-pay | Admitting: Family Medicine

## 2020-02-23 ENCOUNTER — Other Ambulatory Visit: Payer: Self-pay

## 2020-02-23 ENCOUNTER — Ambulatory Visit (INDEPENDENT_AMBULATORY_CARE_PROVIDER_SITE_OTHER): Admitting: Cardiovascular Disease

## 2020-02-23 VITALS — BP 115/75 | HR 62 | Ht 71.0 in | Wt 196.0 lb

## 2020-02-23 DIAGNOSIS — G4733 Obstructive sleep apnea (adult) (pediatric): Secondary | ICD-10-CM | POA: Diagnosis not present

## 2020-02-23 DIAGNOSIS — E039 Hypothyroidism, unspecified: Secondary | ICD-10-CM | POA: Diagnosis not present

## 2020-02-23 DIAGNOSIS — E119 Type 2 diabetes mellitus without complications: Secondary | ICD-10-CM | POA: Diagnosis not present

## 2020-02-23 DIAGNOSIS — I4510 Unspecified right bundle-branch block: Secondary | ICD-10-CM

## 2020-02-23 NOTE — Progress Notes (Unsigned)
Cardiology Office Note    Date:  02/24/2020   ID:  Mike Henderson, DOB 12-19-1965, MRN [id number]  PCP:  Susy Frizzle, MD  Cardiologist:  Shelva Majestic, MD   Chief Complaint  Patient presents with  . Follow-up    Follow-up Sleep study   F/u sleep   History of Present Illness:  Mike Henderson is a 55 y.o. male who is followed by Dr. Jenna Luo for primary care.  Patient has a history of obstructive sleep apnea.  I had seen the patient in May 2014 and had not seen him until May 2021 when he was in need for a new machine.  He presents for a f/u evaluation after receiving a new Res Med AirSense 10 unit.   Mike Henderson is retired from the [employer].  He was diagnosed with obstructive sleep apnea in 2003 while in [place] surgery which did initially improve his sleep pattern.  In 2007 he had another sleep study at the [facility] hospital but apparently was never told of the results apparently was necessary.  I had seen him in 2014 when he had noticed progressive sleep issues.  His sleep was disruptive, he was experiencing frequent nocturia, morning and migraine headaches, increased blood pressure as well as fatigue.  An Epworth Sleepiness Scale score was 17.  He underwent a follow-up sleep study and was found to have mild overall sleep apnea with an AHI of 7.6/h and 10.5/h.  However during REM sleep, apnea was severe with an AHI of 37.3/h.  CPAP titration was performed on March 07, 2012 and he was titrated to 12 cm water pressure with excellent response.  When I saw him in 2014 is down and was excellent and AHI was 0.5 at a 12 cm water pressure.  His Epworth Sleepiness Scale score is significantly improved at 8.  I last saw him in May 2021.  Over the prior 7 years, he has continued to use CPAP therapy his old S9 Elite set at 12 cm..  He admits to a 40 pound weight loss.  He also has been diagnosed with diabetes mellitus for at least 4 years.  He previously had [facility] as  his DME company which was brought out by [employer].  His only goes to bed between 10:11 PM and wakes up at 5 AM.  He admits to being 100% compliance.  At times he still takes an afternoon nap.  He is on statin 40 mg daily for hyperlipidemia, levothyroxine 75 mcg for hypothyroidism, and is on Jardiance and Trulicity for his diabetes mellitus.  An Epworth scale score was recalculated in the office  and this endorsed at 7 arguing against excessive daytime sleepiness.   Since I last saw him, he received a new ResMed air sense 10 AutoSet CPAP unit with set up date on August 29, 2019.  He notes significant improvement with this machine compared to his prior machine.  He has been using a DreamWear nasal pillow mask and is tolerating this well.  A download was obtained from January 24, 2020 through February 22, 2020 which shows excellent compliance with 100% use and average usage 7 hours and 30 minutes.  His AutoSet unit is set at a minimum pressure of 10 and maximum of 18.  95th percentile pressure is 10.6 with maximum average 11.1.  AHI is excellent at 0.5.  There is mild mask leak.  Epworth Sleepiness Scale score was calculated in the office today and this endorsed  that 7 arguing against residual daytime sleepiness.  He presents for evaluation.  Past Medical History:  Diagnosis Date  . Anxiety    Panic attacks  . Arthritis   . Blood transfusion without reported diagnosis   . Chronic prostatitis   . Constipation   . Depression   . Diabetes mellitus without complication ([place])   . ED (erectile dysfunction)   . Epididymitis   . GERD (gastroesophageal reflux disease)   . Hyperlipidemia   . Migraines   . OSA (obstructive sleep apnea) 02/01/12   SLEEP STUDY [facility]  . RBBB   . Sleep apnea    uses Cpap  . Thyroid disease    hypothyroid    Past Surgical History:  Procedure Laterality Date  . ANAL RECTAL MANOMETRY N/A 03/14/2019   Procedure: ANO RECTAL MANOMETRY;  Surgeon: Doran Stabler, MD;  Location: [facility];  Service: Gastroenterology;  Laterality: N/A;  . COLONOSCOPY  2001   normal per pt. in [employer]  . ELBOW SURGERY Bilateral    for tennis elbow  . SHOULDER ARTHROSCOPY  09/2005   left  . SHOULDER ARTHROSCOPY  01/2006   right  . UMBILICAL HERNIA REPAIR  01/2009  . UVULOPALATOPHARYNGOPLASTY  08/2001    Current Medications: Outpatient Medications Prior to Visit  Medication Sig Dispense Refill  . Blood Glucose Monitoring Suppl (FREESTYLE LITE) DEVI Check BS bid E11.9 200 each 3  . clonazePAM (KLONOPIN) 0.5 MG tablet TAKE 1 TABLET(0.5 MG) BY MOUTH TWICE DAILY AS NEEDED FOR ANXIETY 60 tablet 0  . Dulaglutide (TRULICITY) 1.5 TD/9.7CB SOPN Inject 1.5 mg into the skin once a week. 12 pen 4  . empagliflozin (JARDIANCE) 25 MG TABS tablet Take 25 mg by mouth daily before breakfast. 90 tablet 3  . glucose blood (FREESTYLE LITE) test strip Check BS bid E11.9 200 each 3  . Lancets (FREESTYLE) lancets USE TO CHECK BLOOD SUGAR TWICE DAILY 100 each 6  . Lancets Misc. (ACCU-CHEK FASTCLIX LANCET) KIT Use to check BS bid DX: E11.9 1 kit 1  . lansoprazole (PREVACID) 30 MG capsule TAKE 1 CAPSULE DAILY BEFORE A MEAL 90 capsule 2  . metFORMIN (GLUCOPHAGE) 500 MG tablet TAKE 2 TABLETS (1000 MG TOTAL) TWICE A DAY WITH A MEAL 360 tablet 3  . Multiple Vitamin (MULTIVITAMIN) tablet Take 1 tablet by mouth daily.    . SYRINGE-NEEDLE, DISP, 3 ML (LUER LOCK SAFETY SYRINGES) 23G X 1" 3 ML MISC [place] with Testosterone injection q 2 weeks 50 each 2  . testosterone cypionate (DEPOTESTOSTERONE CYPIONATE) 200 MG/ML injection INJECT 1ML INTO THE MUSCLE EVERY 14 DAYS 10 mL 3  . TRINTELLIX 20 MG TABS tablet TAKE 1 TABLET DAILY 90 tablet 3  . UNABLE TO FIND Med Name:CPAP THERAPHY    . vardenafil (LEVITRA) 20 MG tablet Take 20 mg by mouth daily as needed.    Marland Kitchen atorvastatin (LIPITOR) 40 MG tablet TAKE 1 TABLET DAILY 90 tablet 3  . levothyroxine (SYNTHROID) 75 MCG tablet TAKE 1 TABLET(75 MCG) BY  MOUTH DAILY 90 tablet 3  . AMBULATORY NON FORMULARY MEDICATION Medication Name: Nitroglycerine ointment 0.125 %  Apply a pea sized amount internally three times daily. Dispense 30 GM zero refill 1 Tube 0  . sulfamethoxazole-trimethoprim (BACTRIM DS) 800-160 MG tablet Take 1 tablet by mouth 2 (two) times daily. 14 tablet 0   No facility-administered medications prior to visit.     Allergies:   Patient has no known allergies.   Social  History   Socioeconomic History  . Marital status: Married    Spouse name: Not on file  . Number of children: 1  . Years of education: Not on file  . Highest education level: Not on file  Occupational History  . Not on file  Tobacco Use  . Smoking status: Former Smoker    Types: Cigars  . Smokeless tobacco: Never Used  . Tobacco comment: social  Vaping Use  . Vaping Use: Never used  Substance and Sexual Activity  . Alcohol use: Yes    Alcohol/week: 2.0 standard drinks    Types: 2 Standard drinks or equivalent per week    Comment: socail 1 drink per week per pt  . Drug use: No  . Sexual activity: Not on file  Other Topics Concern  . Not on file  Social History Narrative  . Not on file   Social Determinants of Health   Financial Resource Strain: Not on file  Food Insecurity: Not on file  Transportation Needs: Not on file  Physical Activity: Not on file  Stress: Not on file  Social Connections: Not on file    Social history is notable that he is originally from [place], [place].  He is married for 5 years.  He has 1 child.  History of cigar use.  He does not routinely exercise.  Family History:  The patient's family history includes Cancer in his father and sister; Kidney disease in his mother; Muscular dystrophy in his mother.   Died at age 78 and had kidney failure and multiple sclerosis living at age 67.  He has 1 sister age 75.  His 1 child is 59 years old.  ROS General: Negative; No fevers, chills, or night sweats;   HEENT: Negative; No changes in vision or hearing, sinus congestion, difficulty swallowing Pulmonary: Negative; No cough, wheezing, shortness of breath, hemoptysis Cardiovascular: Negative; No chest pain, presyncope, syncope, palpitations GI: Negative; No nausea, vomiting, diarrhea, or abdominal pain GU: Negative; No dysuria, hematuria, or difficulty voiding Musculoskeletal: Negative; no myalgias, joint pain, or weakness Hematologic/Oncology: Negative; no easy bruising, bleeding Endocrine: Negative; no heat/cold intolerance; no diabetes Neuro: Negative; no changes in balance, headaches Skin: Negative; No rashes or skin lesions Psychiatric: Negative; No behavioral problems, depression Sleep: OSA on CPAP therapy.; No snoring, daytime sleepiness, hypersomnolence, bruxism, restless legs, hypnogognic hallucinations, no cataplexy Other comprehensive 14 point system review is negative.   PHYSICAL EXAM:   VS:  BP 115/75 (BP Location: Left Arm, Patient Position: Sitting)   Pulse 62   Ht 5' 11"  (1.803 m)   Wt 196 lb (88.9 kg)   SpO2 96%   BMI 27.34 kg/m     Repeat blood pressure 118/76.  Wt Readings from Last 3 Encounters:  02/23/20 196 lb (88.9 kg)  01/15/20 192 lb (87.1 kg)  08/22/19 189 lb (85.7 kg)     BP 115/75 (BP Location: Left Arm, Patient Position: Sitting)   Pulse 62   Ht 5' 11"  (1.803 m)   Wt 196 lb (88.9 kg)   SpO2 96%   BMI 27.34 kg/m  General: Alert, oriented, no distress.  Skin: normal turgor, no rashes, warm and dry HEENT: Normocephalic, atraumatic. Pupils equal round and reactive to light; sclera anicteric; extraocular muscles intact;  Nose without nasal septal hypertrophy Mouth/Parynx benign; status post remote UPPP surgery.  Significant dropping of his posterior soft palate Neck: No JVD, no carotid bruits; normal carotid upstroke Lungs: clear to ausculatation and percussion; no wheezing or  rales Chest wall: without tenderness to palpitation Heart: PMI not  displaced, RRR, s1 s2 normal, 1/6 systolic murmur, no diastolic murmur, no rubs, gallops, thrills, or heaves Abdomen: soft, nontender; no hepatosplenomehaly, BS+; abdominal aorta nontender and not dilated by palpation. Back: no CVA tenderness Pulses 2+ Musculoskeletal: full range of motion, normal strength, no joint deformities Extremities: no clubbing cyanosis or edema, Homan's sign negative  Neurologic: grossly nonfocal; Cranial nerves grossly wnl Psychologic: Normal mood and affect   Studies/Labs Reviewed:   EKG:  EKG is ordered today.  NSR at 62; RBBB with repolarization, Q in III  May 2021 ECG (independently read by me): Sinus bradycardia at 59 bpm, right bundle branch block.  Q wave 3 and aVF  Recent Labs: BMP Latest Ref Rng & Units 08/13/2019 04/09/2019 11/21/2018  Glucose 65 - 99 mg/dL 114(H) 203(H) 137(H)  BUN 7 - 25 mg/dL 16 7 17   Creatinine 0.70 - 1.33 mg/dL 0.76 0.73 0.74  BUN/Creat Ratio 6 - 22 (calc) NOT APPLICABLE NOT APPLICABLE NOT APPLICABLE  Sodium 433 - 146 mmol/L 138 138 138  Potassium 3.5 - 5.3 mmol/L 4.5 4.5 4.0  Chloride 98 - 110 mmol/L 104 102 102  CO2 20 - 32 mmol/L 24 26 26   Calcium 8.6 - 10.3 mg/dL 9.2 9.3 10.0     Hepatic Function Latest Ref Rng & Units 08/13/2019 04/09/2019 11/21/2018  Total Protein 6.1 - 8.1 g/dL 6.7 6.3 6.7  Albumin 3.6 - 5.1 g/dL - - -  AST 10 - 35 U/L 11 15 14   ALT 9 - 46 U/L 19 25 22   Alk Phosphatase 40 - 115 U/L - - -  Total Bilirubin 0.2 - 1.2 mg/dL 0.4 0.5 0.5    CBC Latest Ref Rng & Units 08/13/2019 04/09/2019 11/21/2018  WBC 3.8 - 10.8 Thousand/uL 6.2 6.0 6.8  Hemoglobin 13.2 - 17.1 g/dL 16.1 16.1 16.5  Hematocrit 38.5 - 50.0 % 48.2 46.8 48.9  Platelets 140 - 400 Thousand/uL 270 313 248   Lab Results  Component Value Date   MCV 93.2 08/13/2019   MCV 91.8 04/09/2019   MCV 92.3 11/21/2018   Lab Results  Component Value Date   TSH 2.99 08/13/2019   Lab Results  Component Value Date   HGBA1C 6.4 (H) 08/13/2019      BNP No results found for: BNP  ProBNP No results found for: PROBNP   Lipid Panel     Component Value Date/Time   CHOL 137 08/13/2019 0805   TRIG 67 08/13/2019 0805   HDL 43 08/13/2019 0805   CHOLHDL 3.2 08/13/2019 0805   VLDL 13 08/04/2016 0839   LDLCALC 80 08/13/2019 0805     RADIOLOGY: No results found.   Additional studies/ records that were reviewed today include:  I attain a new download from December 25 to February 22, 2020.  Usage days 100%, usage greater than 4 hours 93%.  Average use 7 hours 30 minutes.  AHI 0.5.  95th percentile leak 38.0 L/min  ASSESSMENT:    1. OSA (obstructive sleep apnea)   2. Type 2 diabetes mellitus without complication, without long-term current use of insulin ([place])   3. Hypothyroidism, unspecified type   4. RBBB     PLAN:  Mike Henderson is a 55 year-old gentleman who has a history of obstructive sleep apnea initially diagnosed in 2003 treated with UPPP surgery.  Due to progressive symptomatology in 2014 he was found to have mild overall sleep apnea which was severe during REM  sleep with an AHI during REM sleep at 37.3/h.  He subsequently underwent a CPAP titration received a S9 Elite ResMed CPAP unit set at 12 cm.  He had lost approximately 40 pounds over the years.  He received a new ResMed air sense 10 CPAP auto unit on August 29, 2019.  He has noticed dramatic improvement in the new technology.  His download obtained today shows excellent compliance.  His 95th percentile pressure is 10.6 with a maximum average of 11.1 and AHI is excellent at 0.5.  His blood pressure today is stable.  He remains active.  He has chronic right bundle branch block which is unchanged on his ECG.  He is diabetic on Jardiance and Metformin in addition to Trulicity.  He continues to be on atorvastatin 40 mg for hyperlipidemia with target LDL less than 70.  He has a history of hypothyroidism on levothyroxine 75 mcg daily.  He sees Dr. Jenna Luo for his  primary care who checks laboratory.  Long as he remains stable I will see him on an as-needed basis or if problems arise.   Medication Adjustments/Labs and Tests Ordered: Current medicines are reviewed at length with the patient today.  Concerns regarding medicines are outlined above.  Medication changes, Labs and Tests ordered today are listed in the Patient Instructions below. Patient Instructions  Medication Instructions:  The current medical regimen is effective;  continue present plan and medications.  *If you need a refill on your cardiac medications before your next appointment, please call your pharmacy*  Follow-Up: At [facility], you and your health needs are our priority.  As part of our continuing mission to provide you with exceptional heart care, we have created designated Provider Care Teams.  These Care Teams include your primary Cardiologist (physician) and Advanced Practice Providers (APPs -  Physician Assistants and Nurse Practitioners) who all work together to provide you with the care you need, when you need it.  We recommend signing up for the patient portal called "MyChart".  Sign up information is provided on this After Visit Summary.  MyChart is used to connect with patients for Virtual Visits (Telemedicine).  Patients are able to view lab/test results, encounter notes, upcoming appointments, etc.  Non-urgent messages can be sent to your provider as well.   To learn more about what you can do with MyChart, go to [email or web address].    Your next appointment:   As needed (sleep)  The format for your next appointment:   In Person  Provider:   Shelva Majestic, MD         Signed, Shelva Majestic, MD  02/24/2020 6:27 PM    [facility] [address] [place], [place], [place]  [address] Phone: [phone]

## 2020-02-23 NOTE — Patient Instructions (Signed)
Medication Instructions:  The current medical regimen is effective;  continue present plan and medications.  *If you need a refill on your cardiac medications before your next appointment, please call your pharmacy*  Follow-Up: At [facility], you and your health needs are our priority.  As part of our continuing mission to provide you with exceptional heart care, we have created designated Provider Care Teams.  These Care Teams include your primary Cardiologist (physician) and Advanced Practice Providers (APPs -  Physician Assistants and Nurse Practitioners) who all work together to provide you with the care you need, when you need it.  We recommend signing up for the patient portal called "MyChart".  Sign up information is provided on this After Visit Summary.  MyChart is used to connect with patients for Virtual Visits (Telemedicine).  Patients are able to view lab/test results, encounter notes, upcoming appointments, etc.  Non-urgent messages can be sent to your provider as well.   To learn more about what you can do with MyChart, go to [email or web address].    Your next appointment:   As needed (sleep)  The format for your next appointment:   In Person  Provider:   Shelva Majestic, MD

## 2020-02-24 ENCOUNTER — Encounter: Payer: Self-pay | Admitting: Cardiovascular Disease

## 2020-02-25 ENCOUNTER — Encounter: Payer: Self-pay | Admitting: Family Medicine

## 2020-02-25 ENCOUNTER — Other Ambulatory Visit: Payer: Self-pay | Admitting: *Deleted

## 2020-02-25 DIAGNOSIS — E785 Hyperlipidemia, unspecified: Secondary | ICD-10-CM

## 2020-02-25 DIAGNOSIS — E039 Hypothyroidism, unspecified: Secondary | ICD-10-CM

## 2020-02-25 DIAGNOSIS — Z13220 Encounter for screening for lipoid disorders: Secondary | ICD-10-CM

## 2020-02-25 DIAGNOSIS — E1165 Type 2 diabetes mellitus with hyperglycemia: Secondary | ICD-10-CM

## 2020-02-26 ENCOUNTER — Other Ambulatory Visit: Payer: Self-pay | Admitting: Family Medicine

## 2020-02-26 DIAGNOSIS — F419 Anxiety disorder, unspecified: Secondary | ICD-10-CM

## 2020-02-26 NOTE — Telephone Encounter (Signed)
Ok to refill??  Last office visit 01/15/2020.  Last refill 01/22/2020.  Ok to add refills to prescription?

## 2020-03-05 ENCOUNTER — Other Ambulatory Visit

## 2020-03-05 ENCOUNTER — Other Ambulatory Visit: Payer: Self-pay

## 2020-03-05 DIAGNOSIS — Z136 Encounter for screening for cardiovascular disorders: Secondary | ICD-10-CM

## 2020-03-05 DIAGNOSIS — E1165 Type 2 diabetes mellitus with hyperglycemia: Secondary | ICD-10-CM

## 2020-03-05 DIAGNOSIS — E785 Hyperlipidemia, unspecified: Secondary | ICD-10-CM

## 2020-03-05 DIAGNOSIS — Z13220 Encounter for screening for lipoid disorders: Secondary | ICD-10-CM

## 2020-03-05 DIAGNOSIS — E039 Hypothyroidism, unspecified: Secondary | ICD-10-CM

## 2020-03-06 LAB — CBC WITH DIFFERENTIAL/PLATELET
Absolute Monocytes: 664 cells/uL (ref 200–950)
Basophils Absolute: 120 cells/uL (ref 0–200)
Basophils Relative: 1.5 %
Eosinophils Absolute: 184 cells/uL (ref 15–500)
Eosinophils Relative: 2.3 %
HCT: 46.8 % (ref 38.5–50.0)
Hemoglobin: 16.2 g/dL (ref 13.2–17.1)
Lymphs Abs: 2688 cells/uL (ref 850–3900)
MCH: 32 pg (ref 27.0–33.0)
MCHC: 34.6 g/dL (ref 32.0–36.0)
MCV: 92.5 fL (ref 80.0–100.0)
MPV: 9.6 fL (ref 7.5–12.5)
Monocytes Relative: 8.3 %
Neutro Abs: 4344 cells/uL (ref 1500–7800)
Neutrophils Relative %: 54.3 %
Platelets: 277 10*3/uL (ref 140–400)
RBC: 5.06 10*6/uL (ref 4.20–5.80)
RDW: 11.7 % (ref 11.0–15.0)
Total Lymphocyte: 33.6 %
WBC: 8 10*3/uL (ref 3.8–10.8)

## 2020-03-06 LAB — COMPLETE METABOLIC PANEL WITH GFR
AG Ratio: 2 (calc) (ref 1.0–2.5)
ALT: 25 U/L (ref 9–46)
AST: 15 U/L (ref 10–35)
Albumin: 4.4 g/dL (ref 3.6–5.1)
Alkaline phosphatase (APISO): 76 U/L (ref 35–144)
BUN: 16 mg/dL (ref 7–25)
CO2: 26 mmol/L (ref 20–32)
Calcium: 9.3 mg/dL (ref 8.6–10.3)
Chloride: 105 mmol/L (ref 98–110)
Creat: 0.76 mg/dL (ref 0.70–1.33)
GFR, Est African American: 119 mL/min/{1.73_m2} (ref >=60)
GFR, Est Non African American: 103 mL/min/{1.73_m2} (ref >=60)
Globulin: 2.2 g/dL (calc) (ref 1.9–3.7)
Glucose, Bld: 121 mg/dL — ABNORMAL HIGH (ref 65–99)
Potassium: 4.6 mmol/L (ref 3.5–5.3)
Sodium: 139 mmol/L (ref 135–146)
Total Bilirubin: 0.3 mg/dL (ref 0.2–1.2)
Total Protein: 6.6 g/dL (ref 6.1–8.1)

## 2020-03-06 LAB — LIPID PANEL
Cholesterol: 131 mg/dL (ref <200)
HDL: 47 mg/dL (ref >=40)
LDL Cholesterol (Calc): 67 mg/dL (calc)
Non-HDL Cholesterol (Calc): 84 mg/dL (calc) (ref <130)
Total CHOL/HDL Ratio: 2.8 (calc) (ref <5.0)
Triglycerides: 85 mg/dL (ref <150)

## 2020-03-06 LAB — HEMOGLOBIN A1C
Hgb A1c MFr Bld: 7.2 % of total Hgb — ABNORMAL HIGH (ref <5.7)
Mean Plasma Glucose: 160 mg/dL
eAG (mmol/L): 8.9 mmol/L

## 2020-03-06 LAB — TSH: TSH: 3.34 mIU/L (ref 0.40–4.50)

## 2020-03-07 ENCOUNTER — Other Ambulatory Visit: Payer: Self-pay | Admitting: Family Medicine

## 2020-03-08 ENCOUNTER — Other Ambulatory Visit: Payer: Self-pay

## 2020-03-08 DIAGNOSIS — E1165 Type 2 diabetes mellitus with hyperglycemia: Secondary | ICD-10-CM

## 2020-03-08 DIAGNOSIS — E785 Hyperlipidemia, unspecified: Secondary | ICD-10-CM

## 2020-03-11 ENCOUNTER — Ambulatory Visit: Admitting: Family Medicine

## 2020-05-11 ENCOUNTER — Other Ambulatory Visit: Payer: Self-pay | Admitting: Family Medicine

## 2020-06-13 ENCOUNTER — Encounter: Payer: Self-pay | Admitting: Family Medicine

## 2020-06-13 ENCOUNTER — Other Ambulatory Visit: Payer: Self-pay | Admitting: Family Medicine

## 2020-06-14 ENCOUNTER — Other Ambulatory Visit: Payer: Self-pay | Admitting: *Deleted

## 2020-06-14 DIAGNOSIS — E039 Hypothyroidism, unspecified: Secondary | ICD-10-CM

## 2020-06-14 DIAGNOSIS — E785 Hyperlipidemia, unspecified: Secondary | ICD-10-CM

## 2020-06-14 DIAGNOSIS — E1165 Type 2 diabetes mellitus with hyperglycemia: Secondary | ICD-10-CM

## 2020-06-14 DIAGNOSIS — E291 Testicular hypofunction: Secondary | ICD-10-CM

## 2020-06-14 DIAGNOSIS — Z13220 Encounter for screening for lipoid disorders: Secondary | ICD-10-CM

## 2020-06-23 ENCOUNTER — Other Ambulatory Visit

## 2020-06-23 ENCOUNTER — Other Ambulatory Visit: Payer: Self-pay

## 2020-06-23 DIAGNOSIS — Z13220 Encounter for screening for lipoid disorders: Secondary | ICD-10-CM

## 2020-06-23 DIAGNOSIS — E039 Hypothyroidism, unspecified: Secondary | ICD-10-CM

## 2020-06-23 DIAGNOSIS — E1165 Type 2 diabetes mellitus with hyperglycemia: Secondary | ICD-10-CM

## 2020-06-23 DIAGNOSIS — E785 Hyperlipidemia, unspecified: Secondary | ICD-10-CM

## 2020-06-23 DIAGNOSIS — E291 Testicular hypofunction: Secondary | ICD-10-CM

## 2020-06-24 ENCOUNTER — Other Ambulatory Visit: Payer: Self-pay | Admitting: *Deleted

## 2020-06-24 DIAGNOSIS — E039 Hypothyroidism, unspecified: Secondary | ICD-10-CM

## 2020-06-24 LAB — LIPID PANEL
Cholesterol: 159 mg/dL (ref <200)
HDL: 57 mg/dL (ref >=40)
LDL Cholesterol (Calc): 77 mg/dL (calc)
Non-HDL Cholesterol (Calc): 102 mg/dL (calc) (ref <130)
Total CHOL/HDL Ratio: 2.8 (calc) (ref <5.0)
Triglycerides: 155 mg/dL — ABNORMAL HIGH (ref <150)

## 2020-06-24 LAB — COMPLETE METABOLIC PANEL WITH GFR
AG Ratio: 1.8 (calc) (ref 1.0–2.5)
ALT: 18 U/L (ref 9–46)
AST: 14 U/L (ref 10–35)
Albumin: 4.7 g/dL (ref 3.6–5.1)
Alkaline phosphatase (APISO): 81 U/L (ref 35–144)
BUN/Creatinine Ratio: 20 (calc) (ref 6–22)
BUN: 14 mg/dL (ref 7–25)
CO2: 27 mmol/L (ref 20–32)
Calcium: 10 mg/dL (ref 8.6–10.3)
Chloride: 103 mmol/L (ref 98–110)
Creat: 0.69 mg/dL — ABNORMAL LOW (ref 0.70–1.33)
GFR, Est African American: 124 mL/min/{1.73_m2} (ref >=60)
GFR, Est Non African American: 107 mL/min/{1.73_m2} (ref >=60)
Globulin: 2.6 g/dL (calc) (ref 1.9–3.7)
Glucose, Bld: 137 mg/dL — ABNORMAL HIGH (ref 65–99)
Potassium: 4.7 mmol/L (ref 3.5–5.3)
Sodium: 139 mmol/L (ref 135–146)
Total Bilirubin: 0.4 mg/dL (ref 0.2–1.2)
Total Protein: 7.3 g/dL (ref 6.1–8.1)

## 2020-06-24 LAB — CBC WITH DIFFERENTIAL/PLATELET
Absolute Monocytes: 624 cells/uL (ref 200–950)
Basophils Absolute: 123 cells/uL (ref 0–200)
Basophils Relative: 1.6 %
Eosinophils Absolute: 169 cells/uL (ref 15–500)
Eosinophils Relative: 2.2 %
HCT: 50.3 % — ABNORMAL HIGH (ref 38.5–50.0)
Hemoglobin: 17.1 g/dL (ref 13.2–17.1)
Lymphs Abs: 2410 cells/uL (ref 850–3900)
MCH: 31.9 pg (ref 27.0–33.0)
MCHC: 34 g/dL (ref 32.0–36.0)
MCV: 93.8 fL (ref 80.0–100.0)
MPV: 9.8 fL (ref 7.5–12.5)
Monocytes Relative: 8.1 %
Neutro Abs: 4374 cells/uL (ref 1500–7800)
Neutrophils Relative %: 56.8 %
Platelets: 306 10*3/uL (ref 140–400)
RBC: 5.36 10*6/uL (ref 4.20–5.80)
RDW: 11.8 % (ref 11.0–15.0)
Total Lymphocyte: 31.3 %
WBC: 7.7 10*3/uL (ref 3.8–10.8)

## 2020-06-24 LAB — TESTOSTERONE: Testosterone: 628 ng/dL (ref 250–827)

## 2020-06-24 LAB — HEMOGLOBIN A1C
Hgb A1c MFr Bld: 6.6 % of total Hgb — ABNORMAL HIGH (ref <5.7)
Mean Plasma Glucose: 143 mg/dL
eAG (mmol/L): 7.9 mmol/L

## 2020-06-24 LAB — TSH: TSH: 6.41 mIU/L — ABNORMAL HIGH (ref 0.40–4.50)

## 2020-06-24 MED ORDER — LEVOTHYROXINE SODIUM 88 MCG PO TABS: 88.0000 ug | ORAL_TABLET | Freq: Every day | ORAL | 3 refills | Status: DC

## 2020-07-20 ENCOUNTER — Other Ambulatory Visit: Payer: Self-pay | Admitting: Family Medicine

## 2020-07-20 DIAGNOSIS — F419 Anxiety disorder, unspecified: Secondary | ICD-10-CM

## 2020-07-21 NOTE — Telephone Encounter (Signed)
Ok to refill??  Last office visit 01/15/2020.  Last refill 02/26/2020, #3 refills.

## 2020-08-19 ENCOUNTER — Other Ambulatory Visit

## 2020-08-19 ENCOUNTER — Other Ambulatory Visit: Payer: Self-pay

## 2020-08-19 ENCOUNTER — Encounter: Payer: Self-pay | Admitting: Family Medicine

## 2020-08-19 DIAGNOSIS — E039 Hypothyroidism, unspecified: Secondary | ICD-10-CM

## 2020-08-20 LAB — TSH: TSH: 4.04 mIU/L (ref 0.40–4.50)

## 2020-08-20 LAB — EXTRA LAV TOP TUBE

## 2020-09-21 ENCOUNTER — Other Ambulatory Visit: Payer: Self-pay

## 2020-09-21 DIAGNOSIS — E039 Hypothyroidism, unspecified: Secondary | ICD-10-CM

## 2020-09-21 MED ORDER — LEVOTHYROXINE SODIUM 88 MCG PO TABS: 88.0000 ug | ORAL_TABLET | Freq: Every day | ORAL | 0 refills | Status: DC

## 2020-10-25 ENCOUNTER — Encounter: Payer: Self-pay | Admitting: Family Medicine

## 2020-10-25 DIAGNOSIS — N486 Induration penis plastica: Secondary | ICD-10-CM

## 2020-12-03 ENCOUNTER — Other Ambulatory Visit: Payer: Self-pay | Admitting: Family Medicine

## 2020-12-03 DIAGNOSIS — F419 Anxiety disorder, unspecified: Secondary | ICD-10-CM

## 2020-12-03 NOTE — Telephone Encounter (Signed)
LOV 01/15/20 (constipation) Last refill 07/22/20, #60, 3 refills  Please review. Thank you!

## 2020-12-20 ENCOUNTER — Other Ambulatory Visit: Payer: Self-pay | Admitting: Family Medicine

## 2020-12-20 DIAGNOSIS — E039 Hypothyroidism, unspecified: Secondary | ICD-10-CM

## 2021-02-08 ENCOUNTER — Encounter: Payer: Self-pay | Admitting: Family Medicine

## 2021-02-08 DIAGNOSIS — E785 Hyperlipidemia, unspecified: Secondary | ICD-10-CM

## 2021-02-08 DIAGNOSIS — E1165 Type 2 diabetes mellitus with hyperglycemia: Secondary | ICD-10-CM

## 2021-02-08 DIAGNOSIS — Z0000 Encounter for general adult medical examination without abnormal findings: Secondary | ICD-10-CM

## 2021-02-08 DIAGNOSIS — E039 Hypothyroidism, unspecified: Secondary | ICD-10-CM

## 2021-02-15 ENCOUNTER — Other Ambulatory Visit: Payer: Self-pay

## 2021-02-15 ENCOUNTER — Other Ambulatory Visit

## 2021-02-15 DIAGNOSIS — Z0000 Encounter for general adult medical examination without abnormal findings: Secondary | ICD-10-CM

## 2021-02-15 DIAGNOSIS — E785 Hyperlipidemia, unspecified: Secondary | ICD-10-CM

## 2021-02-15 DIAGNOSIS — E039 Hypothyroidism, unspecified: Secondary | ICD-10-CM

## 2021-02-15 DIAGNOSIS — E1165 Type 2 diabetes mellitus with hyperglycemia: Secondary | ICD-10-CM

## 2021-02-17 DIAGNOSIS — M722 Plantar fascial fibromatosis: Secondary | ICD-10-CM | POA: Insufficient documentation

## 2021-02-17 DIAGNOSIS — M1990 Unspecified osteoarthritis, unspecified site: Secondary | ICD-10-CM | POA: Insufficient documentation

## 2021-02-17 DIAGNOSIS — K2100 Gastro-esophageal reflux disease with esophagitis, without bleeding: Secondary | ICD-10-CM | POA: Insufficient documentation

## 2021-02-17 DIAGNOSIS — M2390 Unspecified internal derangement of unspecified knee: Secondary | ICD-10-CM | POA: Insufficient documentation

## 2021-02-17 DIAGNOSIS — F32A Depression, unspecified: Secondary | ICD-10-CM | POA: Insufficient documentation

## 2021-02-17 DIAGNOSIS — R69 Illness, unspecified: Secondary | ICD-10-CM | POA: Insufficient documentation

## 2021-02-18 ENCOUNTER — Other Ambulatory Visit: Payer: Self-pay

## 2021-02-18 ENCOUNTER — Ambulatory Visit (INDEPENDENT_AMBULATORY_CARE_PROVIDER_SITE_OTHER): Admitting: Family Medicine

## 2021-02-18 ENCOUNTER — Ambulatory Visit: Admitting: Family Medicine

## 2021-02-18 ENCOUNTER — Encounter: Payer: Self-pay | Admitting: Family Medicine

## 2021-02-18 VITALS — BP 128/82 | HR 72 | Temp 97.7°F | Resp 18 | Ht 71.0 in | Wt 192.0 lb

## 2021-02-18 DIAGNOSIS — E039 Hypothyroidism, unspecified: Secondary | ICD-10-CM

## 2021-02-18 DIAGNOSIS — E119 Type 2 diabetes mellitus without complications: Secondary | ICD-10-CM | POA: Diagnosis not present

## 2021-02-18 DIAGNOSIS — Z0000 Encounter for general adult medical examination without abnormal findings: Secondary | ICD-10-CM | POA: Diagnosis not present

## 2021-02-18 DIAGNOSIS — F331 Major depressive disorder, recurrent, moderate: Secondary | ICD-10-CM

## 2021-02-18 DIAGNOSIS — Z125 Encounter for screening for malignant neoplasm of prostate: Secondary | ICD-10-CM

## 2021-02-18 LAB — CBC WITH DIFFERENTIAL/PLATELET
Absolute Monocytes: 554 cells/uL (ref 200–950)
Basophils Absolute: 88 cells/uL (ref 0–200)
Basophils Relative: 1.4 %
Eosinophils Absolute: 113 cells/uL (ref 15–500)
Eosinophils Relative: 1.8 %
HCT: 48.6 % (ref 38.5–50.0)
Hemoglobin: 16.4 g/dL (ref 13.2–17.1)
Lymphs Abs: 2350 cells/uL (ref 850–3900)
MCH: 31.2 pg (ref 27.0–33.0)
MCHC: 33.7 g/dL (ref 32.0–36.0)
MCV: 92.4 fL (ref 80.0–100.0)
MPV: 9.8 fL (ref 7.5–12.5)
Monocytes Relative: 8.8 %
Neutro Abs: 3194 cells/uL (ref 1500–7800)
Neutrophils Relative %: 50.7 %
Platelets: 272 10*3/uL (ref 140–400)
RBC: 5.26 10*6/uL (ref 4.20–5.80)
RDW: 11.8 % (ref 11.0–15.0)
Total Lymphocyte: 37.3 %
WBC: 6.3 10*3/uL (ref 3.8–10.8)

## 2021-02-18 LAB — TEST AUTHORIZATION

## 2021-02-18 LAB — LIPID PANEL
Cholesterol: 157 mg/dL (ref <200)
HDL: 49 mg/dL (ref >=40)
LDL Cholesterol (Calc): 86 mg/dL (calc)
Non-HDL Cholesterol (Calc): 108 mg/dL (calc) (ref <130)
Total CHOL/HDL Ratio: 3.2 (calc) (ref <5.0)
Triglycerides: 128 mg/dL (ref <150)

## 2021-02-18 LAB — COMPREHENSIVE METABOLIC PANEL
AG Ratio: 1.8 (calc) (ref 1.0–2.5)
ALT: 20 U/L (ref 9–46)
AST: 12 U/L (ref 10–35)
Albumin: 4.6 g/dL (ref 3.6–5.1)
Alkaline phosphatase (APISO): 77 U/L (ref 35–144)
BUN: 17 mg/dL (ref 7–25)
CO2: 29 mmol/L (ref 20–32)
Calcium: 9.8 mg/dL (ref 8.6–10.3)
Chloride: 102 mmol/L (ref 98–110)
Creat: 0.8 mg/dL (ref 0.70–1.30)
Globulin: 2.5 g/dL (calc) (ref 1.9–3.7)
Glucose, Bld: 128 mg/dL — ABNORMAL HIGH (ref 65–99)
Potassium: 4.3 mmol/L (ref 3.5–5.3)
Sodium: 139 mmol/L (ref 135–146)
Total Bilirubin: 0.5 mg/dL (ref 0.2–1.2)
Total Protein: 7.1 g/dL (ref 6.1–8.1)

## 2021-02-18 LAB — PSA: PSA: 0.26 ng/mL (ref <=4.00)

## 2021-02-18 LAB — HEMOGLOBIN A1C
Hgb A1c MFr Bld: 7.3 % of total Hgb — ABNORMAL HIGH (ref <5.7)
Mean Plasma Glucose: 163 mg/dL
eAG (mmol/L): 9 mmol/L

## 2021-02-18 LAB — TSH: TSH: 3.44 mIU/L (ref 0.40–4.50)

## 2021-02-18 NOTE — Progress Notes (Signed)
Subjective:    Patient ID: Mike Henderson, male    DOB: Jul 27, 1965, 56 y.o.   MRN: [id number] Patient is a very pleasant 56 year old Caucasian gentleman here today for complete physical exam.  He has been battling severe depression.  He has a longstanding history of depression and has been on numerous antidepressants since the 1990s.  These include Zoloft, Prozac, venlafaxine, and ultimately he is on Trintellix at the present time.  Despite being on Trintellix 20 mg a day he still reports anhedonia.  He reports feeling pessimistic about the future.  He reports feeling sad for no reason.  He is having trouble sleeping.  He feels anxious.  He denies any suicidal thoughts however the medications are just not effectively controlling his depression.  He had his colonoscopy earlier this year and they recommended a repeat colonoscopy in 10 years that he would be due in 2022.  He is due for PSA screening for prostate cancer.  He is due for a flu shot which he politely declined.  He is due for COVID booster.  His most recent lab work is listed below: Appointment on 02/15/2021  Component Date Value Ref Range Status   TSH 02/15/2021 3.44  0.40 - 4.50 mIU/L Final   Hgb A1c MFr Bld 02/15/2021 7.3 (H)  <5.7 % of total Hgb Final   Comment: For someone without known diabetes, a hemoglobin A1c value of 6.5% or greater indicates that they may have  diabetes and this should be confirmed with a follow-up  test. . For someone with known diabetes, a value <7% indicates  that their diabetes is well controlled and a value  greater than or equal to 7% indicates suboptimal  control. A1c targets should be individualized based on  duration of diabetes, age, comorbid conditions, and  other considerations. . Currently, no consensus exists regarding use of hemoglobin A1c for diagnosis of diabetes for children. .    Mean Plasma Glucose 02/15/2021 163  mg/dL Final   eAG (mmol/L) 02/15/2021 9.0  mmol/L Final    Cholesterol 02/15/2021 157  <200 mg/dL Final   HDL 02/15/2021 49  > OR = 40 mg/dL Final   Triglycerides 02/15/2021 128  <150 mg/dL Final   LDL Cholesterol (Calc) 02/15/2021 86  mg/dL (calc) Final   Comment: Reference range: <100 . Desirable range <100 mg/dL for primary prevention;   <70 mg/dL for patients with CHD or diabetic patients  with > or = 2 CHD risk factors. Marland Kitchen LDL-C is now calculated using the Martin-Hopkins  calculation, which is a validated novel method providing  better accuracy than the Friedewald equation in the  estimation of LDL-C.  Cresenciano Genre et al. Annamaria Helling. 6546;503(54): 2061-2068  (http://education.[facility].com/faq/FAQ164)    Total CHOL/HDL Ratio 02/15/2021 3.2  <5.0 (calc) Final   Non-HDL Cholesterol (Calc) 02/15/2021 108  <130 mg/dL (calc) Final   Comment: For patients with diabetes plus 1 major ASCVD risk  factor, treating to a non-HDL-C goal of <100 mg/dL  (LDL-C of <70 mg/dL) is considered a therapeutic  option.    WBC 02/15/2021 6.3  3.8 - 10.8 Thousand/uL Final   RBC 02/15/2021 5.26  4.20 - 5.80 Million/uL Final   Hemoglobin 02/15/2021 16.4  13.2 - 17.1 g/dL Final   HCT 02/15/2021 48.6  38.5 - 50.0 % Final   MCV 02/15/2021 92.4  80.0 - 100.0 fL Final   MCH 02/15/2021 31.2  27.0 - 33.0 pg Final   MCHC 02/15/2021 33.7  32.0 - 36.0 g/dL Final  RDW 02/15/2021 11.8  11.0 - 15.0 % Final   Platelets 02/15/2021 272  140 - 400 Thousand/uL Final   MPV 02/15/2021 9.8  7.5 - 12.5 fL Final   Neutro Abs 02/15/2021 3,194  1,500 - 7,800 cells/uL Final   Lymphs Abs 02/15/2021 2,350  850 - 3,900 cells/uL Final   Absolute Monocytes 02/15/2021 554  200 - 950 cells/uL Final   Eosinophils Absolute 02/15/2021 113  15 - 500 cells/uL Final   Basophils Absolute 02/15/2021 88  0 - 200 cells/uL Final   Neutrophils Relative % 02/15/2021 50.7  % Final   Total Lymphocyte 02/15/2021 37.3  % Final   Monocytes Relative 02/15/2021 8.8  % Final   Eosinophils Relative 02/15/2021  1.8  % Final   Basophils Relative 02/15/2021 1.4  % Final   Glucose, Bld 02/15/2021 128 (H)  65 - 99 mg/dL Final   Comment: .            Fasting reference interval . For someone without known diabetes, a glucose value >125 mg/dL indicates that they may have diabetes and this should be confirmed with a follow-up test. .    BUN 02/15/2021 17  7 - 25 mg/dL Final   Creat 02/15/2021 0.80  0.70 - 1.30 mg/dL Final   BUN/Creatinine Ratio 57/84/6962 NOT APPLICABLE  6 - 22 (calc) Final   Sodium 02/15/2021 139  135 - 146 mmol/L Final   Potassium 02/15/2021 4.3  3.5 - 5.3 mmol/L Final   Chloride 02/15/2021 102  98 - 110 mmol/L Final   CO2 02/15/2021 29  20 - 32 mmol/L Final   Calcium 02/15/2021 9.8  8.6 - 10.3 mg/dL Final   Total Protein 02/15/2021 7.1  6.1 - 8.1 g/dL Final   Albumin 02/15/2021 4.6  3.6 - 5.1 g/dL Final   Globulin 02/15/2021 2.5  1.9 - 3.7 g/dL (calc) Final   AG Ratio 02/15/2021 1.8  1.0 - 2.5 (calc) Final   Total Bilirubin 02/15/2021 0.5  0.2 - 1.2 mg/dL Final   Alkaline phosphatase (APISO) 02/15/2021 77  35 - 144 U/L Final   AST 02/15/2021 12  10 - 35 U/L Final   ALT 02/15/2021 20  9 - 46 U/L Final    Past Medical History:  Diagnosis Date   Anxiety    Panic attacks   Arthritis    Blood transfusion without reported diagnosis    Chronic prostatitis    Constipation    Depression    Diabetes mellitus without complication (HCC)    ED (erectile dysfunction)    Epididymitis    GERD (gastroesophageal reflux disease)    Hyperlipidemia    Migraines    OSA (obstructive sleep apnea) 02/01/12   SLEEP STUDY [facility]   RBBB    Sleep apnea    uses Cpap   Thyroid disease    hypothyroid   Past Surgical History:  Procedure Laterality Date   ANAL RECTAL MANOMETRY N/A 03/14/2019   Procedure: ANO RECTAL MANOMETRY;  Surgeon: Mike Stabler, MD;  Location: [facility];  Service: Gastroenterology;  Laterality: N/A;   COLONOSCOPY  2001   normal per  pt. in [place] Bilateral    for tennis elbow   SHOULDER ARTHROSCOPY  09/2005   left   SHOULDER ARTHROSCOPY  10/5282   right   UMBILICAL HERNIA REPAIR  01/2009   UVULOPALATOPHARYNGOPLASTY  08/2001   Current Outpatient Medications on File Prior to Visit  Medication Sig Dispense  Refill   amoxicillin (AMOXIL) 875 MG tablet Take 875 mg by mouth 2 (two) times daily.     atorvastatin (LIPITOR) 40 MG tablet TAKE 1 TABLET DAILY 90 tablet 3   Blood Glucose Monitoring Suppl (FREESTYLE LITE) DEVI Check BS bid E11.9 200 each 3   clonazePAM (KLONOPIN) 0.5 MG tablet TAKE 1 TABLET(0.5 MG) BY MOUTH TWICE DAILY AS NEEDED FOR ANXIETY 60 tablet 3   glucose blood (FREESTYLE LITE) test strip Check BS bid E11.9 200 each 3   JARDIANCE 25 MG TABS tablet TAKE 1 TABLET DAILY BEFORE BREAKFAST 90 tablet 3   Lancets (FREESTYLE) lancets USE TO CHECK BLOOD SUGAR TWICE DAILY 100 each 6   Lancets Misc. (ACCU-CHEK FASTCLIX LANCET) KIT Use to check BS bid DX: E11.9 1 kit 1   lansoprazole (PREVACID) 30 MG capsule TAKE 1 CAPSULE DAILY BEFORE A MEAL 90 capsule 3   levothyroxine (SYNTHROID) 88 MCG tablet TAKE 1 TABLET DAILY 90 tablet 3   metFORMIN (GLUCOPHAGE) 500 MG tablet TAKE 2 TABLETS TWICE A DAY WITH MEALS 360 tablet 3   Multiple Vitamin (MULTIVITAMIN) tablet Take 1 tablet by mouth daily.     TRINTELLIX 20 MG TABS tablet TAKE 1 TABLET DAILY 90 tablet 3   TRULICITY 1.5 GE/3.6OQ SOPN INJECT 1.5 MG UNDER THE SKIN ONCE A WEEK 6 mL 3   UNABLE TO FIND Med Name:CPAP THERAPHY     vardenafil (LEVITRA) 20 MG tablet Take 20 mg by mouth daily as needed.     SYRINGE-NEEDLE, DISP, 3 ML (LUER LOCK SAFETY SYRINGES) 23G X 1" 3 ML MISC [place] with Testosterone injection q 2 weeks (Patient not taking: Reported on 02/18/2021) 50 each 2   testosterone cypionate (DEPOTESTOSTERONE CYPIONATE) 200 MG/ML injection INJECT 1ML INTO THE MUSCLE EVERY 14 DAYS (Patient not taking: Reported on 02/18/2021) 10 mL 3   No current  facility-administered medications on file prior to visit.   No Known Allergies Social History   Socioeconomic History   Marital status: Married    Spouse name: Not on file   Number of children: 1   Years of education: Not on file   Highest education level: Not on file  Occupational History   Not on file  Tobacco Use   Smoking status: Former    Types: Cigars   Smokeless tobacco: Never   Tobacco comments:    social  Vaping Use   Vaping Use: Never used  Substance and Sexual Activity   Alcohol use: Yes    Alcohol/week: 2.0 standard drinks    Types: 2 Standard drinks or equivalent per week    Comment: socail 1 drink per week per pt   Drug use: No   Sexual activity: Not on file  Other Topics Concern   Not on file  Social History Narrative   Not on file   Social Determinants of Health   Financial Resource Strain: Not on file  Food Insecurity: Not on file  Transportation Needs: Not on file  Physical Activity: Not on file  Stress: Not on file  Social Connections: Not on file  Intimate Partner Violence: Not on file      Review of Systems  All other systems reviewed and are negative.     Objective:   Physical Exam Vitals reviewed.  Constitutional:      General: He is not in acute distress.    Appearance: Normal appearance. He is well-developed and normal weight. He is not ill-appearing, toxic-appearing or diaphoretic.  HENT:  Head: Normocephalic and atraumatic.     Right Ear: Tympanic membrane, ear canal and external ear normal. There is no impacted cerumen.     Left Ear: Tympanic membrane, ear canal and external ear normal. There is no impacted cerumen.     Nose: Nose normal. No congestion or rhinorrhea.     Mouth/Throat:     Mouth: Mucous membranes are moist.     Pharynx: No oropharyngeal exudate or posterior oropharyngeal erythema.  Eyes:     General: No scleral icterus.       Right eye: No discharge.        Left eye: No discharge.     Extraocular  Movements: Extraocular movements intact.     Conjunctiva/sclera: Conjunctivae normal.     Pupils: Pupils are equal, round, and reactive to light.  Neck:     Vascular: No carotid bruit or JVD.  Cardiovascular:     Rate and Rhythm: Normal rate and regular rhythm.     Heart sounds: Normal heart sounds. No murmur heard.   No friction rub. No gallop.  Pulmonary:     Effort: Pulmonary effort is normal. No respiratory distress.     Breath sounds: Normal breath sounds. No stridor. No wheezing, rhonchi or rales.  Chest:     Chest wall: No tenderness.  Abdominal:     General: Abdomen is flat. Bowel sounds are normal. There is no distension.     Palpations: Abdomen is soft. There is no mass.     Tenderness: There is no abdominal tenderness. There is no right CVA tenderness, left CVA tenderness, guarding or rebound.     Hernia: No hernia is present.  Musculoskeletal:        General: Normal range of motion.     Left shoulder: Tenderness present. Normal strength.     Cervical back: Normal range of motion and neck supple. No rigidity or tenderness.     Right lower leg: No edema.     Left lower leg: No edema.  Lymphadenopathy:     Cervical: No cervical adenopathy.  Skin:    General: Skin is warm.     Coloration: Skin is not jaundiced or pale.     Findings: No bruising, erythema, lesion or rash.  Neurological:     General: No focal deficit present.     Mental Status: He is alert and oriented to person, place, and time. Mental status is at baseline.     Cranial Nerves: No cranial nerve deficit.     Sensory: No sensory deficit.     Motor: No weakness.     Coordination: Coordination normal.     Gait: Gait normal.     Deep Tendon Reflexes: Reflexes normal.  Psychiatric:        Mood and Affect: Mood normal.        Behavior: Behavior normal.        Thought Content: Thought content normal.        Judgment: Judgment normal.          Assessment & Plan:  General medical  exam  Hypothyroidism, unspecified type  Controlled type 2 diabetes mellitus without complication, without long-term current use of insulin (HCC)  Moderate episode of recurrent major depressive disorder (HCC)  Prostate cancer screening Colonoscopy is up-to-date.  I will add a PSA to screen for prostate cancer.  Recommended a flu shot as well as a COVID booster with patient politely deferred.  However I am most concerned by his uncontrolled depression.  I gave the patient samples of Vraylar 1.5 mg daily.  We will reassess in 3 weeks to see if this is helping.  We will add this in addition to his Trintellix in an effort to augment this.  He is also been seeing a therapist as an outpatient without any benefit.  Therefore I feel that this is the next best option for this patient.

## 2021-03-04 ENCOUNTER — Other Ambulatory Visit: Payer: Self-pay | Admitting: Family Medicine

## 2021-03-07 ENCOUNTER — Encounter: Payer: Self-pay | Admitting: Family Medicine

## 2021-03-07 ENCOUNTER — Other Ambulatory Visit: Payer: Self-pay | Admitting: Family Medicine

## 2021-03-07 MED ORDER — CARIPRAZINE HCL 1.5 MG PO CAPS: 1.5000 mg | ORAL_CAPSULE | Freq: Every day | ORAL | 5 refills | Status: DC

## 2021-03-07 NOTE — Telephone Encounter (Signed)
Per LOV 02/18/21 pt was given Vraylar 1.5 mg daily samples  Please advise, thanks!

## 2021-03-14 ENCOUNTER — Other Ambulatory Visit: Payer: Self-pay | Admitting: Family Medicine

## 2021-03-15 ENCOUNTER — Other Ambulatory Visit: Payer: Self-pay | Admitting: Family Medicine

## 2021-04-14 ENCOUNTER — Other Ambulatory Visit: Payer: Self-pay | Admitting: Family Medicine

## 2021-04-15 NOTE — Telephone Encounter (Signed)
LOV 02/18/21 ?Last refill 12/03/20, #60, 3 refills ? ?Please review, thanks! ? ?

## 2021-05-07 ENCOUNTER — Other Ambulatory Visit: Payer: Self-pay | Admitting: Family Medicine

## 2021-06-13 ENCOUNTER — Encounter: Payer: Self-pay | Admitting: Family Medicine

## 2021-06-13 ENCOUNTER — Other Ambulatory Visit: Payer: Self-pay | Admitting: Family Medicine

## 2021-06-14 NOTE — Telephone Encounter (Signed)
Had his annual physical in Jan 2023 even though protocol is indicating he hasn't had a visit within the last 12 months.   Gave a refill for #90 with 3 refills.    ? ?Requested Prescriptions  ?Pending Prescriptions Disp Refills  ?? lansoprazole (PREVACID) 30 MG capsule [Pharmacy Med Name: LANSOPRAZOLE DR CAPS '30MG'$ ] 90 capsule 3  ?  Sig: TAKE 1 CAPSULE DAILY BEFORE A MEAL  ?  ? Gastroenterology: Proton Pump Inhibitors 2 Failed - 06/13/2021  7:27 AM  ?  ?  Failed - Valid encounter within last 12 months  ?  Recent Outpatient Visits   ?      ? 3 months ago General medical exam  ? [facility] Susy Frizzle, MD  ? 1 year ago Constipation due to outlet dysfunction  ? [facility] Pickard, [name], MD  ? 1 year ago General medical exam  ? [facility] Susy Frizzle, MD  ? 1 year ago Cellulitis of left little finger  ? [facility] Pickard, [name], MD  ? 2 years ago Hypothyroidism, unspecified type  ? [facility] Pickard, [name], MD  ?  ?  ? ?  ?  ?  Passed - ALT in normal range and within 360 days  ?  ALT  ?Date Value Ref Range Status  ?02/15/2021 20 9 - 46 U/L Final  ?   ?  ?  Passed - AST in normal range and within 360 days  ?  AST  ?Date Value Ref Range Status  ?02/15/2021 12 10 - 35 U/L Final  ?   ?  ?  ? ?

## 2021-08-02 ENCOUNTER — Encounter: Payer: Self-pay | Admitting: Family Medicine

## 2021-08-04 ENCOUNTER — Other Ambulatory Visit: Payer: Self-pay | Admitting: Family Medicine

## 2021-08-04 DIAGNOSIS — F419 Anxiety disorder, unspecified: Secondary | ICD-10-CM

## 2021-08-04 NOTE — Telephone Encounter (Signed)
Pt has not been seen since 01/15/20   LOV 01/15/20 Last refill 04/15/21, #60, 2 refills  Please review, thanks!

## 2021-08-05 ENCOUNTER — Encounter: Payer: Self-pay | Admitting: Family Medicine

## 2021-08-05 NOTE — Telephone Encounter (Signed)
Spoke w/pt this morning, advice pt that his meds were in fact called into his pharmacy yesterday 08/04/21

## 2021-08-09 ENCOUNTER — Other Ambulatory Visit

## 2021-08-09 DIAGNOSIS — E785 Hyperlipidemia, unspecified: Secondary | ICD-10-CM

## 2021-08-09 DIAGNOSIS — E039 Hypothyroidism, unspecified: Secondary | ICD-10-CM

## 2021-08-09 DIAGNOSIS — N411 Chronic prostatitis: Secondary | ICD-10-CM

## 2021-08-09 DIAGNOSIS — E291 Testicular hypofunction: Secondary | ICD-10-CM

## 2021-08-09 DIAGNOSIS — E1165 Type 2 diabetes mellitus with hyperglycemia: Secondary | ICD-10-CM

## 2021-08-10 ENCOUNTER — Encounter: Payer: Self-pay | Admitting: Family Medicine

## 2021-08-10 LAB — COMPREHENSIVE METABOLIC PANEL
AG Ratio: 2.3 (calc) (ref 1.0–2.5)
ALT: 18 U/L (ref 9–46)
AST: 13 U/L (ref 10–35)
Albumin: 4.8 g/dL (ref 3.6–5.1)
Alkaline phosphatase (APISO): 78 U/L (ref 35–144)
BUN: 12 mg/dL (ref 7–25)
CO2: 24 mmol/L (ref 20–32)
Calcium: 9.6 mg/dL (ref 8.6–10.3)
Chloride: 104 mmol/L (ref 98–110)
Creat: 0.77 mg/dL (ref 0.70–1.30)
Globulin: 2.1 g/dL (calc) (ref 1.9–3.7)
Glucose, Bld: 137 mg/dL — ABNORMAL HIGH (ref 65–99)
Potassium: 4.5 mmol/L (ref 3.5–5.3)
Sodium: 138 mmol/L (ref 135–146)
Total Bilirubin: 0.5 mg/dL (ref 0.2–1.2)
Total Protein: 6.9 g/dL (ref 6.1–8.1)

## 2021-08-10 LAB — CBC WITH DIFFERENTIAL/PLATELET
Absolute Monocytes: 511 cells/uL (ref 200–950)
Basophils Absolute: 79 cells/uL (ref 0–200)
Basophils Relative: 1.1 %
Eosinophils Absolute: 101 cells/uL (ref 15–500)
Eosinophils Relative: 1.4 %
HCT: 48.7 % (ref 38.5–50.0)
Hemoglobin: 17 g/dL (ref 13.2–17.1)
Lymphs Abs: 2405 cells/uL (ref 850–3900)
MCH: 32.1 pg (ref 27.0–33.0)
MCHC: 34.9 g/dL (ref 32.0–36.0)
MCV: 92.1 fL (ref 80.0–100.0)
MPV: 9.9 fL (ref 7.5–12.5)
Monocytes Relative: 7.1 %
Neutro Abs: 4104 cells/uL (ref 1500–7800)
Neutrophils Relative %: 57 %
Platelets: 277 10*3/uL (ref 140–400)
RBC: 5.29 10*6/uL (ref 4.20–5.80)
RDW: 12 % (ref 11.0–15.0)
Total Lymphocyte: 33.4 %
WBC: 7.2 10*3/uL (ref 3.8–10.8)

## 2021-08-10 LAB — LIPID PANEL
Cholesterol: 140 mg/dL (ref <200)
HDL: 47 mg/dL (ref >=40)
LDL Cholesterol (Calc): 76 mg/dL (calc)
Non-HDL Cholesterol (Calc): 93 mg/dL (calc) (ref <130)
Total CHOL/HDL Ratio: 3 (calc) (ref <5.0)
Triglycerides: 88 mg/dL (ref <150)

## 2021-08-10 LAB — HEMOGLOBIN A1C
Hgb A1c MFr Bld: 8.1 % of total Hgb — ABNORMAL HIGH (ref <5.7)
Mean Plasma Glucose: 186 mg/dL
eAG (mmol/L): 10.3 mmol/L

## 2021-08-10 LAB — MICROALBUMIN / CREATININE URINE RATIO
Creatinine, Urine: 49 mg/dL (ref 20–320)
Microalb, Ur: <0.2 mg/dL

## 2021-08-18 ENCOUNTER — Encounter: Payer: Self-pay | Admitting: Family Medicine

## 2021-08-18 ENCOUNTER — Ambulatory Visit: Admitting: Family Medicine

## 2021-08-18 VITALS — BP 124/78 | HR 75 | Temp 97.5°F | Ht 71.0 in | Wt 185.2 lb

## 2021-08-18 DIAGNOSIS — F331 Major depressive disorder, recurrent, moderate: Secondary | ICD-10-CM

## 2021-08-18 DIAGNOSIS — M25512 Pain in left shoulder: Secondary | ICD-10-CM | POA: Diagnosis not present

## 2021-08-18 DIAGNOSIS — E039 Hypothyroidism, unspecified: Secondary | ICD-10-CM

## 2021-08-18 DIAGNOSIS — E1165 Type 2 diabetes mellitus with hyperglycemia: Secondary | ICD-10-CM

## 2021-08-18 DIAGNOSIS — G8929 Other chronic pain: Secondary | ICD-10-CM

## 2021-08-18 MED ORDER — PIOGLITAZONE HCL 30 MG PO TABS: 30.0000 mg | ORAL_TABLET | Freq: Every day | ORAL | 5 refills | Status: DC

## 2021-08-18 MED ORDER — BREXPIPRAZOLE 1 MG PO TABS
1.0000 mg | ORAL_TABLET | Freq: Every day | ORAL | 5 refills | Status: DC
Start: 2021-08-18 — End: 2021-08-23

## 2021-08-18 NOTE — Progress Notes (Signed)
Subjective:    Patient ID: Mike Henderson, male    DOB: [birth year], 56 y.o.   MRN: [id number] "patient is a very pleasant 56 year old Caucasian gentleman here today for complete physical exam.  He has been battling severe depression.  He has a longstanding history of depression and has been on numerous antidepressants since the 1990s.  These include Zoloft, Prozac, venlafaxine, and ultimately he is on Trintellix at the present time.  Despite being on Trintellix 20 mg a day he still reports anhedonia.  He reports feeling pessimistic about the future.  He reports feeling sad for no reason.  He is having trouble sleeping.  He feels anxious.  He denies any suicidal thoughts however the medications are just not effectively controlling his depression"  The above exerpt is from his physical exam in January.  At that time we added Vraylar.  Patient states that on Vraylar, he saw benefit with regards to managing his depression.  However he developed movement issues.  He reported a restless feeling in his legs.  He was having some uncontrolled muscle movements.  All of this was problematic for him.  Therefore he discontinued the medication and the restless feeling in his legs and the muscle movements improved.  However the depression and anxiety has spiraled out of control is gotten much worse.  He states that he has very little energy.  His appetite has been affected.  He is having trouble sleeping.  The Klonopin is no longer effective for him.  He does not endorse suicidal thoughts.  He does complain of left shoulder pain.  He has pain with abduction greater than 90 degrees.  He is requesting a cortisone injection in his left shoulder due to the pain as the pain is keeping him from sleeping.  His blood pressure is excellent at 124/78.  His most recent lab work is listed below and shows that his A1c is poorly controlled at 8.0 Lab on 08/09/2021  Component Date Value Ref Range Status   Creatinine, Urine  08/09/2021 49  20 - 320 mg/dL Final   Microalb, Ur 08/09/2021 <0.2  mg/dL Final   Comment: Reference Range Not established    Microalb Creat Ratio 08/09/2021 NOTE  <30 mcg/mg creat Final   Comment: NOTE: The urine albumin value is less than  0.2 mg/dL therefore we are unable to calculate  excretion and/or creatinine ratio. . The ADA defines abnormalities in albumin excretion as follows: Marland Kitchen Albuminuria Category        Result (mcg/mg creatinine) . Normal to Mildly increased   <30 Moderately increased         30-299  Severely increased           > OR = 300 . The ADA recommends that at least two of three specimens collected within a 3-6 month period be abnormal before considering a patient to be within a diagnostic category.    Hgb A1c MFr Bld 08/09/2021 8.1 (H)  <5.7 % of total Hgb Final   Comment: For someone without known diabetes, a hemoglobin A1c value of 6.5% or greater indicates that they may have  diabetes and this should be confirmed with a follow-up  test. . For someone with known diabetes, a value <7% indicates  that their diabetes is well controlled and a value  greater than or equal to 7% indicates suboptimal  control. A1c targets should be individualized based on  duration of diabetes, age, comorbid conditions, and  other considerations. . Currently, no  consensus exists regarding use of hemoglobin A1c for diagnosis of diabetes for children. .    Mean Plasma Glucose 08/09/2021 186  mg/dL Final   eAG (mmol/L) 08/09/2021 10.3  mmol/L Final   WBC 08/09/2021 7.2  3.8 - 10.8 Thousand/uL Final   RBC 08/09/2021 5.29  4.20 - 5.80 Million/uL Final   Hemoglobin 08/09/2021 17.0  13.2 - 17.1 g/dL Final   HCT 08/09/2021 48.7  38.5 - 50.0 % Final   MCV 08/09/2021 92.1  80.0 - 100.0 fL Final   MCH 08/09/2021 32.1  27.0 - 33.0 pg Final   MCHC 08/09/2021 34.9  32.0 - 36.0 g/dL Final   RDW 08/09/2021 12.0  11.0 - 15.0 % Final   Platelets 08/09/2021 277  140 - 400 Thousand/uL  Final   MPV 08/09/2021 9.9  7.5 - 12.5 fL Final   Neutro Abs 08/09/2021 4,104  1,500 - 7,800 cells/uL Final   Lymphs Abs 08/09/2021 2,405  850 - 3,900 cells/uL Final   Absolute Monocytes 08/09/2021 511  200 - 950 cells/uL Final   Eosinophils Absolute 08/09/2021 101  15 - 500 cells/uL Final   Basophils Absolute 08/09/2021 79  0 - 200 cells/uL Final   Neutrophils Relative % 08/09/2021 57  % Final   Total Lymphocyte 08/09/2021 33.4  % Final   Monocytes Relative 08/09/2021 7.1  % Final   Eosinophils Relative 08/09/2021 1.4  % Final   Basophils Relative 08/09/2021 1.1  % Final   Glucose, Bld 08/09/2021 137 (H)  65 - 99 mg/dL Final   Comment: .            Fasting reference interval . For someone without known diabetes, a glucose value >125 mg/dL indicates that they may have diabetes and this should be confirmed with a follow-up test. .    BUN 08/09/2021 12  7 - 25 mg/dL Final   Creat 08/09/2021 0.77  0.70 - 1.30 mg/dL Final   BUN/Creatinine Ratio 26/20/3559 NOT APPLICABLE  6 - 22 (calc) Final   Sodium 08/09/2021 138  135 - 146 mmol/L Final   Potassium 08/09/2021 4.5  3.5 - 5.3 mmol/L Final   Chloride 08/09/2021 104  98 - 110 mmol/L Final   CO2 08/09/2021 24  20 - 32 mmol/L Final   Calcium 08/09/2021 9.6  8.6 - 10.3 mg/dL Final   Total Protein 08/09/2021 6.9  6.1 - 8.1 g/dL Final   Albumin 08/09/2021 4.8  3.6 - 5.1 g/dL Final   Globulin 08/09/2021 2.1  1.9 - 3.7 g/dL (calc) Final   AG Ratio 08/09/2021 2.3  1.0 - 2.5 (calc) Final   Total Bilirubin 08/09/2021 0.5  0.2 - 1.2 mg/dL Final   Alkaline phosphatase (APISO) 08/09/2021 78  35 - 144 U/L Final   AST 08/09/2021 13  10 - 35 U/L Final   ALT 08/09/2021 18  9 - 46 U/L Final   Cholesterol 08/09/2021 140  <200 mg/dL Final   HDL 08/09/2021 47  > OR = 40 mg/dL Final   Triglycerides 08/09/2021 88  <150 mg/dL Final   LDL Cholesterol (Calc) 08/09/2021 76  mg/dL (calc) Final   Comment: Reference range: <100 . Desirable range <100 mg/dL  for primary prevention;   <70 mg/dL for patients with CHD or diabetic patients  with > or = 2 CHD risk factors. Marland Kitchen LDL-C is now calculated using the Martin-Hopkins  calculation, which is a validated novel method providing  better accuracy than the Friedewald equation in the  estimation of LDL-C.  Cresenciano Genre et al. Annamaria Helling. 7867;544(92): 2061-2068  (http://education.[facility].com/faq/FAQ164)    Total CHOL/HDL Ratio 08/09/2021 3.0  <5.0 (calc) Final   Non-HDL Cholesterol (Calc) 08/09/2021 93  <130 mg/dL (calc) Final   Comment: For patients with diabetes plus 1 major ASCVD risk  factor, treating to a non-HDL-C goal of <100 mg/dL  (LDL-C of <70 mg/dL) is considered a therapeutic  option.       Past Medical History:  Diagnosis Date   Anxiety    Panic attacks   Arthritis    Blood transfusion without reported diagnosis    Chronic prostatitis    Constipation    Depression    Diabetes mellitus without complication (HCC)    ED (erectile dysfunction)    Epididymitis    GERD (gastroesophageal reflux disease)    Hyperlipidemia    Migraines    OSA (obstructive sleep apnea) 02/01/12   SLEEP STUDY [facility]   RBBB    Sleep apnea    uses Cpap   Thyroid disease    hypothyroid   Past Surgical History:  Procedure Laterality Date   ANAL RECTAL MANOMETRY N/A 03/14/2019   Procedure: ANO RECTAL MANOMETRY;  Surgeon: Doran Stabler, MD;  Location: [facility];  Service: Gastroenterology;  Laterality: N/A;   COLONOSCOPY  2001   normal per pt. in [place] Bilateral    for tennis elbow   SHOULDER ARTHROSCOPY  09/2005   left   SHOULDER ARTHROSCOPY  0/1007   right   UMBILICAL HERNIA REPAIR  01/2009   UVULOPALATOPHARYNGOPLASTY  08/2001   Current Outpatient Medications on File Prior to Visit  Medication Sig Dispense Refill   atorvastatin (LIPITOR) 40 MG tablet TAKE 1 TABLET DAILY 90 tablet 3   Blood Glucose Monitoring Suppl (FREESTYLE LITE) DEVI  Check BS bid E11.9 200 each 3   clonazePAM (KLONOPIN) 0.5 MG tablet TAKE 1 TABLET(0.5 MG) BY MOUTH TWICE DAILY AS NEEDED FOR ANXIETY 60 tablet 2   glucose blood (FREESTYLE LITE) test strip Check BS bid E11.9 200 each 3   JARDIANCE 25 MG TABS tablet TAKE 1 TABLET DAILY BEFORE BREAKFAST 90 tablet 3   Lancets (FREESTYLE) lancets USE TO CHECK BLOOD SUGAR TWICE DAILY 100 each 6   Lancets Misc. (ACCU-CHEK FASTCLIX LANCET) KIT Use to check BS bid DX: E11.9 1 kit 1   lansoprazole (PREVACID) 30 MG capsule TAKE 1 CAPSULE DAILY BEFORE A MEAL 90 capsule 3   levothyroxine (SYNTHROID) 88 MCG tablet TAKE 1 TABLET DAILY 90 tablet 3   metFORMIN (GLUCOPHAGE) 500 MG tablet TAKE 2 TABLETS TWICE A DAY WITH MEALS 360 tablet 3   Multiple Vitamin (MULTIVITAMIN) tablet Take 1 tablet by mouth daily.     SYRINGE-NEEDLE, DISP, 3 ML (LUER LOCK SAFETY SYRINGES) 23G X 1" 3 ML MISC [place] with Testosterone injection q 2 weeks 50 each 2   TRINTELLIX 20 MG TABS tablet TAKE 1 TABLET DAILY 90 tablet 3   TRULICITY 1.5 HQ/1.9XJ SOPN INJECT 1.5 MG UNDER THE SKIN ONCE A WEEK 6 mL 3   UNABLE TO FIND Med Name:CPAP THERAPHY     vardenafil (LEVITRA) 20 MG tablet Take 20 mg by mouth daily as needed.     testosterone cypionate (DEPOTESTOSTERONE CYPIONATE) 200 MG/ML injection INJECT 1ML INTO THE MUSCLE EVERY 14 DAYS (Patient not taking: Reported on 08/18/2021) 10 mL 3   No current facility-administered medications on file prior to visit.   No Known Allergies Social History   Socioeconomic  History   Marital status: Married    Spouse name: Not on file   Number of children: 1   Years of education: Not on file   Highest education level: Not on file  Occupational History   Not on file  Tobacco Use   Smoking status: Former    Types: Cigars   Smokeless tobacco: Never   Tobacco comments:    social  Vaping Use   Vaping Use: Never used  Substance and Sexual Activity   Alcohol use: Yes    Alcohol/week: 2.0 standard drinks of alcohol     Types: 2 Standard drinks or equivalent per week    Comment: socail 1 drink per week per pt   Drug use: No   Sexual activity: Not on file  Other Topics Concern   Not on file  Social History Narrative   Not on file   Social Determinants of Health   Financial Resource Strain: Not on file  Food Insecurity: Not on file  Transportation Needs: Not on file  Physical Activity: Not on file  Stress: Not on file  Social Connections: Not on file  Intimate Partner Violence: Not on file      Review of Systems  All other systems reviewed and are negative.      Objective:   Physical Exam Vitals reviewed.  Constitutional:      General: He is not in acute distress.    Appearance: Normal appearance. He is well-developed and normal weight. He is not ill-appearing, toxic-appearing or diaphoretic.  HENT:     Head: Normocephalic and atraumatic.     Right Ear: Tympanic membrane, ear canal and external ear normal. There is no impacted cerumen.     Left Ear: Tympanic membrane, ear canal and external ear normal. There is no impacted cerumen.     Nose: Nose normal. No congestion or rhinorrhea.     Mouth/Throat:     Mouth: Mucous membranes are moist.     Pharynx: No oropharyngeal exudate or posterior oropharyngeal erythema.  Eyes:     General: No scleral icterus.       Right eye: No discharge.        Left eye: No discharge.     Extraocular Movements: Extraocular movements intact.     Conjunctiva/sclera: Conjunctivae normal.     Pupils: Pupils are equal, round, and reactive to light.  Neck:     Vascular: No carotid bruit or JVD.  Cardiovascular:     Rate and Rhythm: Normal rate and regular rhythm.     Heart sounds: Normal heart sounds. No murmur heard.    No friction rub. No gallop.  Pulmonary:     Effort: Pulmonary effort is normal. No respiratory distress.     Breath sounds: Normal breath sounds. No stridor. No wheezing, rhonchi or rales.  Chest:     Chest wall: No tenderness.   Abdominal:     General: Abdomen is flat. Bowel sounds are normal. There is no distension.     Palpations: Abdomen is soft. There is no mass.     Tenderness: There is no abdominal tenderness. There is no right CVA tenderness, left CVA tenderness, guarding or rebound.     Hernia: No hernia is present.  Musculoskeletal:        General: Normal range of motion.     Left shoulder: Tenderness present. Normal strength.     Cervical back: Normal range of motion and neck supple. No rigidity or tenderness.  Right lower leg: No edema.     Left lower leg: No edema.  Lymphadenopathy:     Cervical: No cervical adenopathy.  Skin:    General: Skin is warm.     Coloration: Skin is not jaundiced or pale.     Findings: No bruising, erythema, lesion or rash.  Neurological:     General: No focal deficit present.     Mental Status: He is alert and oriented to person, place, and time. Mental status is at baseline.     Cranial Nerves: No cranial nerve deficit.     Sensory: No sensory deficit.     Motor: No weakness.     Coordination: Coordination normal.     Gait: Gait normal.     Deep Tendon Reflexes: Reflexes normal.  Psychiatric:        Mood and Affect: Mood normal.        Behavior: Behavior normal.        Thought Content: Thought content normal.        Judgment: Judgment normal.           Assessment & Plan:  Moderate episode of recurrent major depressive disorder ([place]) - Plan: Ambulatory referral to Psychiatry  Hypothyroidism, unspecified type - Plan: TSH  Uncontrolled type 2 diabetes mellitus with hyperglycemia ([place])  Chronic left shoulder pain We discussed adding Wellbutrin versus Lamictal versus trying a different antipsychotic such as Rexulti to manage his severe depression.  Ultimately the patient would like to try Rexulti.  Begin 1 mg daily and then reassess in 1 month.  I also placed a referral to psychiatry for counseling and assistance in medication management.  I will check a  TSH to monitor his hypothyroidism.  His blood pressure is outstanding.  I will add Actos 30 mg daily to help with his diabetes and recheck in 3 to 6 months.  Using sterile technique, I injected his left shoulder with a mixture of 2 cc of lidocaine, 2 cc of Marcaine, and 2 cc of 40 mg per mill Kenalog.  I suspect impingement syndrome

## 2021-08-19 LAB — TSH: TSH: 2.93 mIU/L (ref 0.40–4.50)

## 2021-08-23 ENCOUNTER — Other Ambulatory Visit: Payer: Self-pay | Admitting: Family Medicine

## 2021-08-23 MED ORDER — BREXPIPRAZOLE 1 MG PO TABS
1.0000 mg | ORAL_TABLET | Freq: Every day | ORAL | 5 refills | Status: DC
Start: 2021-08-23 — End: 2022-09-19

## 2021-08-27 ENCOUNTER — Other Ambulatory Visit: Payer: Self-pay | Admitting: Family Medicine

## 2021-08-29 NOTE — Telephone Encounter (Signed)
Requested Prescriptions  Pending Prescriptions Disp Refills  . metFORMIN (GLUCOPHAGE) 500 MG tablet [Pharmacy Med Name: METFORMIN HCL TABS 500MG] 360 tablet 2    Sig: TAKE 2 TABLETS TWICE A DAY WITH MEALS     Endocrinology:  Diabetes - Biguanides Failed - 08/27/2021  7:03 AM      Failed - HBA1C is between 0 and 7.9 and within 180 days    Hgb A1c MFr Bld  Date Value Ref Range Status  08/09/2021 8.1 (H) <5.7 % of total Hgb Final    Comment:    For someone without known diabetes, a hemoglobin A1c value of 6.5% or greater indicates that they may have  diabetes and this should be confirmed with a follow-up  test. . For someone with known diabetes, a value <7% indicates  that their diabetes is well controlled and a value  greater than or equal to 7% indicates suboptimal  control. A1c targets should be individualized based on  duration of diabetes, age, comorbid conditions, and  other considerations. . Currently, no consensus exists regarding use of hemoglobin A1c for diagnosis of diabetes for children. .          Failed - eGFR in normal range and within 360 days    GFR, Est African American  Date Value Ref Range Status  06/23/2020 124 > OR = 60 mL/min/1.63m Final   GFR, Est Non African American  Date Value Ref Range Status  06/23/2020 107 > OR = 60 mL/min/1.764mFinal         Failed - B12 Level in normal range and within 720 days    Vitamin B-12  Date Value Ref Range Status  02/04/2018 555 200 - 1,100 pg/mL Final         Failed - Valid encounter within last 6 months    Recent Outpatient Visits          6 months ago General medical exam   [facility] Fri[facility]   1 year ago Constipation due to outlet dysfunction   [place], [place] McgeeMD   2 years ago General medical exam   [facility] Frizzle[facility]   2 years ago Cellulitis of left little finger   [place] Scha[place] McgeeMD   2 years ago Hypothyroidism, unspecified type   [place], [place]ie McgeeMD             Passed - Cr in normal range and within 360 days    Creat  Date Value Ref Range Status  08/09/2021 0.77 0.70 - 1.30 mg/dL Final   Creatinine, Urine  Date Value Ref Range Status  08/09/2021 49 20 - 320 mg/dL Final         Passed - CBC within normal limits and completed in the last 12 months    WBC  Date Value Ref Range Status  08/09/2021 7.2 3.8 - 10.8 Thousand/uL Final   RBC  Date Value Ref Range Status  08/09/2021 5.29 4.20 - 5.80 Million/uL Final   Hemoglobin  Date Value Ref Range Status  08/09/2021 17.0 13.2 - 17.1 g/dL Final   HCT  Date Value Ref Range Status  08/09/2021 48.7 38.5 - 50.0 % Final   MCHC  Date Value Ref Range Status  08/09/2021 34.9 32.0 - 36.0 g/dL Final   [facility]  08/09/2021 32.1 27.0 - 33.0 pg  Final   MCV  Date Value Ref Range Status  08/09/2021 92.1 80.0 - 100.0 fL Final   No results found for: "PLTCOUNTKUC", "LABPLAT", "POCPLA" RDW  Date Value Ref Range Status  08/09/2021 12.0 11.0 - 15.0 % Final

## 2021-09-20 ENCOUNTER — Telehealth: Payer: Self-pay

## 2021-09-20 NOTE — Telephone Encounter (Signed)
Called and spoke w/pt re pioglitazone med. Per pt does not want to refill w/[facility] until November, for now just keep it w/[facility].

## 2021-10-31 ENCOUNTER — Encounter: Payer: Self-pay | Admitting: Family Medicine

## 2021-11-18 ENCOUNTER — Encounter: Payer: Self-pay | Admitting: Family Medicine

## 2021-11-22 ENCOUNTER — Other Ambulatory Visit: Payer: Self-pay | Admitting: Family Medicine

## 2021-11-22 DIAGNOSIS — F419 Anxiety disorder, unspecified: Secondary | ICD-10-CM

## 2021-11-22 NOTE — Telephone Encounter (Signed)
Requested medication (s) are due for refill today - yes  Requested medication (s) are on the active medication list -yes  Future visit scheduled -no  Last refill: 08/04/21 #60 2RF  Notes to clinic: non delegated Rx  Requested Prescriptions  Pending Prescriptions Disp Refills   clonazePAM (KLONOPIN) 0.5 MG tablet [Pharmacy Med Name: CLONAZEPAM 0.'5MG'$  TABLETS] 60 tablet     Sig: TAKE 1 TABLET(0.5 MG) BY MOUTH TWICE DAILY AS NEEDED FOR ANXIETY     Not Delegated - Psychiatry: Anxiolytics/Hypnotics 2 Failed - 11/22/2021  9:10 AM      Failed - This refill cannot be delegated      Failed - Urine Drug Screen completed in last 360 days      Failed - Valid encounter within last 6 months    Recent Outpatient Visits           9 months ago General medical exam   [place] Susy Frizzle, MD   1 year ago Constipation due to outlet dysfunction   [place] Pickard, [name], MD   2 years ago General medical exam   [place] Susy Frizzle, MD   2 years ago Cellulitis of left little finger   [place] Dennard Schaumann, [name], MD   2 years ago Hypothyroidism, unspecified type   [place], [name], MD              Passed - Patient is not pregnant         Requested Prescriptions  Pending Prescriptions Disp Refills   clonazePAM (KLONOPIN) 0.5 MG tablet [Pharmacy Med Name: CLONAZEPAM 0.'5MG'$  TABLETS] 60 tablet     Sig: TAKE 1 TABLET(0.5 MG) BY MOUTH TWICE DAILY AS NEEDED FOR ANXIETY     Not Delegated - Psychiatry: Anxiolytics/Hypnotics 2 Failed - 11/22/2021  9:10 AM      Failed - This refill cannot be delegated      Failed - Urine Drug Screen completed in last 360 days      Failed - Valid encounter within last 6 months    Recent Outpatient Visits           9 months ago General medical exam   [place] Susy Frizzle, MD   1 year ago Constipation due to  outlet dysfunction   [place] Pickard, [name], MD   2 years ago General medical exam   [place] Susy Frizzle, MD   2 years ago Cellulitis of left little finger   [place] Dennard Schaumann, [name], MD   2 years ago Hypothyroidism, unspecified type   [place], [name], MD              Passed - Patient is not pregnant

## 2021-12-09 LAB — HM DIABETES EYE EXAM

## 2021-12-10 ENCOUNTER — Other Ambulatory Visit: Payer: Self-pay | Admitting: Family Medicine

## 2021-12-12 NOTE — Telephone Encounter (Signed)
Unable to refill per protocol, Rx request is too soon. Last refill 08/18/21 for 30 and 5 rf. Should have enough until December. Will refuse.  Requested Prescriptions  Pending Prescriptions Disp Refills   pioglitazone (ACTOS) 30 MG tablet [Pharmacy Med Name: PIOGLITAZONE '30MG'$  TABLETS] 30 tablet 5    Sig: TAKE 1 TABLET(30 MG) BY MOUTH DAILY     Endocrinology:  Diabetes - Glitazones - pioglitazone Failed - 12/10/2021  8:08 AM      Failed - HBA1C is between 0 and 7.9 and within 180 days    Hgb A1c MFr Bld  Date Value Ref Range Status  08/09/2021 8.1 (H) <5.7 % of total Hgb Final    Comment:    For someone without known diabetes, a hemoglobin A1c value of 6.5% or greater indicates that they may have  diabetes and this should be confirmed with a follow-up  test. . For someone with known diabetes, a value <7% indicates  that their diabetes is well controlled and a value  greater than or equal to 7% indicates suboptimal  control. A1c targets should be individualized based on  duration of diabetes, age, comorbid conditions, and  other considerations. . Currently, no consensus exists regarding use of hemoglobin A1c for diagnosis of diabetes for children. .          Failed - Valid encounter within last 6 months    Recent Outpatient Visits           9 months ago General medical exam   [place] Susy Frizzle, MD   1 year ago Constipation due to outlet dysfunction   [place] Pickard, [name], MD   2 years ago General medical exam   [place] Susy Frizzle, MD   2 years ago Cellulitis of left little finger   [place] Denn[place] Schaumann, [name], MD   2 years ago Hypothyroidism, unspecified type   [place], [name], MD

## 2022-01-04 ENCOUNTER — Other Ambulatory Visit: Payer: Self-pay | Admitting: Family Medicine

## 2022-01-04 DIAGNOSIS — E039 Hypothyroidism, unspecified: Secondary | ICD-10-CM

## 2022-02-02 ENCOUNTER — Other Ambulatory Visit: Payer: Self-pay

## 2022-02-02 ENCOUNTER — Telehealth: Payer: Self-pay

## 2022-02-02 DIAGNOSIS — E1165 Type 2 diabetes mellitus with hyperglycemia: Secondary | ICD-10-CM

## 2022-02-02 DIAGNOSIS — E785 Hyperlipidemia, unspecified: Secondary | ICD-10-CM

## 2022-02-02 DIAGNOSIS — E291 Testicular hypofunction: Secondary | ICD-10-CM

## 2022-02-02 DIAGNOSIS — N486 Induration penis plastica: Secondary | ICD-10-CM

## 2022-02-02 DIAGNOSIS — N529 Male erectile dysfunction, unspecified: Secondary | ICD-10-CM

## 2022-02-02 DIAGNOSIS — E039 Hypothyroidism, unspecified: Secondary | ICD-10-CM

## 2022-02-02 NOTE — Telephone Encounter (Signed)
Pt called in to schedule his 6 month lab appt to check his A1C. Pt is scheduled for 02/09/22 and would like for his orders for this to be put in prior to this appt please. Please advise.  Cb#: [phone]

## 2022-02-03 ENCOUNTER — Other Ambulatory Visit: Payer: Self-pay | Admitting: Family Medicine

## 2022-02-03 DIAGNOSIS — F419 Anxiety disorder, unspecified: Secondary | ICD-10-CM

## 2022-02-03 NOTE — Telephone Encounter (Signed)
Requested medication (s) are due for refill today: yes  Requested medication (s) are on the active medication list: yes  Last refill:  11/22/21 #60 tabs 1 RF  Future visit scheduled: no  Notes to clinic:  med not delegated to NT to reorder    Requested Prescriptions  Pending Prescriptions Disp Refills   clonazePAM (KLONOPIN) 0.5 MG tablet [Pharmacy Med Name: CLONAZEPAM 0.'5MG'$  TABLETS] 60 tablet     Sig: TAKE 1 TABLET(0.5 MG) BY MOUTH TWICE DAILY AS NEEDED FOR ANXIETY     Not Delegated - Psychiatry: Anxiolytics/Hypnotics 2 Failed - 02/03/2022  9:21 AM      Failed - This refill cannot be delegated      Failed - Urine Drug Screen completed in last 360 days      Failed - Valid encounter within last 6 months    Recent Outpatient Visits           11 months ago General medical exam   [name], MD   2 years ago Constipation due to outlet dysfunction   [name], [name], MD   2 years ago General medical exam   [name], MD   2 years ago Cellulitis of left little finger   [name], [name], MD   2 years ago Hypothyroidism, unspecified type   [name], [name], MD              Passed - Patient is not pregnant

## 2022-02-09 ENCOUNTER — Other Ambulatory Visit

## 2022-02-09 DIAGNOSIS — E039 Hypothyroidism, unspecified: Secondary | ICD-10-CM

## 2022-02-09 DIAGNOSIS — E291 Testicular hypofunction: Secondary | ICD-10-CM

## 2022-02-09 DIAGNOSIS — N529 Male erectile dysfunction, unspecified: Secondary | ICD-10-CM

## 2022-02-09 DIAGNOSIS — E1165 Type 2 diabetes mellitus with hyperglycemia: Secondary | ICD-10-CM

## 2022-02-09 DIAGNOSIS — E785 Hyperlipidemia, unspecified: Secondary | ICD-10-CM

## 2022-02-09 DIAGNOSIS — N486 Induration penis plastica: Secondary | ICD-10-CM

## 2022-02-10 ENCOUNTER — Encounter: Payer: Self-pay | Admitting: Family Medicine

## 2022-02-10 LAB — LIPID PANEL
Cholesterol: 155 mg/dL (ref <200)
HDL: 47 mg/dL (ref >=40)
LDL Cholesterol (Calc): 91 mg/dL (calc)
Non-HDL Cholesterol (Calc): 108 mg/dL (calc) (ref <130)
Total CHOL/HDL Ratio: 3.3 (calc) (ref <5.0)
Triglycerides: 81 mg/dL (ref <150)

## 2022-02-10 LAB — CBC WITH DIFFERENTIAL/PLATELET
Absolute Monocytes: 593 cells/uL (ref 200–950)
Basophils Absolute: 104 cells/uL (ref 0–200)
Basophils Relative: 1.5 %
Eosinophils Absolute: 131 cells/uL (ref 15–500)
Eosinophils Relative: 1.9 %
HCT: 45.6 % (ref 38.5–50.0)
Hemoglobin: 16 g/dL (ref 13.2–17.1)
Lymphs Abs: 2884 cells/uL (ref 850–3900)
MCH: 32.1 pg (ref 27.0–33.0)
MCHC: 35.1 g/dL (ref 32.0–36.0)
MCV: 91.6 fL (ref 80.0–100.0)
MPV: 10.2 fL (ref 7.5–12.5)
Monocytes Relative: 8.6 %
Neutro Abs: 3188 cells/uL (ref 1500–7800)
Neutrophils Relative %: 46.2 %
Platelets: 266 10*3/uL (ref 140–400)
RBC: 4.98 10*6/uL (ref 4.20–5.80)
RDW: 11.6 % (ref 11.0–15.0)
Total Lymphocyte: 41.8 %
WBC: 6.9 10*3/uL (ref 3.8–10.8)

## 2022-02-10 LAB — COMPLETE METABOLIC PANEL WITH GFR
AG Ratio: 1.8 (calc) (ref 1.0–2.5)
ALT: 16 U/L (ref 9–46)
AST: 11 U/L (ref 10–35)
Albumin: 4.4 g/dL (ref 3.6–5.1)
Alkaline phosphatase (APISO): 100 U/L (ref 35–144)
BUN/Creatinine Ratio: 20 (calc) (ref 6–22)
BUN: 13 mg/dL (ref 7–25)
CO2: 24 mmol/L (ref 20–32)
Calcium: 9.8 mg/dL (ref 8.6–10.3)
Chloride: 103 mmol/L (ref 98–110)
Creat: 0.64 mg/dL — ABNORMAL LOW (ref 0.70–1.30)
Globulin: 2.5 g/dL (calc) (ref 1.9–3.7)
Glucose, Bld: 154 mg/dL — ABNORMAL HIGH (ref 65–99)
Potassium: 4 mmol/L (ref 3.5–5.3)
Sodium: 141 mmol/L (ref 135–146)
Total Bilirubin: 0.5 mg/dL (ref 0.2–1.2)
Total Protein: 6.9 g/dL (ref 6.1–8.1)
eGFR: 110 mL/min/{1.73_m2} (ref >=60)

## 2022-02-10 LAB — PSA: PSA: 0.35 ng/mL (ref <=4.00)

## 2022-02-10 LAB — HEMOGLOBIN A1C
Hgb A1c MFr Bld: 8.9 % of total Hgb — ABNORMAL HIGH (ref <5.7)
Mean Plasma Glucose: 209 mg/dL
eAG (mmol/L): 11.6 mmol/L

## 2022-02-10 LAB — TSH: TSH: 2.82 mIU/L (ref 0.40–4.50)

## 2022-02-16 ENCOUNTER — Encounter: Payer: Self-pay | Admitting: Family Medicine

## 2022-02-16 ENCOUNTER — Telehealth: Payer: Self-pay

## 2022-02-16 ENCOUNTER — Ambulatory Visit: Admitting: Family Medicine

## 2022-02-16 VITALS — BP 116/62 | HR 70 | Ht 71.0 in | Wt 189.4 lb

## 2022-02-16 DIAGNOSIS — E1165 Type 2 diabetes mellitus with hyperglycemia: Secondary | ICD-10-CM

## 2022-02-16 MED ORDER — FREESTYLE LIBRE 3 READER DEVI: 1.0000 | Freq: Once | 1 refills | Status: AC

## 2022-02-16 MED ORDER — PIOGLITAZONE HCL 30 MG PO TABS: 30.0000 mg | ORAL_TABLET | Freq: Every day | ORAL | 5 refills | Status: DC

## 2022-02-16 MED ORDER — FREESTYLE LIBRE 3 SENSOR MISC: 1.0000 | 11 refills | Status: DC

## 2022-02-16 MED ORDER — LANTUS SOLOSTAR 100 UNIT/ML ~~LOC~~ SOPN: 15.0000 [IU] | PEN_INJECTOR | Freq: Every day | SUBCUTANEOUS | 99 refills | Status: DC

## 2022-02-16 NOTE — Progress Notes (Signed)
Subjective:    Patient ID: Mike [facility], male    DOB: 16-Jun-1965, 57 y.o.   MRN: [id number] Please see labs below.  Patient's most recent A1c is out of control at 8.9 despite taking Trulicity 1.5 mg subcu weekly, Jardiance 25 mg daily, metformin 1000 mg twice daily, and pioglitazone 30 mg daily.  He states that his diet is not that bad.  He does not overindulge in carbohydrates and he exercises daily.  Therefore he is here to discuss his options  Appointment on 02/09/2022  Component Date Value Ref Range Status   TSH 02/09/2022 2.82  0.40 - 4.50 mIU/L Final   PSA 02/09/2022 0.35  < OR = 4.00 ng/mL Final   Comment: The total PSA value from this assay system is  standardized against the WHO standard. The test  result will be approximately 20% lower when compared  to the equimolar-standardized total PSA (Beckman  Coulter). Comparison of serial PSA results should be  interpreted with this fact in mind. . This test was performed using the Siemens  chemiluminescent method. Values obtained from  different assay methods cannot be used interchangeably. PSA levels, regardless of value, should not be interpreted as absolute evidence of the presence or absence of disease.    Cholesterol 02/09/2022 155  <200 mg/dL Final   HDL 02/09/2022 47  > OR = 40 mg/dL Final   Triglycerides 02/09/2022 81  <150 mg/dL Final   LDL Cholesterol (Calc) 02/09/2022 91  mg/dL (calc) Final   Comment: Reference range: <100 . Desirable range <100 mg/dL for primary prevention;   <70 mg/dL for patients with CHD or diabetic patients  with > or = 2 CHD risk factors. Marland Kitchen LDL-C is now calculated using the Martin-Hopkins  calculation, which is a validated novel method providing  better accuracy than the Friedewald equation in the  estimation of LDL-C.  Cresenciano Genre et al. Annamaria Helling. 8469;629(52): 2061-2068  (http://education.[facility].com/faq/FAQ164)    Total CHOL/HDL Ratio 02/09/2022 3.3  <5.0 (calc) Final    Non-HDL Cholesterol (Calc) 02/09/2022 108  <130 mg/dL (calc) Final   Comment: For patients with diabetes plus 1 major ASCVD risk  factor, treating to a non-HDL-C goal of <100 mg/dL  (LDL-C of <70 mg/dL) is considered a therapeutic  option.    Hgb A1c MFr Bld 02/09/2022 8.9 (H)  <5.7 % of total Hgb Final   Comment: For someone without known diabetes, a hemoglobin A1c value of 6.5% or greater indicates that they may have  diabetes and this should be confirmed with a follow-up  test. . For someone with known diabetes, a value <7% indicates  that their diabetes is well controlled and a value  greater than or equal to 7% indicates suboptimal  control. A1c targets should be individualized based on  duration of diabetes, age, comorbid conditions, and  other considerations. . Currently, no consensus exists regarding use of hemoglobin A1c for diagnosis of diabetes for children. .    Mean Plasma Glucose 02/09/2022 209  mg/dL Final   eAG (mmol/L) 02/09/2022 11.6  mmol/L Final   Comment: . HbA1c performed on Roche platform. Effective 11/07/21 a change in test platforms may have  shifted HbA1c results compared to historical results.    Glucose, Bld 02/09/2022 154 (H)  65 - 99 mg/dL Final   Comment: .            Fasting reference interval . For someone without known diabetes, a glucose value >125 mg/dL indicates that they may have diabetes  and this should be confirmed with a follow-up test. .    BUN 02/09/2022 13  7 - 25 mg/dL Final   Creat 02/09/2022 0.64 (L)  0.70 - 1.30 mg/dL Final   eGFR 02/09/2022 110  > OR = 60 mL/min/1.63m Final   BUN/Creatinine Ratio 02/09/2022 20  6 - 22 (calc) Final   Sodium 02/09/2022 141  135 - 146 mmol/L Final   Potassium 02/09/2022 4.0  3.5 - 5.3 mmol/L Final   Chloride 02/09/2022 103  98 - 110 mmol/L Final   CO2 02/09/2022 24  20 - 32 mmol/L Final   Calcium 02/09/2022 9.8  8.6 - 10.3 mg/dL Final   Total Protein 02/09/2022 6.9  6.1 - 8.1 g/dL Final    Albumin 02/09/2022 4.4  3.6 - 5.1 g/dL Final   Globulin 02/09/2022 2.5  1.9 - 3.7 g/dL (calc) Final   AG Ratio 02/09/2022 1.8  1.0 - 2.5 (calc) Final   Total Bilirubin 02/09/2022 0.5  0.2 - 1.2 mg/dL Final   Alkaline phosphatase (APISO) 02/09/2022 100  35 - 144 U/L Final   AST 02/09/2022 11  10 - 35 U/L Final   ALT 02/09/2022 16  9 - 46 U/L Final   WBC 02/09/2022 6.9  3.8 - 10.8 Thousand/uL Final   RBC 02/09/2022 4.98  4.20 - 5.80 Million/uL Final   Hemoglobin 02/09/2022 16.0  13.2 - 17.1 g/dL Final   HCT 02/09/2022 45.6  38.5 - 50.0 % Final   MCV 02/09/2022 91.6  80.0 - 100.0 fL Final   MCH 02/09/2022 32.1  27.0 - 33.0 pg Final   MCHC 02/09/2022 35.1  32.0 - 36.0 g/dL Final   RDW 02/09/2022 11.6  11.0 - 15.0 % Final   Platelets 02/09/2022 266  140 - 400 Thousand/uL Final   MPV 02/09/2022 10.2  7.5 - 12.5 fL Final   Neutro Abs 02/09/2022 3,188  1,500 - 7,800 cells/uL Final   Lymphs Abs 02/09/2022 2,884  850 - 3,900 cells/uL Final   Absolute Monocytes 02/09/2022 593  200 - 950 cells/uL Final   Eosinophils Absolute 02/09/2022 131  15 - 500 cells/uL Final   Basophils Absolute 02/09/2022 104  0 - 200 cells/uL Final   Neutrophils Relative % 02/09/2022 46.2  % Final   Total Lymphocyte 02/09/2022 41.8  % Final   Monocytes Relative 02/09/2022 8.6  % Final   Eosinophils Relative 02/09/2022 1.9  % Final   Basophils Relative 02/09/2022 1.5  % Final      Past Medical History:  Diagnosis Date   Anxiety    Panic attacks   Arthritis    Blood transfusion without reported diagnosis    Chronic prostatitis    Constipation    Depression    Diabetes mellitus without complication ([facility]    ED (erectile dysfunction)    Epididymitis    GERD (gastroesophageal reflux disease)    Hyperlipidemia    Migraines    OSA (obstructive sleep apnea) 02/01/12   SLEEP STUDY [facility]   RBBB    Sleep apnea    uses Cpap   Thyroid disease    hypothyroid   Past Surgical History:   Procedure Laterality Date   ANAL RECTAL MANOMETRY N/A 03/14/2019   Procedure: ANO RECTAL MANOMETRY;  Surgeon: DDoran Stabler MD;  [facility];  Service: Gastroenterology;  Laterality: N/A;   [place]   SHOULDER ARTHROSCOPY  09/2005   left   SHOULDER ARTHROSCOPY  01/5398   right   UMBILICAL HERNIA REPAIR  01/2009   UVULOPALATOPHARYNGOPLASTY  08/2001   [facility] Medications on File Prior to Visit  Medication Sig Dispense Refill   atorvastatin (LIPITOR) 40 MG tablet TAKE 1 TABLET DAILY 90 tablet 3   Blood Glucose Monitoring Suppl (FREESTYLE LITE) DEVI Check BS bid E11.9 200 each 3   brexpiprazole (REXULTI) 1 MG TABS tablet Take 1 tablet (1 mg total) by mouth daily. 30 tablet 5   clonazePAM (KLONOPIN) 0.5 MG tablet TAKE 1 TABLET(0.5 MG) BY MOUTH TWICE DAILY AS NEEDED FOR ANXIETY 60 tablet 1   glucose blood (FREESTYLE LITE) test strip Check BS bid E11.9 200 each 3   JARDIANCE 25 MG TABS tablet TAKE 1 TABLET DAILY BEFORE BREAKFAST 90 tablet 3   Lancets (FREESTYLE) lancets USE TO CHECK BLOOD SUGAR TWICE DAILY 100 each 6   Lancets Misc. (ACCU-CHEK FASTCLIX LANCET) KIT Use to check BS bid DX: E11.9 1 kit 1   lansoprazole (PREVACID) 30 MG capsule TAKE 1 CAPSULE DAILY BEFORE A MEAL 90 capsule 3   levothyroxine (SYNTHROID) 88 MCG tablet TAKE 1 TABLET DAILY 90 tablet 3   metFORMIN (GLUCOPHAGE) 500 MG tablet TAKE 2 TABLETS TWICE A DAY WITH MEALS 360 tablet 1   Multiple Vitamin (MULTIVITAMIN) tablet Take 1 tablet by mouth daily.     pioglitazone (ACTOS) 30 MG tablet Take 1 tablet (30 mg total) by mouth daily. 30 tablet 5   TRINTELLIX 20 MG TABS tablet TAKE 1 TABLET DAILY 90 tablet 3   TRULICITY 1.5 QQ/7.6PP SOPN INJECT 1.5 MG UNDER THE SKIN ONCE A WEEK 6 mL 3   UNABLE TO FIND Med Name:CPAP THERAPHY     vardenafil (LEVITRA) 20 MG tablet Take 20 mg by mouth daily as needed.     No [facility]  facility-administered medications on file prior to visit.   No Known Allergies Social History   Socioeconomic History   Marital status: Married    Spouse name: Not on file   Number of children: 1   Years of education: Not on file   Highest education level: Not on file  Occupational History   Not on file  Tobacco Use   Smoking status: Former    Types: Cigars   Smokeless tobacco: Never   Tobacco comments:    social  Vaping Use   Vaping Use: Never used  Substance and Sexual Activity   Alcohol use: Yes    Alcohol/week: 2.0 standard drinks of alcohol    Types: 2 Standard drinks or equivalent per week    Comment: socail 1 drink per week per pt   Drug use: No   Sexual activity: Not on file  Other Topics Concern   Not on file  Social History Narrative   Not on file   Social Determinants of Health   Financial Resource Strain: Not on file  Food Insecurity: Not on file  Transportation Needs: Not on file  Physical Activity: Not on file  Stress: Not on file  Social Connections: Not on file  Intimate Partner Violence: Not on file      Review of Systems  All other systems reviewed and are negative.      Objective:   Physical Exam Vitals reviewed.  Constitutional:      General: He is not in acute distress.    Appearance: Normal appearance. He is well-developed and normal weight. He  is not ill-appearing, toxic-appearing or diaphoretic.  HENT:     Head: Normocephalic and atraumatic.  Eyes:     General: No scleral icterus. Neck:     Vascular: No JVD.  Cardiovascular:     Rate and Rhythm: Normal rate and regular rhythm.     Heart sounds: Normal heart sounds. No murmur heard.    No friction rub. No gallop.  Pulmonary:     Effort: Pulmonary effort is normal. No respiratory distress.     Breath sounds: Normal breath sounds. No stridor. No wheezing, rhonchi or rales.  Chest:     Chest wall: No tenderness.  Musculoskeletal:     Cervical back: Neck supple.  Skin:     General: Skin is warm.     Coloration: Skin is not jaundiced or pale.     Findings: No bruising, erythema, lesion or rash.  Neurological:     General: No focal deficit present.     Mental Status: He is alert and oriented to person, place, and time. Mental status is at baseline.     Cranial Nerves: No cranial nerve deficit.     Sensory: No sensory deficit.     Motor: No weakness.     Coordination: Coordination normal.     Gait: Gait normal.     Deep Tendon Reflexes: Reflexes normal.  Psychiatric:        Judgment: Judgment normal.           Assessment & Plan:  Uncontrolled type 2 diabetes mellitus with hyperglycemia ([place]) Unfortunately the patient needs to start insulin.  Spent more than 30 minutes today with him discussing insulin.  We are going to start Lantus 15 units subcu daily and increase 1 unit daily until his fasting blood sugars under 130.  Then we will look at his 2-hour postprandial sugars to see how well-controlled.  His goal fasting blood sugars less than 130 and his goal 2-hour postprandial sugars less than 160.  Once we have his sugars under control we may try to wean the patient off metformin and Jardiance but continue pioglitazone and Trulicity as insulin sensitizer is.  Start the patient on continuous blood glucose monitoring.  Recommended a low carbohydrate diet.  Recheck in 1 month

## 2022-02-16 NOTE — Telephone Encounter (Signed)
Mike Henderson (Key: [id number])  [employer] is reviewing your PA request and will respond within 24 hours for Medicaid or up to 72 hours for non-Medicaid plans, based on the required timeframe determined by state or federal regulations. To check for an update later, open this request from your dashboard.

## 2022-02-20 NOTE — Telephone Encounter (Signed)
PA-Libre 3 Sensor was denied

## 2022-02-21 ENCOUNTER — Encounter: Payer: Self-pay | Admitting: Family Medicine

## 2022-02-27 ENCOUNTER — Other Ambulatory Visit: Payer: Self-pay | Admitting: Family Medicine

## 2022-02-28 NOTE — Telephone Encounter (Signed)
Requested Prescriptions  Pending Prescriptions Disp Refills   TRULICITY 1.5 XB/9.3JQ SOPN [Pharmacy Med Name: TRULICITY SD PEN 3.0SP 4'S 1.'5MG'$  1.'5MG'$ ] 6 mL 0    Sig: INJECT 1.5 MG UNDER THE SKIN ONCE A WEEK     Endocrinology:  Diabetes - GLP-1 Receptor Agonists Failed - 02/27/2022 10:16 AM      Failed - HBA1C is between 0 and 7.9 and within 180 days    Hgb A1c MFr Bld  Date Value Ref Range Status  02/09/2022 8.9 (H) <5.7 % of total Hgb Final    Comment:    For someone without known diabetes, a hemoglobin A1c value of 6.5% or greater indicates that they may have  diabetes and this should be confirmed with a follow-up  test. . For someone with known diabetes, a value <7% indicates  that their diabetes is well controlled and a value  greater than or equal to 7% indicates suboptimal  control. A1c targets should be individualized based on  duration of diabetes, age, comorbid conditions, and  other considerations. . Currently, no consensus exists regarding use of hemoglobin A1c for diagnosis of diabetes for children. .          Failed - Valid encounter within last 6 months    Recent Outpatient Visits           1 year ago General medical exam   [place] Susy Frizzle, MD   2 years ago Constipation due to outlet dysfunction   [place] Pickard, [name], MD   2 years ago General medical exam   [place] Susy Frizzle, MD   2 years ago Cellulitis of left little finger   [place] Schaumann, [name], MD   2 years ago Hypothyroidism, unspecified type   [place] Pickard, [name], MD       Future Appointments             In 2 weeks Pickard, [name], MD [facility]             atorvastatin (LIPITOR) 40 MG tablet [Pharmacy Med Name: ATORVASTATIN TABS '40MG'$ ] 90 tablet 0    Sig: TAKE 1 TABLET DAILY     Cardiovascular:  Antilipid - Statins  Failed - 02/27/2022 10:16 AM      Failed - Valid encounter within last 12 months    Recent Outpatient Visits           1 year ago General medical exam   [place] Susy Frizzle, MD   2 years ago Constipation due to outlet dysfunction   [place] Pickard, [name], MD   2 years ago General medical exam   [place] Susy Frizzle, MD   2 years ago Cellulitis of left little finger   [place] Dennard Schaumann, [name], MD   2 years ago Hypothyroidism, unspecified type   [place], [name], MD       Future Appointments             In 2 weeks [place] Schaumann, [name], MD [facility]            Failed - Lipid Panel in normal range within the last 12 months    Cholesterol  Date Value Ref Range Status  02/09/2022 155 <200 mg/dL Final  LDL Cholesterol (Calc)  Date Value Ref Range Status  02/09/2022 91 mg/dL (calc) Final    Comment:    Reference range: <100 . Desirable range <100 mg/dL for primary prevention;   <70 mg/dL for patients with CHD or diabetic patients  with > or = 2 CHD risk factors. Marland Kitchen LDL-C is now calculated using the Martin-Hopkins  calculation, which is a validated novel method providing  better accuracy than the Friedewald equation in the  estimation of LDL-C.  Cresenciano Genre et al. Annamaria Helling. 5462;703(50): 2061-2068  (http://education.[facility].com/faq/FAQ164)    HDL  Date Value Ref Range Status  02/09/2022 47 > OR = 40 mg/dL Final   Triglycerides  Date Value Ref Range Status  02/09/2022 81 <150 mg/dL Final         Passed - Patient is not pregnant       TRINTELLIX 20 MG TABS tablet [Pharmacy Med Name: TRINTELLIX TABS '20MG'$ ] 90 tablet 0    Sig: TAKE 1 TABLET DAILY     Psychiatry: Antidepressants - Serotonin Modulator Failed - 02/27/2022 10:16 AM      Failed - Valid encounter within last 6 months    Recent Outpatient  Visits           1 year ago General medical exam   [place] Susy Frizzle, MD   2 years ago Constipation due to outlet dysfunction   [place] Pickard, [name], MD   2 years ago General medical exam   [place] Susy Frizzle, MD   2 years ago Cellulitis of left little finger   [place] Dennard Schaumann, [name], MD   2 years ago Hypothyroidism, unspecified type   [place], [name], MD       Future Appointments             In 2 weeks Pickard, [name], MD [facility]            Passed - Completed PHQ-2 or PHQ-9 in the last 360 days

## 2022-03-14 ENCOUNTER — Encounter: Payer: Self-pay | Admitting: Family Medicine

## 2022-03-14 ENCOUNTER — Other Ambulatory Visit: Payer: Self-pay

## 2022-03-14 DIAGNOSIS — E1165 Type 2 diabetes mellitus with hyperglycemia: Secondary | ICD-10-CM

## 2022-03-14 MED ORDER — FREESTYLE LITE TEST VI STRP: ORAL_STRIP | 3 refills | Status: DC

## 2022-03-17 ENCOUNTER — Ambulatory Visit: Admitting: Family Medicine

## 2022-03-20 ENCOUNTER — Ambulatory Visit: Admitting: Family Medicine

## 2022-03-20 ENCOUNTER — Encounter: Payer: Self-pay | Admitting: Family Medicine

## 2022-03-20 ENCOUNTER — Other Ambulatory Visit: Payer: Self-pay | Admitting: Family Medicine

## 2022-03-20 VITALS — BP 118/72 | HR 73 | Temp 97.8°F | Ht 71.0 in | Wt 198.6 lb

## 2022-03-20 DIAGNOSIS — E1165 Type 2 diabetes mellitus with hyperglycemia: Secondary | ICD-10-CM

## 2022-03-20 MED ORDER — SEMAGLUTIDE (2 MG/DOSE) 8 MG/3ML ~~LOC~~ SOPN: 2.0000 mg | PEN_INJECTOR | SUBCUTANEOUS | 3 refills | Status: DC

## 2022-03-20 MED ORDER — INSULIN ASPART 100 UNIT/ML IJ SOLN: 2.0000 [IU] | Freq: Three times a day (TID) | INTRAMUSCULAR | 11 refills | Status: AC | PRN

## 2022-03-20 NOTE — Progress Notes (Signed)
Subjective:    Patient ID: Mike [facility], male    DOB: 1965/06/13, 57 y.o.   MRN: [id number] 02/16/22 Patient's most recent A1c is out of control at 8.9 despite taking Trulicity 1.5 mg subcu weekly, Jardiance 25 mg daily, metformin 1000 mg twice daily, and pioglitazone 30 mg daily.  He states that his diet is not that bad.  He does not overindulge in carbohydrates and he exercises daily.  Therefore he is here to discuss his options.  At that time, my plan was: Unfortunately the patient needs to start insulin.  Spent more than 30 minutes today with him discussing insulin.  We are going to start Lantus 15 units subcu daily and increase 1 unit daily until his fasting blood sugars under 130.  Then we will look at his 2-hour postprandial sugars to see how well-controlled.  His goal fasting blood sugars less than 130 and his goal 2-hour postprandial sugars less than 160.  Once we have his sugars under control we may try to wean the patient off metformin and Jardiance but continue pioglitazone and Trulicity as insulin sensitizer is.  Start the patient on continuous blood glucose monitoring.  Recommended a low carbohydrate diet.  Recheck in 1 month  03/20/22  Patient's blood sugars in the morning are typically between 80 and 140.  He is currently on 32 units of Lantus.  He denies any hypoglycemic episodes.  Unfortunately his insurance would not cover the continuous blood glucose monitor despite being on insulin.    Past Medical History:  Diagnosis Date   Anxiety    Panic attacks   Arthritis    Blood transfusion without reported diagnosis    Chronic prostatitis    Constipation    Depression    Diabetes mellitus without complication (HCC)    ED (erectile dysfunction)    Epididymitis    GERD (gastroesophageal reflux disease)    Hyperlipidemia    Migraines    OSA (obstructive sleep apnea) 02/01/12   SLEEP STUDY [facility]   RBBB    Sleep apnea    uses Cpap   Thyroid  disease    hypothyroid   Past Surgical History:  Procedure Laterality Date   ANAL RECTAL MANOMETRY N/A 03/14/2019   Procedure: ANO RECTAL MANOMETRY;  Surgeon: Doran Stabler, MD;  Location: [facility];  Service: Gastroenterology;  Laterality: N/A;   COLONOSCOPY  2001   normal per pt. in [place] Bilateral    for tennis elbow   SHOULDER ARTHROSCOPY  09/2005   left   SHOULDER ARTHROSCOPY  [id number]   right   UMBILICAL HERNIA REPAIR  01/2009   UVULOPALATOPHARYNGOPLASTY  08/2001   Current Outpatient Medications on File Prior to Visit  Medication Sig Dispense Refill   atorvastatin (LIPITOR) 40 MG tablet TAKE 1 TABLET DAILY 90 tablet 0   Blood Glucose Monitoring Suppl (FREESTYLE LITE) DEVI Check BS bid E11.9 200 each 3   brexpiprazole (REXULTI) 1 MG TABS tablet Take 1 tablet (1 mg total) by mouth daily. 30 tablet 5   clonazePAM (KLONOPIN) 0.5 MG tablet TAKE 1 TABLET(0.5 MG) BY MOUTH TWICE DAILY AS NEEDED FOR ANXIETY 60 tablet 1   Continuous Blood Gluc Sensor (FREESTYLE LIBRE 3 SENSOR) MISC 1 Device by Does not apply route every 14 (fourteen) days. Place 1 sensor on the skin every 14 days. Use to check glucose continuously 2 each 11   Dulaglutide (TRULICITY) 1.5 0000000 SOPN INJECT 1.5 MG UNDER  THE SKIN ONCE A WEEK 6 mL 0   glucose blood (FREESTYLE LITE) test strip Use to check blood sugars 4 times per day. 200 each 3   insulin glargine (LANTUS SOLOSTAR) 100 UNIT/ML Solostar Pen Inject 15 Units into the skin daily. 15 mL PRN   JARDIANCE 25 MG TABS tablet TAKE 1 TABLET DAILY BEFORE BREAKFAST 90 tablet 3   Lancets (FREESTYLE) lancets USE TO CHECK BLOOD SUGAR TWICE DAILY 100 each 6   Lancets Misc. (ACCU-CHEK FASTCLIX LANCET) KIT Use to check BS bid DX: E11.9 1 kit 1   lansoprazole (PREVACID) 30 MG capsule TAKE 1 CAPSULE DAILY BEFORE A MEAL 90 capsule 3   levothyroxine (SYNTHROID) 88 MCG tablet TAKE 1 TABLET DAILY 90 tablet 3   metFORMIN (GLUCOPHAGE) 500 MG tablet TAKE 2 TABLETS  TWICE A DAY WITH MEALS 360 tablet 1   Multiple Vitamin (MULTIVITAMIN) tablet Take 1 tablet by mouth daily.     pioglitazone (ACTOS) 30 MG tablet Take 1 tablet (30 mg total) by mouth daily. 30 tablet 5   UNABLE TO FIND Med Name:CPAP THERAPHY     vardenafil (LEVITRA) 20 MG tablet Take 20 mg by mouth daily as needed.     vortioxetine HBr (TRINTELLIX) 20 MG TABS tablet TAKE 1 TABLET DAILY 90 tablet 0   No current facility-administered medications on file prior to visit.   No Known Allergies Social History   Socioeconomic History   Marital status: Married    Spouse name: Not on file   Number of children: 1   Years of education: Not on file   Highest education level: Not on file  Occupational History   Not on file  Tobacco Use   Smoking status: Former    Types: Cigars   Smokeless tobacco: Never   Tobacco comments:    social  Vaping Use   Vaping Use: Never used  Substance and Sexual Activity   Alcohol use: Yes    Alcohol/week: 2.0 standard drinks of alcohol    Types: 2 Standard drinks or equivalent per week    Comment: socail 1 drink per week per pt   Drug use: No   Sexual activity: Not on file  Other Topics Concern   Not on file  Social History Narrative   Not on file   Social Determinants of Health   Financial Resource Strain: Not on file  Food Insecurity: Not on file  Transportation Needs: Not on file  Physical Activity: Not on file  Stress: Not on file  Social Connections: Not on file  Intimate Partner Violence: Not on file      Review of Systems  All other systems reviewed and are negative.      Objective:   Physical Exam Vitals reviewed.  Constitutional:      General: He is not in acute distress.    Appearance: Normal appearance. He is well-developed and normal weight. He is not ill-appearing, toxic-appearing or diaphoretic.  HENT:     Head: Normocephalic and atraumatic.  Eyes:     General: No scleral icterus. Neck:     Vascular: No JVD.   Cardiovascular:     Rate and Rhythm: Normal rate and regular rhythm.     Heart sounds: Normal heart sounds. No murmur heard.    No friction rub. No gallop.  Pulmonary:     Effort: Pulmonary effort is normal. No respiratory distress.     Breath sounds: Normal breath sounds. No stridor. No wheezing, rhonchi or rales.  Chest:  Chest wall: No tenderness.  Musculoskeletal:     Cervical back: Neck supple.  Skin:    General: Skin is warm.     Coloration: Skin is not jaundiced or pale.     Findings: No bruising, erythema, lesion or rash.  Neurological:     General: No focal deficit present.     Mental Status: He is alert and oriented to person, place, and time. Mental status is at baseline.     Cranial Nerves: No cranial nerve deficit.     Sensory: No sensory deficit.     Motor: No weakness.     Coordination: Coordination normal.     Gait: Gait normal.     Deep Tendon Reflexes: Reflexes normal.  Psychiatric:        Judgment: Judgment normal.           Assessment & Plan:  Uncontrolled type 2 diabetes mellitus with hyperglycemia (HCC) Fasting blood sugars now sound well-controlled.  Continue Lantus 32 units daily.  However I will order NovoLog at mealtime based on a sliding scale.  He will administer 2 units of NovoLog for every 50 points above 150.  For instance blood sugar between 150 and 200 he would administer 2 units, 201 to 250-4 units, and so on.  Recheck lab work in 3 months.  Ideally I like him to be on continuous blood glucose monitoring with time in range greater than 75%.

## 2022-03-21 ENCOUNTER — Other Ambulatory Visit: Payer: Self-pay | Admitting: Family Medicine

## 2022-03-21 MED ORDER — SEMAGLUTIDE (2 MG/DOSE) 8 MG/3ML ~~LOC~~ SOPN: 2.0000 mg | PEN_INJECTOR | SUBCUTANEOUS | 3 refills | Status: DC

## 2022-03-22 ENCOUNTER — Telehealth: Payer: Self-pay

## 2022-03-22 NOTE — Telephone Encounter (Signed)
Ozempic has been approved. Case # [id number]. Approval is from 02/20/2022-01/29/2098.  Pt advised via My Chart Message. Mjp,lpn

## 2022-03-28 ENCOUNTER — Encounter: Payer: Self-pay | Admitting: Family Medicine

## 2022-03-28 ENCOUNTER — Other Ambulatory Visit: Payer: Self-pay | Admitting: Family Medicine

## 2022-03-28 MED ORDER — LANTUS SOLOSTAR 100 UNIT/ML ~~LOC~~ SOPN: 15.0000 [IU] | PEN_INJECTOR | Freq: Every day | SUBCUTANEOUS | 1 refills | Status: DC

## 2022-03-28 NOTE — Telephone Encounter (Signed)
Prescription Request  03/28/2022  Is this a "Controlled Substance" medicine? No  LOV: 03/20/2022  What is the name of the medication or equipment?   insulin glargine (LANTUS SOLOSTAR) 100 UNIT/ML Solostar Pen   Have you contacted your pharmacy to request a refill? Yes   Which pharmacy would you like this sent to?  [facility] - [address], [place] [address] Phone: [phone] Fax: [phone]  [facility] [place], [place] [address] [place] [address] Phone: [phone] Fax: [phone]    Patient notified that their request is being sent to the clinical staff for review and that they should receive a response within 2 business days.   Please advise pharmacist.

## 2022-03-28 NOTE — Telephone Encounter (Signed)
Requested Prescriptions  Pending Prescriptions Disp Refills   insulin glargine (LANTUS SOLOSTAR) 100 UNIT/ML Solostar Pen 15 mL 0    Sig: Inject 15 Units into the skin daily.     Endocrinology:  Diabetes - Insulins Failed - 03/28/2022 10:02 AM      Failed - HBA1C is between 0 and 7.9 and within 180 days    Hgb A1c MFr Bld  Date Value Ref Range Status  02/09/2022 8.9 (H) <5.7 % of total Hgb Final    Comment:    For someone without known diabetes, a hemoglobin A1c value of 6.5% or greater indicates that they may have  diabetes and this should be confirmed with a follow-up  test. . For someone with known diabetes, a value <7% indicates  that their diabetes is well controlled and a value  greater than or equal to 7% indicates suboptimal  control. A1c targets should be individualized based on  duration of diabetes, age, comorbid conditions, and  other considerations. . Currently, no consensus exists regarding use of hemoglobin A1c for diagnosis of diabetes for children. .          Failed - Valid encounter within last 6 months    Recent Outpatient Visits           1 year ago General medical exam   [place] Susy Frizzle, MD   2 years ago Constipation due to outlet dysfunction   [place] Pickard, [name], MD   2 years ago General medical exam   [place] Susy Frizzle, MD   2 years ago Cellulitis of left little finger   [place] Schaumann, [name], MD   2 years ago Hypothyroidism, unspecified type   [place] Pickard, [name], MD

## 2022-04-07 ENCOUNTER — Other Ambulatory Visit: Payer: Self-pay | Admitting: Family Medicine

## 2022-04-07 DIAGNOSIS — F419 Anxiety disorder, unspecified: Secondary | ICD-10-CM

## 2022-04-10 ENCOUNTER — Encounter: Payer: Self-pay | Admitting: Family Medicine

## 2022-04-10 NOTE — Telephone Encounter (Signed)
Requested medication (s) are due for refill today: yes  Requested medication (s) are on the active medication list: yes  Last refill:  02/03/22  Future visit scheduled: no  Notes to clinic:  Unable to refill per protocol, cannot delegate.      Requested Prescriptions  Pending Prescriptions Disp Refills   clonazePAM (KLONOPIN) 0.5 MG tablet [Pharmacy Med Name: CLONAZEPAM 0.'5MG'$  TABLETS] 60 tablet     Sig: TAKE 1 TABLET(0.5 MG) BY MOUTH TWICE DAILY AS NEEDED FOR ANXIETY     Not Delegated - Psychiatry: Anxiolytics/Hypnotics 2 Failed - 04/07/2022  8:36 PM      Failed - This refill cannot be delegated      Failed - Urine Drug Screen completed in last 360 days      Failed - Valid encounter within last 6 months    Recent Outpatient Visits           1 year ago General medical exam   [place] Susy Frizzle, MD   2 years ago Constipation due to outlet dysfunction   [place] Pickard, [name], MD   2 years ago General medical exam   [place] Susy Frizzle, MD   2 years ago Cellulitis of left little finger   [place] Schaumann, [name], MD   2 years ago Hypothyroidism, unspecified type   [place], [name], MD              Passed - Patient is not pregnant

## 2022-04-11 ENCOUNTER — Encounter: Payer: Self-pay | Admitting: Family Medicine

## 2022-05-09 ENCOUNTER — Other Ambulatory Visit: Payer: Self-pay | Admitting: Family Medicine

## 2022-05-09 ENCOUNTER — Telehealth: Payer: Self-pay

## 2022-05-09 ENCOUNTER — Other Ambulatory Visit: Payer: Self-pay

## 2022-05-09 DIAGNOSIS — E1165 Type 2 diabetes mellitus with hyperglycemia: Secondary | ICD-10-CM

## 2022-05-09 MED ORDER — FREESTYLE LITE TEST VI STRP
ORAL_STRIP | 3 refills | Status: AC
Start: 2022-05-09 — End: ?

## 2022-05-09 NOTE — Telephone Encounter (Signed)
Requesting new 90 day prescription  Prescription Request  05/09/2022  LOV: 03/20/22  What is the name of the medication or equipment? glucose blood (FREESTYLE LITE) test strip [[id number]]  Have you contacted your pharmacy to request a refill? Yes   Which pharmacy would you like this sent to?  [facility] - [place] - [address] [place] [address] Phone: [phone] Fax: [phone]  [facility] - [place], [place] - [address] [place] [address] Phone: [phone] Fax: [phone]    Patient notified that their request is being sent to the clinical staff for review and that they should receive a response within 2 business days.   Please advise at [facility] [phone]

## 2022-05-19 ENCOUNTER — Telehealth: Payer: Self-pay

## 2022-05-19 NOTE — Telephone Encounter (Signed)
PA for Freestyle Lite Test Strips approved. Mjp,lpn

## 2022-05-22 ENCOUNTER — Ambulatory Visit: Admitting: Family Medicine

## 2022-05-22 ENCOUNTER — Encounter: Payer: Self-pay | Admitting: Family Medicine

## 2022-05-22 VITALS — BP 124/76 | HR 68 | Ht 71.0 in | Wt 193.4 lb

## 2022-05-22 DIAGNOSIS — F331 Major depressive disorder, recurrent, moderate: Secondary | ICD-10-CM | POA: Diagnosis not present

## 2022-05-22 MED ORDER — OLANZAPINE 5 MG PO TABS: 5.0000 mg | ORAL_TABLET | Freq: Every day | ORAL | 3 refills | Status: DC

## 2022-05-22 MED ORDER — DIAZEPAM 5 MG PO TABS: 5.0000 mg | ORAL_TABLET | Freq: Two times a day (BID) | ORAL | 1 refills | Status: AC | PRN

## 2022-05-22 NOTE — Progress Notes (Signed)
Subjective:    Patient ID: Mike Henderson, male    DOB: Mar 02, 1965, 57 y.o.   MRN: [id number] "patient is a very pleasant 57 year old Caucasian gentleman here today for complete physical exam.  He has been battling severe depression.  He has a longstanding history of depression and has been on numerous antidepressants since the 1990s.  These include Zoloft, Prozac, venlafaxine, and ultimately he is on Trintellix at the present time.  Despite being on Trintellix 20 mg a day he still reports anhedonia.  He reports feeling pessimistic about the future.  He reports feeling sad for no reason.  He is having trouble sleeping.  He feels anxious.  He denies any suicidal thoughts however the medications are just not effectively controlling his depression"  The above exerpt is from his physical exam in January.  At that time we added Vraylar.  Patient states that on Vraylar, he saw benefit with regards to managing his depression.  However he developed movement issues.  He reported a restless feeling in his legs.  He was having some uncontrolled muscle movements.  All of this was problematic for him.  Therefore he discontinued the medication and the restless feeling in his legs and the muscle movements improved.  However the depression and anxiety has spiraled out of control is gotten much worse.  He states that he has very little energy.  His appetite has been affected.  He is having trouble sleeping.  The Klonopin is no longer effective for him.  He does not endorse suicidal thoughts.    05/22/22 Ultimately we started the patient on Rexulti and he is currently taking 2 mg a day.  He continues to endorse severe depression.  He has had thoughts that he would be better off not being here.  However he denies having a plan or any decision to act on that.  He states that he would never do something like that to her his wife or his daughter or his grandchildren.  However he is having trouble sleeping.  Every day at  work he feels like he is going to have a breakdown.  He does not feel like he can relax enough to even work.  The Klonopin is not touching his anxiety.  He is having no effect on him.  He feels hopeless.  He reports anhedonia.  He reports feeling anxious.  He reports severe insomnia.  He denies any hallucinations or delusions.  Past Medical History:  Diagnosis Date   Anxiety    Panic attacks   Arthritis    Blood transfusion without reported diagnosis    Chronic prostatitis    Constipation    Depression    Diabetes mellitus without complication    ED (erectile dysfunction)    Epididymitis    GERD (gastroesophageal reflux disease)    Hyperlipidemia    Migraines    OSA (obstructive sleep apnea) 02/01/12   SLEEP STUDY [facility]   RBBB    Sleep apnea    uses Cpap   Thyroid disease    hypothyroid   Past Surgical History:  Procedure Laterality Date   ANAL RECTAL MANOMETRY N/A 03/14/2019   Procedure: ANO RECTAL MANOMETRY;  Surgeon: Sherrilyn Rist, MD;  Location: [facility];  Service: Gastroenterology;  Laterality: N/A;   COLONOSCOPY  2001   normal [facility] pt. in [employer]   ELBOW SURGERY Bilateral    for tennis elbow   SHOULDER ARTHROSCOPY  09/2005   left  SHOULDER ARTHROSCOPY  01/2006   right   UMBILICAL HERNIA REPAIR  01/2009   UVULOPALATOPHARYNGOPLASTY  08/2001   Current Outpatient Medications on File Prior to Visit  Medication Sig Dispense Refill   atorvastatin (LIPITOR) 40 MG tablet TAKE 1 TABLET DAILY 90 tablet 0   Blood Glucose Monitoring Suppl (FREESTYLE LITE) DEVI Check BS bid E11.9 200 each 3   brexpiprazole (REXULTI) 1 MG TABS tablet Take 1 tablet (1 mg total) by mouth daily. 30 tablet 5   clonazePAM (KLONOPIN) 0.5 MG tablet TAKE 1 TABLET(0.5 MG) BY MOUTH TWICE DAILY AS NEEDED FOR ANXIETY 60 tablet 2   Continuous Blood Gluc Sensor (FREESTYLE LIBRE 3 SENSOR) MISC 1 Device by Does not apply route every 14 (fourteen) days. Place 1 sensor on the skin  every 14 days. Use to check glucose continuously 2 each 11   empagliflozin (JARDIANCE) 25 MG TABS tablet TAKE 1 TABLET DAILY BEFORE BREAKFAST 90 tablet 0   glucose blood (FREESTYLE LITE) test strip Use to check blood sugars 4 times per day. 200 each 3   insulin aspart (NOVOLOG) 100 UNIT/ML injection Inject 2 Units into the skin 3 (three) times daily as needed for high blood sugar. 10 mL 11   insulin glargine (LANTUS SOLOSTAR) 100 UNIT/ML Solostar Pen Inject 15 Units into the skin daily. 15 mL 1   Lancets (FREESTYLE) lancets USE TO CHECK BLOOD SUGAR TWICE DAILY 100 each 6   Lancets Misc. (ACCU-CHEK FASTCLIX LANCET) KIT Use to check BS bid DX: E11.9 1 kit 1   lansoprazole (PREVACID) 30 MG capsule TAKE 1 CAPSULE DAILY BEFORE A MEAL 90 capsule 3   levothyroxine (SYNTHROID) 88 MCG tablet TAKE 1 TABLET DAILY 90 tablet 3   metFORMIN (GLUCOPHAGE) 500 MG tablet TAKE 2 TABLETS TWICE A DAY WITH MEALS 360 tablet 3   Multiple Vitamin (MULTIVITAMIN) tablet Take 1 tablet by mouth daily.     pioglitazone (ACTOS) 30 MG tablet Take 1 tablet (30 mg total) by mouth daily. 30 tablet 5   Semaglutide, 2 MG/DOSE, 8 MG/3ML SOPN Inject 2 mg as directed once a week. Trulicity is unavailable due to national back order 9 mL 3   UNABLE TO FIND Med Name:CPAP THERAPHY     vardenafil (LEVITRA) 20 MG tablet Take 20 mg by mouth daily as needed.     vortioxetine HBr (TRINTELLIX) 20 MG TABS tablet TAKE 1 TABLET DAILY 90 tablet 0   No current facility-administered medications on file prior to visit.   No Known Allergies Social History   Socioeconomic History   Marital status: Married    Spouse name: Not on file   Number of children: 1   Years of education: Not on file   Highest education level: Some college, no degree  Occupational History   Not on file  Tobacco Use   Smoking status: Former    Types: Cigars   Smokeless tobacco: Never   Tobacco comments:    social  Advertising account planner   Vaping Use: Never used  Substance and  Sexual Activity   Alcohol use: Yes    Alcohol/week: 2.0 standard drinks of alcohol    Types: 2 Standard drinks or equivalent per week    Comment: socail 1 drink per week per pt   Drug use: No   Sexual activity: Not on file  Other Topics Concern   Not on file  Social History Narrative   Not on file   Social Determinants of Health   Financial Resource Strain: Low  Risk  (05/18/2022)   Overall Financial Resource Strain (CARDIA)    Difficulty of Paying Living Expenses: Not hard at all  Food Insecurity: No Food Insecurity (05/18/2022)   Hunger Vital Sign    Worried About Running Out of Food in the Last Year: Never true    Ran Out of Food in the Last Year: Never true  Transportation Needs: No Transportation Needs (05/18/2022)   PRAPARE - Administrator, Civil Service (Medical): No    Lack of Transportation (Non-Medical): No  Physical Activity: Unknown (05/18/2022)   Exercise Vital Sign    Days of Exercise per Week: 0 days    Minutes of Exercise per Session: Not on file  Stress: Stress Concern Present (05/18/2022)   [employer] of Occupational Health - Occupational Stress Questionnaire    Feeling of Stress : Very much  Social Connections: Unknown (05/18/2022)   Social Connection and Isolation Panel [NHANES]    Frequency of Communication with Friends and Family: Once a week    Frequency of Social Gatherings with Friends and Family: Once a week    Attends Religious Services: Patient declined    Database administrator or Organizations: Yes    Attends Engineer, structural: More than 4 times per year    Marital Status: Married  Catering manager Violence: Not on file      Review of Systems  All other systems reviewed and are negative.      Objective:   Physical Exam Vitals reviewed.  Constitutional:      General: He is not in acute distress.    Appearance: Normal appearance. He is well-developed and normal weight. He is not ill-appearing, toxic-appearing  or diaphoretic.  HENT:     Head: Normocephalic and atraumatic.  Neck:     Vascular: No JVD.  Cardiovascular:     Rate and Rhythm: Normal rate and regular rhythm.     Heart sounds: Normal heart sounds. No murmur heard.    No friction rub. No gallop.  Pulmonary:     Effort: Pulmonary effort is normal. No respiratory distress.     Breath sounds: Normal breath sounds. No stridor. No wheezing, rhonchi or rales.  Chest:     Chest wall: No tenderness.  Musculoskeletal:     Left shoulder: Tenderness present. Normal strength.  Neurological:     General: No focal deficit present.     Mental Status: He is alert and oriented to person, place, and time.  Psychiatric:        Mood and Affect: Mood normal.        Behavior: Behavior normal.        Thought Content: Thought content normal.        Judgment: Judgment normal.           Assessment & Plan:  Moderate episode of recurrent major depressive disorder Patient has been on Klonopin for 3 years.  I believe he is tolerant to the medication.  I will switch him to Valium 5 mg tablets.  Of asked him to take half a tablet twice a day as needed for anxiety.  He can take a whole tablet twice a day as needed for panic attacks or anxiety if necessary but have asked him to be cautious to avoid sedation and dependency.  I also recommended discontinuation of Rexulti and replacement with Zyprexa 5 mg daily.  Recheck in 2 weeks.  Meanwhile consult psychiatry

## 2022-06-04 ENCOUNTER — Encounter: Payer: Self-pay | Admitting: Family Medicine

## 2022-06-06 ENCOUNTER — Other Ambulatory Visit: Payer: Self-pay | Admitting: Family Medicine

## 2022-07-11 ENCOUNTER — Ambulatory Visit: Admitting: Family Medicine

## 2022-07-17 ENCOUNTER — Encounter: Payer: Self-pay | Admitting: Family Medicine

## 2022-08-03 ENCOUNTER — Other Ambulatory Visit: Payer: Self-pay | Admitting: Family Medicine

## 2022-08-04 NOTE — Telephone Encounter (Signed)
Requested Prescriptions  Pending Prescriptions Disp Refills   pioglitazone (ACTOS) 30 MG tablet [Pharmacy Med Name: PIOGLITAZONE 30MG  TABLETS] 90 tablet 0    Sig: TAKE 1 TABLET(30 MG) BY MOUTH DAILY     Endocrinology:  Diabetes - Glitazones - pioglitazone Failed - 08/03/2022 10:41 AM      Failed - HBA1C is between 0 and 7.9 and within 180 days    Hgb A1c MFr Bld  Date Value Ref Range Status  02/09/2022 8.9 (H) <5.7 % of total Hgb Final    Comment:    For someone without known diabetes, a hemoglobin A1c value of 6.5% or greater indicates that they may have  diabetes and this should be confirmed with a follow-up  test. . For someone with known diabetes, a value <7% indicates  that their diabetes is well controlled and a value  greater than or equal to 7% indicates suboptimal  control. A1c targets should be individualized based on  duration of diabetes, age, comorbid conditions, and  other considerations. . Currently, no consensus exists regarding use of hemoglobin A1c for diagnosis of diabetes for children. .          Failed - Valid encounter within last 6 months    Recent Outpatient Visits           1 year ago General medical exam   [facility] Donita Brooks, MD   2 years ago Constipation due to outlet dysfunction   [facility] Pickard, Priscille Heidelberg, MD   2 years ago General medical exam   [facility] Donita Brooks, MD   3 years ago Cellulitis of left little finger   [facility] Tanya Nones, Priscille Heidelberg, MD   3 years ago Hypothyroidism, unspecified type   [facility] Pickard, Priscille Heidelberg, MD

## 2022-08-09 ENCOUNTER — Other Ambulatory Visit: Payer: Self-pay | Admitting: Family Medicine

## 2022-08-09 NOTE — Telephone Encounter (Signed)
Requested Prescriptions  Pending Prescriptions Disp Refills   JARDIANCE 25 MG TABS tablet [Pharmacy Med Name: JARDIANCE TABS 25MG ] 90 tablet 0    Sig: TAKE 1 TABLET DAILY BEFORE BREAKFAST (NEED COMPLETE PHYSICAL EXAM FOR FURTHER REFILLS, COURTESY REFILL)     Endocrinology:  Diabetes - SGLT2 Inhibitors Failed - 08/09/2022  5:42 AM      Failed - Cr in normal range and within 360 days    Creat  Date Value Ref Range Status  02/09/2022 0.64 (L) 0.70 - 1.30 mg/dL Final   Creatinine, Urine  Date Value Ref Range Status  08/09/2021 49 20 - 320 mg/dL Final         Failed - HBA1C is between 0 and 7.9 and within 180 days    Hgb A1c MFr Bld  Date Value Ref Range Status  02/09/2022 8.9 (H) <5.7 % of total Hgb Final    Comment:    For someone without known diabetes, a hemoglobin A1c value of 6.5% or greater indicates that they may have  diabetes and this should be confirmed with a follow-up  test. . For someone with known diabetes, a value <7% indicates  that their diabetes is well controlled and a value  greater than or equal to 7% indicates suboptimal  control. A1c targets should be individualized based on  duration of diabetes, age, comorbid conditions, and  other considerations. . Currently, no consensus exists regarding use of hemoglobin A1c for diagnosis of diabetes for children. .          Failed - Valid encounter within last 6 months    Recent Outpatient Visits           1 year ago General medical exam   [facility] Donita Brooks, MD   2 years ago Constipation due to outlet dysfunction   [facility] Pickard, Priscille Heidelberg, MD   2 years ago General medical exam   [facility] Donita Brooks, MD   3 years ago Cellulitis of left little finger   [facility] Tanya Nones, Priscille Heidelberg, MD   3 years ago Hypothyroidism, unspecified type   [facility] Pickard, Priscille Heidelberg, MD              Passed -  eGFR in normal range and within 360 days    GFR, Est African American  Date Value Ref Range Status  06/23/2020 124 > OR = 60 mL/min/1.59m2 Final   GFR, Est Non African American  Date Value Ref Range Status  06/23/2020 107 > OR = 60 mL/min/1.75m2 Final   eGFR  Date Value Ref Range Status  02/09/2022 110 > OR = 60 mL/min/1.61m2 Final

## 2022-08-15 ENCOUNTER — Other Ambulatory Visit

## 2022-08-15 DIAGNOSIS — E1165 Type 2 diabetes mellitus with hyperglycemia: Secondary | ICD-10-CM

## 2022-08-16 LAB — VITAMIN B12: Vitamin B-12: 277 pg/mL (ref 200–1100)

## 2022-08-16 LAB — MICROALBUMIN / CREATININE URINE RATIO
Creatinine, Urine: 95 mg/dL (ref 20–320)
Microalb Creat Ratio: 3 mg/g creat (ref <30)
Microalb, Ur: 0.3 mg/dL

## 2022-08-16 LAB — HEMOGLOBIN A1C
Hgb A1c MFr Bld: 5.6 % of total Hgb (ref <5.7)
Mean Plasma Glucose: 114 mg/dL
eAG (mmol/L): 6.3 mmol/L

## 2022-08-21 ENCOUNTER — Other Ambulatory Visit: Payer: Self-pay | Admitting: Family Medicine

## 2022-09-19 ENCOUNTER — Ambulatory Visit: Admitting: Family Medicine

## 2022-09-19 VITALS — BP 118/64 | HR 75 | Temp 97.5°F | Ht 71.0 in | Wt 177.0 lb

## 2022-09-19 DIAGNOSIS — R109 Unspecified abdominal pain: Secondary | ICD-10-CM | POA: Diagnosis not present

## 2022-09-19 NOTE — Progress Notes (Signed)
Subjective:    Patient ID: Mike Henderson, male    DOB: 10-08-1965, 57 y.o.   MRN: [id number]  Patient states that a week or so ago, he was sitting up out of the chair when he felt a tearing sensation in his right abdomen just adjacent to his umbilicus.  Since that time he has had pain in that area.  He states the pain feels similar to when he had an umbilical hernia.  He denies any blood in his stool.  He denies any change in stool caliber.  He denies any nausea or vomiting.  Today on exam, there is no inguinal hernia on either side with Valsalva.  There is no visible deformity in the abdominal wall.  With palpation at approximately 8:00 from the umbilicus, there is a small bulge that could be felt the patient tried to sit up.  There is no obvious palpable defect however.  However this is the location of the pain  Past Medical History:  Diagnosis Date   Anxiety    Panic attacks   Arthritis    Blood transfusion without reported diagnosis    Chronic prostatitis    Constipation    Depression    Diabetes mellitus without complication (HCC)    ED (erectile dysfunction)    Epididymitis    GERD (gastroesophageal reflux disease)    Hyperlipidemia    Migraines    OSA (obstructive sleep apnea) 02/01/12   SLEEP STUDY [facility]   RBBB    Sleep apnea    uses Cpap   Thyroid disease    hypothyroid   Past Surgical History:  Procedure Laterality Date   ANAL RECTAL MANOMETRY N/A 03/14/2019   Procedure: ANO RECTAL MANOMETRY;  Surgeon: Sherrilyn Rist, MD;  Location: [facility];  Service: Gastroenterology;  Laterality: N/A;   COLONOSCOPY  2001   normal [facility] pt[employer] [facility] [employer]   ELBOW SURGERY Bilateral    for tennis elbow   SHOULDER ARTHROSCOPY  09/2005   left   SHOULDER ARTHROSCOPY  01/2006   right   UMBILICAL HERNIA REPAIR  01/2009   UVULOPALATOPHARYNGOPLASTY  08/2001   Current Outpatient Medications on File Prior to Visit  Medication Sig Dispense Refill    atorvastatin (LIPITOR) 40 MG tablet TAKE 1 TABLET DAILY 90 tablet 3   Blood Glucose Monitoring Suppl (FREESTYLE LITE) DEVI Check BS bid E11.9 200 each 3   brexpiprazole (REXULTI) 1 MG TABS tablet Take 1 tablet (1 mg total) by mouth daily. 30 tablet 5   clonazePAM (KLONOPIN) 0.5 MG tablet TAKE 1 TABLET(0.5 MG) BY MOUTH TWICE DAILY AS NEEDED FOR ANXIETY 60 tablet 2   Continuous Blood Gluc Sensor (FREESTYLE LIBRE 3 SENSOR) MISC 1 Device by Does not apply route every 14 (fourteen) days. Place 1 sensor on the skin every 14 days. Use to check glucose continuously 2 each 11   diazepam (VALIUM) 5 MG tablet Take 1 tablet (5 mg total) by mouth every 12 (twelve) hours as needed for anxiety. 60 tablet 1   empagliflozin (JARDIANCE) 25 MG TABS tablet TAKE 1 TABLET DAILY BEFORE BREAKFAST (NEED COMPLETE PHYSICAL EXAM FOR FURTHER REFILLS, COURTESY REFILL) 90 tablet 0   glucose blood (FREESTYLE LITE) test strip Use to check blood sugars 4 times per day. 200 each 3   insulin aspart (NOVOLOG) 100 UNIT/ML injection Inject 2 Units into the skin 3 (three) times daily as needed for high blood sugar. 10 mL 11   insulin glargine (LANTUS  SOLOSTAR) 100 UNIT/ML Solostar Pen Inject 15 Units into the skin daily. 15 mL 1   Lancets (FREESTYLE) lancets USE TO CHECK BLOOD SUGAR TWICE DAILY 100 each 6   Lancets Misc. (ACCU-CHEK FASTCLIX LANCET) KIT Use to check BS bid DX: E11.9 1 kit 1   lansoprazole (PREVACID) 30 MG capsule TAKE 1 CAPSULE DAILY BEFORE A MEAL 90 capsule 3   levothyroxine (SYNTHROID) 88 MCG tablet TAKE 1 TABLET DAILY 90 tablet 3   metFORMIN (GLUCOPHAGE) 500 MG tablet TAKE 2 TABLETS TWICE A DAY WITH MEALS 360 tablet 3   Multiple Vitamin (MULTIVITAMIN) tablet Take 1 tablet by mouth daily.     OLANZapine (ZYPREXA) 5 MG tablet TAKE 1 TABLET(5 MG) BY MOUTH AT BEDTIME 30 tablet 0   pioglitazone (ACTOS) 30 MG tablet TAKE 1 TABLET(30 MG) BY MOUTH DAILY 90 tablet 0   Semaglutide, 2 MG/DOSE, 8 MG/3ML SOPN Inject 2 mg as  directed once a week. Trulicity is unavailable due to national back order 9 mL 3   TRINTELLIX 20 MG TABS tablet TAKE 1 TABLET DAILY 90 tablet 3   UNABLE TO FIND Med Name:CPAP THERAPHY     vardenafil (LEVITRA) 20 MG tablet Take 20 mg by mouth daily as needed.     No current facility-administered medications on file prior to visit.   No Known Allergies Social History   Socioeconomic History   Marital status: Married    Spouse name: Not on file   Number of children: 1   Years of education: Not on file   Highest education level: Some college, no degree  Occupational History   Not on file  Tobacco Use   Smoking status: Former    Types: Cigars   Smokeless tobacco: Never   Tobacco comments:    social  Vaping Use   Vaping status: Never Used  Substance and Sexual Activity   Alcohol use: Yes    Alcohol/week: 2.0 standard drinks of alcohol    Types: 2 Standard drinks or equivalent per week    Comment: socail 1 drink per week per pt   Drug use: No   Sexual activity: Not on file  Other Topics Concern   Not on file  Social History Narrative   Not on file   Social Determinants of Health   Financial Resource Strain: Low Risk  (05/18/2022)   Overall Financial Resource Strain (CARDIA)    Difficulty of Paying Living Expenses: Not hard at all  Food Insecurity: No Food Insecurity (05/18/2022)   Hunger Vital Sign    Worried About Running Out of Food in the Last Year: Never true    Ran Out of Food in the Last Year: Never true  Transportation Needs: No Transportation Needs (05/18/2022)   PRAPARE - Administrator, Civil Service (Medical): No    Lack of Transportation (Non-Medical): No  Physical Activity: Unknown (05/18/2022)   Exercise Vital Sign    Days of Exercise per Week: 0 days    Minutes of Exercise per Session: Not on file  Stress: Stress Concern Present (05/18/2022)   [employer] of Occupational Health - Occupational Stress Questionnaire    Feeling of Stress : Very  much  Social Connections: Unknown (05/18/2022)   Social Connection and Isolation Panel [NHANES]    Frequency of Communication with Friends and Family: Once a week    Frequency of Social Gatherings with Friends and Family: Once a week    Attends Religious Services: Patient declined    Active Member  of Clubs or Organizations: Yes    Attends Banker Meetings: More than 4 times per year    Marital Status: Married  Catering manager Violence: Not on file      Review of Systems  All other systems reviewed and are negative.      Objective:   Physical Exam Vitals reviewed.  Constitutional:      General: He is not in acute distress.    Appearance: Normal appearance. He is well-developed and normal weight. He is not ill-appearing, toxic-appearing or diaphoretic.  HENT:     Head: Normocephalic and atraumatic.  Eyes:     General: No scleral icterus. Neck:     Vascular: No JVD.  Cardiovascular:     Rate and Rhythm: Normal rate and regular rhythm.     Heart sounds: Normal heart sounds. No murmur heard.    No friction rub. No gallop.  Pulmonary:     Effort: Pulmonary effort is normal. No respiratory distress.     Breath sounds: Normal breath sounds. No stridor. No wheezing, rhonchi or rales.  Chest:     Chest wall: No tenderness.  Abdominal:     General: Bowel sounds are normal.     Palpations: There is no mass.     Tenderness: There is abdominal tenderness.     Hernia: A hernia is present. Hernia is present in the ventral area. There is no hernia in the umbilical area, left inguinal area or right inguinal area.    Musculoskeletal:     Cervical back: Neck supple.  Skin:    General: Skin is warm.     Coloration: Skin is not jaundiced or pale.     Findings: No bruising, erythema, lesion or rash.  Neurological:     General: No focal deficit present.     Mental Status: He is alert and oriented to person, place, and time. Mental status is at baseline.     Cranial  Nerves: No cranial nerve deficit.     Sensory: No sensory deficit.     Motor: No weakness.     Coordination: Coordination normal.     Gait: Gait normal.     Deep Tendon Reflexes: Reflexes normal.  Psychiatric:        Judgment: Judgment normal.           Assessment & Plan:  Abdominal wall pain Patient has certainly strained an abdominal wall muscle.  He may even have a small ventral hernia in the area diagrammed above.  However it is not obvious.  We have decided to monitor the situation for the next 2 or 3 weeks.  If the bulge becomes larger or if the pain persists, we will consult general surgery.  If the pain improves and the bulge does not get worse, it may simply be a strain in the abdominal wall muscle.  We discussed signs and symptoms of an incarcerated hernia.  We discussed what to do if that happens.

## 2022-10-24 ENCOUNTER — Other Ambulatory Visit: Payer: Self-pay | Admitting: Family Medicine

## 2022-10-24 NOTE — Telephone Encounter (Signed)
Requested Prescriptions  Pending Prescriptions Disp Refills   LANTUS SOLOSTAR 100 UNIT/ML Solostar Pen [Pharmacy Med Name: LANTUS SOLOSTAR PEN 5'S 100U/ML] 15 mL 3    Sig: INJECT 15 UNITS UNDER THE SKIN DAILY     Endocrinology:  Diabetes - Insulins Failed - 10/24/2022  7:41 AM      Failed - Valid encounter within last 6 months    Recent Outpatient Visits           1 year ago General medical exam   [facility] Donita Brooks, MD   2 years ago Constipation due to outlet dysfunction   [facility] Pickard, Priscille Heidelberg, MD   3 years ago General medical exam   [facility] Donita Brooks, MD   3 years ago Cellulitis of left little finger   [facility] Tanya Nones, Priscille Heidelberg, MD   3 years ago Hypothyroidism, unspecified type   [facility] Pickard, Priscille Heidelberg, MD              Passed - HBA1C is between 0 and 7.9 and within 180 days    Hgb A1c MFr Bld  Date Value Ref Range Status  08/15/2022 5.6 <5.7 % of total Hgb Final    Comment:    For the purpose of screening for the presence of diabetes: . <5.7%       Consistent with the absence of diabetes 5.7-6.4%    Consistent with increased risk for diabetes             (prediabetes) > or =6.5%  Consistent with diabetes . This assay result is consistent with a decreased risk of diabetes. . Currently, no consensus exists regarding use of hemoglobin A1c for diagnosis of diabetes in children. . According to American Diabetes Association (ADA) guidelines, hemoglobin A1c <7.0% represents optimal control in non-pregnant diabetic patients. Different metrics may apply to specific patient populations.  Standards of Medical Care in Diabetes(ADA). Marland Kitchen

## 2022-10-30 ENCOUNTER — Other Ambulatory Visit: Payer: Self-pay | Admitting: Family Medicine

## 2022-10-31 NOTE — Telephone Encounter (Signed)
Requested by interface surescripts. Last OV 03/20/22/ 09/19/22.  Requested Prescriptions  Pending Prescriptions Disp Refills   pioglitazone (ACTOS) 30 MG tablet [Pharmacy Med Name: PIOGLITAZONE 30MG  TABLETS] 90 tablet 0    Sig: TAKE 1 TABLET(30 MG) BY MOUTH DAILY     Endocrinology:  Diabetes - Glitazones - pioglitazone Failed - 10/30/2022 11:59 AM      Failed - Valid encounter within last 6 months    Recent Outpatient Visits           1 year ago General medical exam   [facility] Donita Brooks, MD   2 years ago Constipation due to outlet dysfunction   [facility] Pickard, Priscille Heidelberg, MD   3 years ago General medical exam   [facility] Donita Brooks, MD   3 years ago Cellulitis of left little finger   [facility] Tanya Nones, Priscille Heidelberg, MD   3 years ago Hypothyroidism, unspecified type   [facility] Pickard, Priscille Heidelberg, MD              Passed - HBA1C is between 0 and 7.9 and within 180 days    Hgb A1c MFr Bld  Date Value Ref Range Status  08/15/2022 5.6 <5.7 % of total Hgb Final    Comment:    For the purpose of screening for the presence of diabetes: . <5.7%       Consistent with the absence of diabetes 5.7-6.4%    Consistent with increased risk for diabetes             (prediabetes) > or =6.5%  Consistent with diabetes . This assay result is consistent with a decreased risk of diabetes. . Currently, no consensus exists regarding use of hemoglobin A1c for diagnosis of diabetes in children. . According to American Diabetes Association (ADA) guidelines, hemoglobin A1c <7.0% represents optimal control in non-pregnant diabetic patients. Different metrics may apply to specific patient populations.  Standards of Medical Care in Diabetes(ADA). Marland Kitchen

## 2022-11-11 ENCOUNTER — Other Ambulatory Visit: Payer: Self-pay | Admitting: Family Medicine

## 2022-11-13 NOTE — Telephone Encounter (Signed)
Requested medications are due for refill today.  yes  Requested medications are on the active medications list.  yes  Last refill. 08/09/2022 #90 0 rf  Future visit scheduled.   no  Notes to clinic.  Last refill stated pt needed a CPE for further refills. Pt has been seen in office, and has labs, however has not had a CPE. Please advise.    Requested Prescriptions  Pending Prescriptions Disp Refills   JARDIANCE 25 MG TABS tablet [Pharmacy Med Name: JARDIANCE TABS 25MG ] 90 tablet 3    Sig: TAKE 1 TABLET DAILY BEFORE BREAKFAST (NEED COMPLETE PHYSICAL EXAM FOR FURTHER REFILLS, COURTESY REFILL)     Endocrinology:  Diabetes - SGLT2 Inhibitors Failed - 11/11/2022  7:38 AM      Failed - Cr in normal range and within 360 days    Creat  Date Value Ref Range Status  02/09/2022 0.64 (L) 0.70 - 1.30 mg/dL Final   Creatinine, Urine  Date Value Ref Range Status  08/15/2022 95 20 - 320 mg/dL Final         Failed - Valid encounter within last 6 months    Recent Outpatient Visits           1 year ago General medical exam   [facility] Donita Brooks, MD   2 years ago Constipation due to outlet dysfunction   [facility] Pickard, Priscille Heidelberg, MD   3 years ago General medical exam   [facility] Donita Brooks, MD   3 years ago Cellulitis of left little finger   [facility] Tanya Nones, Priscille Heidelberg, MD   3 years ago Hypothyroidism, unspecified type   [facility] Pickard, Priscille Heidelberg, MD              Passed - HBA1C is between 0 and 7.9 and within 180 days    Hgb A1c MFr Bld  Date Value Ref Range Status  08/15/2022 5.6 <5.7 % of total Hgb Final    Comment:    For the purpose of screening for the presence of diabetes: . <5.7%       Consistent with the absence of diabetes 5.7-6.4%    Consistent with increased risk for diabetes             (prediabetes) > or =6.5%  Consistent with diabetes . This assay  result is consistent with a decreased risk of diabetes. . Currently, no consensus exists regarding use of hemoglobin A1c for diagnosis of diabetes in children. . According to American Diabetes Association (ADA) guidelines, hemoglobin A1c <7.0% represents optimal control in non-pregnant diabetic patients. Different metrics may apply to specific patient populations.  Standards of Medical Care in Diabetes(ADA). .          Passed - eGFR in normal range and within 360 days    GFR, Est African American  Date Value Ref Range Status  06/23/2020 124 > OR = 60 mL/min/1.23m2 Final   GFR, Est Non African American  Date Value Ref Range Status  06/23/2020 107 > OR = 60 mL/min/1.17m2 Final   eGFR  Date Value Ref Range Status  02/09/2022 110 > OR = 60 mL/min/1.68m2 Final

## 2022-11-16 ENCOUNTER — Encounter: Payer: Self-pay | Admitting: Family Medicine

## 2022-11-16 ENCOUNTER — Other Ambulatory Visit: Payer: Self-pay

## 2022-11-16 DIAGNOSIS — E119 Type 2 diabetes mellitus without complications: Secondary | ICD-10-CM

## 2022-11-16 DIAGNOSIS — E1165 Type 2 diabetes mellitus with hyperglycemia: Secondary | ICD-10-CM

## 2022-11-16 MED ORDER — EMPAGLIFLOZIN 25 MG PO TABS
25.0000 mg | ORAL_TABLET | Freq: Every day | ORAL | 2 refills | Status: DC
Start: 2022-11-16 — End: 2023-08-22

## 2022-12-13 LAB — HM DIABETES EYE EXAM

## 2023-01-10 ENCOUNTER — Other Ambulatory Visit: Payer: Self-pay | Admitting: Family Medicine

## 2023-01-10 DIAGNOSIS — E039 Hypothyroidism, unspecified: Secondary | ICD-10-CM

## 2023-01-19 ENCOUNTER — Other Ambulatory Visit: Payer: Self-pay | Admitting: Family Medicine

## 2023-02-01 ENCOUNTER — Other Ambulatory Visit: Payer: Self-pay | Admitting: Family Medicine

## 2023-02-10 ENCOUNTER — Other Ambulatory Visit: Payer: Self-pay | Admitting: Family Medicine

## 2023-02-13 NOTE — Telephone Encounter (Signed)
 Requested Prescriptions  Refused Prescriptions Disp Refills   pioglitazone  (ACTOS ) 30 MG tablet [Pharmacy Med Name: PIOGLITAZONE  30MG  TABLETS] 90 tablet 0    Sig: TAKE 1 TABLET(30 MG) BY MOUTH DAILY     Endocrinology:  Diabetes - Glitazones - pioglitazone  Failed - 02/13/2023  7:40 AM      Failed - HBA1C is between 0 and 7.9 and within 180 days    Hgb A1c MFr Bld  Date Value Ref Range Status  08/15/2022 5.6 <5.7 % of total Hgb Final    Comment:    For the purpose of screening for the presence of diabetes: . <5.7%       Consistent with the absence of diabetes 5.7-6.4%    Consistent with increased risk for diabetes             (prediabetes) > or =6.5%  Consistent with diabetes . This assay result is consistent with a decreased risk of diabetes. . Currently, no consensus exists regarding use of hemoglobin A1c for diagnosis of diabetes in children. . According to American Diabetes Association (ADA) guidelines, hemoglobin A1c <7.0% represents optimal control in non-pregnant diabetic patients. Different metrics may apply to specific patient populations.  Standards of Medical Care in Diabetes(ADA). .          Failed - Valid encounter within last 6 months    Recent Outpatient Visits           1 year ago General medical exam   [facility] Duanne Butler DASEN, MD   3 years ago Constipation due to outlet dysfunction   [facility] Pickard, Butler DASEN, MD   3 years ago General medical exam   [facility] Duanne Butler DASEN, MD   3 years ago Cellulitis of left little finger   [facility] Duanne, Butler DASEN, MD   3 years ago Hypothyroidism, unspecified type   [facility] Pickard, Butler DASEN, MD

## 2023-03-13 ENCOUNTER — Other Ambulatory Visit

## 2023-03-13 DIAGNOSIS — Z125 Encounter for screening for malignant neoplasm of prostate: Secondary | ICD-10-CM

## 2023-03-13 DIAGNOSIS — E1165 Type 2 diabetes mellitus with hyperglycemia: Secondary | ICD-10-CM

## 2023-03-13 DIAGNOSIS — E785 Hyperlipidemia, unspecified: Secondary | ICD-10-CM

## 2023-03-13 DIAGNOSIS — E039 Hypothyroidism, unspecified: Secondary | ICD-10-CM

## 2023-03-13 DIAGNOSIS — E291 Testicular hypofunction: Secondary | ICD-10-CM

## 2023-03-14 LAB — COMPLETE METABOLIC PANEL WITH GFR
AG Ratio: 1.9 (calc) (ref 1.0–2.5)
ALT: 16 U/L (ref 9–46)
AST: 12 U/L (ref 10–35)
Albumin: 4.5 g/dL (ref 3.6–5.1)
Alkaline phosphatase (APISO): 88 U/L (ref 35–144)
BUN: 14 mg/dL (ref 7–25)
CO2: 25 mmol/L (ref 20–32)
Calcium: 9.7 mg/dL (ref 8.6–10.3)
Chloride: 104 mmol/L (ref 98–110)
Creat: 0.81 mg/dL (ref 0.70–1.30)
Globulin: 2.4 g/dL (ref 1.9–3.7)
Glucose, Bld: 162 mg/dL — ABNORMAL HIGH (ref 65–99)
Potassium: 4.2 mmol/L (ref 3.5–5.3)
Sodium: 140 mmol/L (ref 135–146)
Total Bilirubin: 0.4 mg/dL (ref 0.2–1.2)
Total Protein: 6.9 g/dL (ref 6.1–8.1)
eGFR: 102 mL/min/{1.73_m2} (ref >=60)

## 2023-03-14 LAB — CBC WITH DIFFERENTIAL/PLATELET
Absolute Lymphocytes: 2761 {cells}/uL (ref 850–3900)
Absolute Monocytes: 530 {cells}/uL (ref 200–950)
Basophils Absolute: 88 {cells}/uL (ref 0–200)
Basophils Relative: 1.3 %
Eosinophils Absolute: 122 {cells}/uL (ref 15–500)
Eosinophils Relative: 1.8 %
HCT: 44.8 % (ref 38.5–50.0)
Hemoglobin: 15.2 g/dL (ref 13.2–17.1)
MCH: 31.1 pg (ref 27.0–33.0)
MCHC: 33.9 g/dL (ref 32.0–36.0)
MCV: 91.8 fL (ref 80.0–100.0)
MPV: 9.9 fL (ref 7.5–12.5)
Monocytes Relative: 7.8 %
Neutro Abs: 3298 {cells}/uL (ref 1500–7800)
Neutrophils Relative %: 48.5 %
Platelets: 253 10*3/uL (ref 140–400)
RBC: 4.88 10*6/uL (ref 4.20–5.80)
RDW: 12 % (ref 11.0–15.0)
Total Lymphocyte: 40.6 %
WBC: 6.8 10*3/uL (ref 3.8–10.8)

## 2023-03-14 LAB — LIPID PANEL
Cholesterol: 123 mg/dL (ref <200)
HDL: 51 mg/dL (ref >=40)
LDL Cholesterol (Calc): 55 mg/dL
Non-HDL Cholesterol (Calc): 72 mg/dL (ref <130)
Total CHOL/HDL Ratio: 2.4 (calc) (ref <5.0)
Triglycerides: 83 mg/dL (ref <150)

## 2023-03-14 LAB — PSA: PSA: 0.4 ng/mL (ref <=4.00)

## 2023-03-14 LAB — TESTOSTERONE: Testosterone: 707 ng/dL (ref 250–827)

## 2023-03-14 LAB — HEMOGLOBIN A1C
Hgb A1c MFr Bld: 5.6 %{Hb} (ref <5.7)
Mean Plasma Glucose: 114 mg/dL
eAG (mmol/L): 6.3 mmol/L

## 2023-03-14 LAB — TSH: TSH: 1.24 m[IU]/L (ref 0.40–4.50)

## 2023-03-18 ENCOUNTER — Other Ambulatory Visit: Payer: Self-pay | Admitting: Family Medicine

## 2023-03-20 ENCOUNTER — Encounter: Payer: Self-pay | Admitting: Family Medicine

## 2023-03-20 ENCOUNTER — Ambulatory Visit: Admitting: Family Medicine

## 2023-03-20 VITALS — BP 118/70 | HR 71 | Temp 97.8°F | Ht 71.0 in | Wt 184.0 lb

## 2023-03-20 DIAGNOSIS — E291 Testicular hypofunction: Secondary | ICD-10-CM

## 2023-03-20 DIAGNOSIS — Z0001 Encounter for general adult medical examination with abnormal findings: Secondary | ICD-10-CM

## 2023-03-20 DIAGNOSIS — Z23 Encounter for immunization: Secondary | ICD-10-CM | POA: Diagnosis not present

## 2023-03-20 DIAGNOSIS — E119 Type 2 diabetes mellitus without complications: Secondary | ICD-10-CM

## 2023-03-20 DIAGNOSIS — Z0000 Encounter for general adult medical examination without abnormal findings: Secondary | ICD-10-CM

## 2023-03-20 DIAGNOSIS — Z7984 Long term (current) use of oral hypoglycemic drugs: Secondary | ICD-10-CM

## 2023-03-20 DIAGNOSIS — E785 Hyperlipidemia, unspecified: Secondary | ICD-10-CM

## 2023-03-20 DIAGNOSIS — F331 Major depressive disorder, recurrent, moderate: Secondary | ICD-10-CM

## 2023-03-20 NOTE — Progress Notes (Signed)
Wt Readings from Last 3 Encounters:  03/20/23 184 lb (83.5 kg)  09/19/22 177 lb (80.3 kg)  05/22/22 193 lb 6.4 oz (87.7 kg)     Subjective:    Patient ID: Mike Henderson, male    DOB: [birth year], 58 y.o.   MRN: [id number]   Patient is here today for complete physical exam.  Patient's last colonoscopy was in 2021.  They recommended a repeat colonoscopy in 2030 work.  He is concerned by prostate cancer as his father had prostate cancer however his most recent PSA was 0.4.  He is due for the new pneumonia vaccine.  He has had the shingles vaccine.  His flu shot is due but he declines this today.  Tetanus shot is up-to-date.  Patient is due for diabetic foot exam.  He is currently seeing psychiatry for depression.  The combination of Zyprexa with his antidepressant seems to be helping.  He is also Belsomra for his insomnia.  His most recent lab work is listed below: Lab on 03/13/2023  Component Date Value Ref Range Status   WBC 03/13/2023 6.8  3.8 - 10.8 Thousand/uL Final   RBC 03/13/2023 4.88  4.20 - 5.80 Million/uL Final   Hemoglobin 03/13/2023 15.2  13.2 - 17.1 g/dL Final   HCT 78/29/5621 44.8  38.5 - 50.0 % Final   MCV 03/13/2023 91.8  80.0 - 100.0 fL Final   MCH 03/13/2023 31.1  27.0 - 33.0 pg Final   MCHC 03/13/2023 33.9  32.0 - 36.0 g/dL Final   Comment: For adults, a slight decrease in the calculated MCHC value (in the range of 30 to 32 g/dL) is most likely not clinically significant; however, it should be interpreted with caution in correlation with other red cell parameters and the patient's clinical condition.    RDW 03/13/2023 12.0  11.0 - 15.0 % Final   Platelets 03/13/2023 253  140 - 400 Thousand/uL Final   MPV 03/13/2023 9.9  7.5 - 12.5 fL Final   Neutro Abs 03/13/2023 3,298  1,500 - 7,800 cells/uL Final   Absolute Lymphocytes 03/13/2023 2,761  850 - 3,900 cells/uL Final   Absolute Monocytes 03/13/2023 530  200 - 950 cells/uL Final   Eosinophils Absolute 03/13/2023 122   15 - 500 cells/uL Final   Basophils Absolute 03/13/2023 88  0 - 200 cells/uL Final   Neutrophils Relative % 03/13/2023 48.5  % Final   Total Lymphocyte 03/13/2023 40.6  % Final   Monocytes Relative 03/13/2023 7.8  % Final   Eosinophils Relative 03/13/2023 1.8  % Final   Basophils Relative 03/13/2023 1.3  % Final   Glucose, Bld 03/13/2023 162 (H)  65 - 99 mg/dL Final   Comment: .            Fasting reference interval . For someone without known diabetes, a glucose value >125 mg/dL indicates that they may have diabetes and this should be confirmed with a follow-up test. .    BUN 03/13/2023 14  7 - 25 mg/dL Final   Creat 30/86/5784 0.81  0.70 - 1.30 mg/dL Final   eGFR 69/62/9528 102  > OR = 60 mL/min/1.49m2 Final   BUN/Creatinine Ratio 03/13/2023 SEE NOTE:  6 - 22 (calc) Final   Comment:    Not Reported: BUN and Creatinine are within    reference range. .    Sodium 03/13/2023 140  135 - 146 mmol/L Final   Potassium 03/13/2023 4.2  3.5 - 5.3 mmol/L Final   Chloride 03/13/2023  104  98 - 110 mmol/L Final   CO2 03/13/2023 25  20 - 32 mmol/L Final   Calcium 03/13/2023 9.7  8.6 - 10.3 mg/dL Final   Total Protein 16/10/9602 6.9  6.1 - 8.1 g/dL Final   Albumin 54/09/8117 4.5  3.6 - 5.1 g/dL Final   Globulin 14/78/2956 2.4  1.9 - 3.7 g/dL (calc) Final   AG Ratio 03/13/2023 1.9  1.0 - 2.5 (calc) Final   Total Bilirubin 03/13/2023 0.4  0.2 - 1.2 mg/dL Final   Alkaline phosphatase (APISO) 03/13/2023 88  35 - 144 U/L Final   AST 03/13/2023 12  10 - 35 U/L Final   ALT 03/13/2023 16  9 - 46 U/L Final   Cholesterol 03/13/2023 123  <200 mg/dL Final   HDL 21/30/8657 51  > OR = 40 mg/dL Final   Triglycerides 84/69/6295 83  <150 mg/dL Final   LDL Cholesterol (Calc) 03/13/2023 55  mg/dL (calc) Final   Comment: Reference range: <100 . Desirable range <100 mg/dL for primary prevention;   <70 mg/dL for patients with CHD or diabetic patients  with > or = 2 CHD risk factors. Marland Kitchen LDL-C is now  calculated using the Martin-Hopkins  calculation, which is a validated novel method providing  better accuracy than the Friedewald equation in the  estimation of LDL-C.  Horald Pollen et al. Lenox Ahr. 2841;324(40): 2061-2068  (http://education.[facility].com/faq/FAQ164)    Total CHOL/HDL Ratio 03/13/2023 2.4  <1.0 (calc) Final   Non-HDL Cholesterol (Calc) 03/13/2023 72  <130 mg/dL (calc) Final   Comment: For patients with diabetes plus 1 major ASCVD risk  factor, treating to a non-HDL-C goal of <100 mg/dL  (LDL-C of <27 mg/dL) is considered a therapeutic  option.    Hgb A1c MFr Bld 03/13/2023 5.6  <5.7 % of total Hgb Final   Comment: For the purpose of screening for the presence of diabetes: . <5.7%       Consistent with the absence of diabetes 5.7-6.4%    Consistent with increased risk for diabetes             (prediabetes) > or =6.5%  Consistent with diabetes . This assay result is consistent with a decreased risk of diabetes. . Currently, no consensus exists regarding use of hemoglobin A1c for diagnosis of diabetes in children. . According to American Diabetes Association (ADA) guidelines, hemoglobin A1c <7.0% represents optimal control in non-pregnant diabetic patients. Different metrics may apply to specific patient populations.  Standards of Medical Care in Diabetes(ADA). .    Mean Plasma Glucose 03/13/2023 114  mg/dL Final   eAG (mmol/L) 25/36/6440 6.3  mmol/L Final   Testosterone 03/13/2023 707  250 - 827 ng/dL Final   TSH 34/74/2595 1.24  0.40 - 4.50 mIU/L Final   PSA 03/13/2023 0.40  < OR = 4.00 ng/mL Final   Comment: The total PSA value from this assay system is  standardized against the WHO standard. The test  result will be approximately 20% lower when compared  to the equimolar-standardized total PSA (Beckman  Coulter). Comparison of serial PSA results should be  interpreted with this fact in mind. . This test was performed using the Siemens   chemiluminescent method. Values obtained from  different assay methods cannot be used interchangeably. PSA levels, regardless of value, should not be interpreted as absolute evidence of the presence or absence of disease.    PSA is normal showing no indication for prostate cancer.  Hemoglobin A1c has dropped dramatically to 6.4.  LDL cholesterol is well below 100.  TSH is normal.  Testosterone is greater than 600.  This is despite the fact he is not administered a testosterone shot in more than 2 months.  The patient has drastically changed his diet and has lost weight.  He also recently had a colonoscopy in January which was normal.  They have not recommended a repeat colonoscopy for 10 years.  He does have rectal pain due to abnormal sphincter relaxation proven on anal manometry.  Otherwise his work-up was unremarkable. Past Medical History:  Diagnosis Date   Anxiety    Panic attacks   Arthritis    Blood transfusion without reported diagnosis    Chronic prostatitis    Constipation    Depression    Diabetes mellitus without complication (HCC)    ED (erectile dysfunction)    Epididymitis    GERD (gastroesophageal reflux disease)    Hyperlipidemia    Migraines    OSA (obstructive sleep apnea) 02/01/12   SLEEP STUDY [facility]   RBBB    Sleep apnea    uses Cpap   Thyroid disease    hypothyroid   Past Surgical History:  Procedure Laterality Date   ANAL RECTAL MANOMETRY N/A 03/14/2019   Procedure: ANO RECTAL MANOMETRY;  Surgeon: Sherrilyn Rist, MD;  Location: [facility];  Service: Gastroenterology;  Laterality: N/A;   COLONOSCOPY  2001   [facility] [place] [employer]   ELBOW SURGERY Bilateral    for tennis elbow   SHOULDER ARTHROSCOPY  09/2005   left   SHOULDER ARTHROSCOPY  01/2006   right   UMBILICAL HERNIA REPAIR  01/2009   UVULOPALATOPHARYNGOPLASTY  08/2001   Current Outpatient Medications on File Prior to Visit  Medication Sig Dispense Refill    ALPRAZolam (XANAX) 1 MG tablet      atorvastatin (LIPITOR) 40 MG tablet TAKE 1 TABLET DAILY 90 tablet 3   BELSOMRA 20 MG TABS Take 1 tablet by mouth at bedtime.     diazepam (VALIUM) 5 MG tablet Take 1 tablet (5 mg total) by mouth every 12 (twelve) hours as needed for anxiety. 60 tablet 1   DULoxetine (CYMBALTA) 60 MG capsule Take 60 mg by mouth daily.     empagliflozin (JARDIANCE) 25 MG TABS tablet Take 1 tablet (25 mg total) by mouth daily. 90 tablet 2   glucose blood (FREESTYLE LITE) test strip Use to check blood sugars 4 times per day. 200 each 3   insulin aspart (NOVOLOG) 100 UNIT/ML injection Inject 2 Units into the skin 3 (three) times daily as needed for high blood sugar. 10 mL 11   Lancets (FREESTYLE) lancets USE TO CHECK BLOOD SUGAR TWICE DAILY 100 each 6   Lancets Misc. (ACCU-CHEK FASTCLIX LANCET) KIT Use to check BS bid DX: E11.9 1 kit 1   lansoprazole (PREVACID) 30 MG capsule TAKE 1 CAPSULE DAILY BEFORE A MEAL 90 capsule 3   LANTUS SOLOSTAR 100 UNIT/ML Solostar Pen INJECT 15 UNITS UNDER THE SKIN DAILY 15 mL 3   levothyroxine (SYNTHROID) 88 MCG tablet TAKE 1 TABLET DAILY 90 tablet 3   metFORMIN (GLUCOPHAGE) 500 MG tablet TAKE 2 TABLETS TWICE A DAY WITH MEALS 360 tablet 3   Multiple Vitamin (MULTIVITAMIN) tablet Take 1 tablet by mouth daily.     OLANZapine (ZYPREXA) 5 MG tablet TAKE 1 TABLET(5 MG) BY MOUTH AT BEDTIME 30 tablet 0   OZEMPIC, 2 MG/DOSE, 8 MG/3ML SOPN INJECT 2 MG AS DIRECTED ONCE  A WEEK 9 mL 3   PARoxetine (PAXIL) 10 MG tablet      pioglitazone (ACTOS) 30 MG tablet TAKE 1 TABLET(30 MG) BY MOUTH DAILY 90 tablet 0   traZODone (DESYREL) 50 MG tablet Take 50 mg by mouth at bedtime.     UNABLE TO FIND Med Name:CPAP THERAPHY     vardenafil (LEVITRA) 20 MG tablet Take 20 mg by mouth daily as needed.     No current facility-administered medications on file prior to visit.   No Known Allergies Social History   Socioeconomic History   Marital status: Married    Spouse  name: Not on file   Number of children: 1   Years of education: Not on file   Highest education level: Some college, no degree  Occupational History   Not on file  Tobacco Use   Smoking status: Former    Types: Cigars   Smokeless tobacco: Never   Tobacco comments:    social  Vaping Use   Vaping status: Never Used  Substance and Sexual Activity   Alcohol use: Yes    Alcohol/week: 2.0 standard drinks of alcohol    Types: 2 Standard drinks or equivalent per week    Comment: socail 1 drink per week per pt   Drug use: No   Sexual activity: Not on file  Other Topics Concern   Not on file  Social History Narrative   Not on file   Social Drivers of Health   Financial Resource Strain: Low Risk  (03/15/2023)   Overall Financial Resource Strain (CARDIA)    Difficulty of Paying Living Expenses: Not hard at all  Food Insecurity: No Food Insecurity (03/15/2023)   Hunger Vital Sign    Worried About Running Out of Food in the Last Year: Never true    Ran Out of Food in the Last Year: Never true  Transportation Needs: No Transportation Needs (03/15/2023)   PRAPARE - Administrator, Civil Service (Medical): No    Lack of Transportation (Non-Medical): No  Physical Activity: Unknown (03/15/2023)   Exercise Vital Sign    Days of Exercise per Week: 0 days    Minutes of Exercise per Session: Not on file  Stress: Stress Concern Present (03/15/2023)   [employer] of Occupational Health - Occupational Stress Questionnaire    Feeling of Stress : To some extent  Social Connections: Moderately Isolated (03/15/2023)   Social Connection and Isolation Panel [NHANES]    Frequency of Communication with Friends and Family: Once a week    Frequency of Social Gatherings with Friends and Family: Once a week    Attends Religious Services: Never    Database administrator or Organizations: Yes    Attends Engineer, structural: More than 4 times per year    Marital Status: Married   Catering manager Violence: Not on file      Review of Systems  All other systems reviewed and are negative.      Objective:   Physical Exam Vitals reviewed.  Constitutional:      General: He is not in acute distress.    Appearance: Normal appearance. He is well-developed and normal weight. He is not ill-appearing, toxic-appearing or diaphoretic.  HENT:     Head: Normocephalic and atraumatic.     Right Ear: Tympanic membrane, ear canal and external ear normal. There is no impacted cerumen.     Left Ear: Tympanic membrane, ear canal and external ear normal. There is  no impacted cerumen.     Nose: Nose normal. No congestion or rhinorrhea.     Mouth/Throat:     Mouth: Mucous membranes are moist.     Pharynx: No oropharyngeal exudate or posterior oropharyngeal erythema.  Eyes:     General: No scleral icterus.       Right eye: No discharge.        Left eye: No discharge.     Extraocular Movements: Extraocular movements intact.     Conjunctiva/sclera: Conjunctivae normal.     Pupils: Pupils are equal, round, and reactive to light.  Neck:     Vascular: No carotid bruit or JVD.  Cardiovascular:     Rate and Rhythm: Normal rate and regular rhythm.     Heart sounds: Normal heart sounds. No murmur heard.    No friction rub. No gallop.  Pulmonary:     Effort: Pulmonary effort is normal. No respiratory distress.     Breath sounds: Normal breath sounds. No stridor. No wheezing, rhonchi or rales.  Chest:     Chest wall: No tenderness.  Abdominal:     General: Abdomen is flat. Bowel sounds are normal. There is no distension.     Palpations: Abdomen is soft. There is no mass.     Tenderness: There is no abdominal tenderness. There is no right CVA tenderness, left CVA tenderness, guarding or rebound.     Hernia: No hernia is present.  Musculoskeletal:        General: Normal range of motion.     Left shoulder: Tenderness present. Normal strength.     Cervical back: Normal range of  motion and neck supple. No rigidity or tenderness.     Right lower leg: No edema.     Left lower leg: No edema.  Lymphadenopathy:     Cervical: No cervical adenopathy.  Skin:    General: Skin is warm.     Coloration: Skin is not jaundiced or pale.     Findings: No bruising, erythema, lesion or rash.  Neurological:     General: No focal deficit present.     Mental Status: He is alert and oriented to person, place, and time. Mental status is at baseline.     Cranial Nerves: No cranial nerve deficit.     Sensory: No sensory deficit.     Motor: No weakness.     Coordination: Coordination normal.     Gait: Gait normal.     Deep Tendon Reflexes: Reflexes normal.  Psychiatric:        Mood and Affect: Mood normal.        Behavior: Behavior normal.        Thought Content: Thought content normal.        Judgment: Judgment normal.           Assessment & Plan:  Immunization due - Plan: Pneumococcal Conjugate PCV21(Capvaxive)  Controlled type 2 diabetes mellitus without complication, without long-term current use of insulin (HCC)  Moderate episode of recurrent major depressive disorder (HCC)  Hypogonadism in male  Hyperlipidemia, unspecified hyperlipidemia type  General medical exam Patient received capvaxive.  Declined flu shot.  Diabetic foot exam performed and is normal.  Blood pressure is excellent at 118/70.  Hemoglobin A1c was outstanding at 5.6.  We have decided to discontinue his insulin/Lantus.  He will let me know if his blood sugars change dramatically.  Cholesterol is outstanding.  TSH is normal.  Testosterone level is within normal range now at 700.  Overall patient is doing relatively well.  I congratulated him on his excellent lab work.

## 2023-04-05 ENCOUNTER — Other Ambulatory Visit: Payer: Self-pay | Admitting: Family Medicine

## 2023-04-12 ENCOUNTER — Other Ambulatory Visit: Payer: Self-pay | Admitting: Family Medicine

## 2023-04-12 MED ORDER — METFORMIN HCL 500 MG PO TABS: 500.0000 mg | ORAL_TABLET | Freq: Two times a day (BID) | ORAL | 0 refills | Status: DC

## 2023-04-17 ENCOUNTER — Other Ambulatory Visit: Payer: Self-pay | Admitting: Family Medicine

## 2023-04-17 ENCOUNTER — Other Ambulatory Visit: Payer: Self-pay

## 2023-04-17 DIAGNOSIS — E785 Hyperlipidemia, unspecified: Secondary | ICD-10-CM

## 2023-04-17 DIAGNOSIS — E119 Type 2 diabetes mellitus without complications: Secondary | ICD-10-CM

## 2023-04-17 MED ORDER — METFORMIN HCL 500 MG PO TABS: 1000.0000 mg | ORAL_TABLET | Freq: Two times a day (BID) | ORAL | 2 refills | Status: DC

## 2023-05-09 ENCOUNTER — Other Ambulatory Visit: Payer: Self-pay

## 2023-05-09 NOTE — Telephone Encounter (Signed)
 Prescription Request  05/09/2023  LOV: 03/20/23 cpe  What is the name of the medication or equipment? pioglitazone (ACTOS) 30 MG tablet [[id number]]   Have you contacted your pharmacy to request a refill? Yes   Which pharmacy would you like this sent to?  [facility] - [place], [place] - [address] [place] [address] Phone: [phone] Fax: [phone]    Patient notified that their request is being sent to the clinical staff for review and that they should receive a response within 2 business days.   Please advise at [facility] [phone]

## 2023-05-10 MED ORDER — PIOGLITAZONE HCL 30 MG PO TABS: 30.0000 mg | ORAL_TABLET | Freq: Every day | ORAL | 1 refills | Status: DC

## 2023-05-10 NOTE — Telephone Encounter (Signed)
 Requested Prescriptions  Pending Prescriptions Disp Refills   pioglitazone (ACTOS) 30 MG tablet 90 tablet 1    Sig: Take 1 tablet (30 mg total) by mouth daily.     Endocrinology:  Diabetes - Glitazones - pioglitazone Failed - 05/10/2023 10:23 AM      Failed - Valid encounter within last 6 months    Recent Outpatient Visits           1 month ago Immunization due   [place] [facility] Tanya Nones, Priscille Heidelberg, MD   7 months ago Abdominal wall pain   [place] [facility] Donita Brooks, MD   11 months ago Moderate episode of recurrent major depressive disorder [facility])   [place] [facility] Donita Brooks, MD   1 year ago Uncontrolled type 2 diabetes mellitus with hyperglycemia [facility])   [place] [facility] Donita Brooks, MD   1 year ago Uncontrolled type 2 diabetes mellitus with hyperglycemia [facility])   [place] [facility] Pickard, Priscille Heidelberg, MD              Passed - HBA1C is between 0 and 7.9 and within 180 days    Hgb A1c MFr Bld  Date Value Ref Range Status  03/13/2023 5.6 <5.7 % of total Hgb Final    Comment:    For the purpose of screening for the presence of diabetes: . <5.7%       Consistent with the absence of diabetes 5.7-6.4%    Consistent with increased risk for diabetes             (prediabetes) > or =6.5%  Consistent with diabetes . This assay result is consistent with a decreased risk of diabetes. . Currently, no consensus exists regarding use of hemoglobin A1c for diagnosis of diabetes in children. . According to American Diabetes Association (ADA) guidelines, hemoglobin A1c <7.0% represents optimal control in non-pregnant diabetic patients. Different metrics may apply to specific patient populations.  Standards of Medical Care in Diabetes(ADA). Marland Kitchen

## 2023-05-22 ENCOUNTER — Ambulatory Visit: Admitting: Family Medicine

## 2023-06-12 ENCOUNTER — Other Ambulatory Visit: Payer: Self-pay | Admitting: Family Medicine

## 2023-06-13 NOTE — Telephone Encounter (Signed)
 Requested Prescriptions  Pending Prescriptions Disp Refills   lansoprazole  (PREVACID ) 30 MG capsule [Pharmacy Med Name: LANSOPRAZOLE  DR CAPS 30MG ] 90 capsule 1    Sig: TAKE 1 CAPSULE DAILY BEFORE A MEAL     Gastroenterology: Proton Pump Inhibitors 2 Passed - 06/13/2023  3:49 PM      Passed - ALT in normal range and within 360 days    ALT  Date Value Ref Range Status  03/13/2023 16 9 - 46 U/L Final         Passed - AST in normal range and within 360 days    AST  Date Value Ref Range Status  03/13/2023 12 10 - 35 U/L Final         Passed - Valid encounter within last 12 months    Recent Outpatient Visits           2 months ago Immunization due   [place] [facility] Pickard, Cisco Crest, MD   8 months ago Abdominal wall pain   [place] [facility] Austine Lefort, MD   1 year ago Moderate episode of recurrent major depressive disorder [facility])   [place] [facility] Austine Lefort, MD   1 year ago Uncontrolled type 2 diabetes mellitus with hyperglycemia [facility])   [place] [facility] Austine Lefort, MD   1 year ago Uncontrolled type 2 diabetes mellitus with hyperglycemia [facility])   [place] [facility] Pickard, Cisco Crest, MD

## 2023-06-18 ENCOUNTER — Other Ambulatory Visit: Payer: Self-pay | Admitting: Family Medicine

## 2023-06-20 NOTE — Telephone Encounter (Signed)
 Requested Prescriptions  Pending Prescriptions Disp Refills   atorvastatin  (LIPITOR) 40 MG tablet [Pharmacy Med Name: ATORVASTATIN  TABS 40MG ] 90 tablet 3    Sig: TAKE 1 TABLET DAILY     Cardiovascular:  Antilipid - Statins Failed - 06/20/2023 12:59 PM      Failed - Lipid Panel in normal range within the last 12 months    Cholesterol  Date Value Ref Range Status  03/13/2023 123 <200 mg/dL Final   LDL Cholesterol (Calc)  Date Value Ref Range Status  03/13/2023 55 mg/dL (calc) Final    Comment:    Reference range: <100 . Desirable range <100 mg/dL for primary prevention;   <70 mg/dL for patients with CHD or diabetic patients  with > or = 2 CHD risk factors. Aaron Aas LDL-C is now calculated using the Martin-Hopkins  calculation, which is a validated novel method providing  better accuracy than the Friedewald equation in the  estimation of LDL-C.  Melinda Sprawls et al. Erroll Heard. 1610;960(45): 2061-2068  (http://education.[facility].com/faq/FAQ164)    HDL  Date Value Ref Range Status  03/13/2023 51 > OR = 40 mg/dL Final   Triglycerides  Date Value Ref Range Status  03/13/2023 83 <150 mg/dL Final         Passed - Patient is not pregnant      Passed - Valid encounter within last 12 months    Recent Outpatient Visits           3 months ago Immunization due   [place] [facility] Pickard, Cisco Crest, MD   9 months ago Abdominal wall pain   [place] [facility] Austine Lefort, MD   1 year ago Moderate episode of recurrent major depressive disorder [facility])   [place] [facility] Austine Lefort, MD   1 year ago Uncontrolled type 2 diabetes mellitus with hyperglycemia [facility])   [place] [facility] Austine Lefort, MD   1 year ago Uncontrolled type 2 diabetes mellitus with hyperglycemia [facility])   [place] [facility] Pickard, Cisco Crest, MD

## 2023-07-09 ENCOUNTER — Ambulatory Visit: Admitting: Family Medicine

## 2023-07-09 ENCOUNTER — Encounter: Payer: Self-pay | Admitting: Family Medicine

## 2023-07-09 VITALS — BP 117/78 | HR 67 | Temp 97.4°F | Ht 71.0 in | Wt 186.6 lb

## 2023-07-09 DIAGNOSIS — H0100B Unspecified blepharitis left eye, upper and lower eyelids: Secondary | ICD-10-CM | POA: Diagnosis not present

## 2023-07-09 MED ORDER — SULFACETAMIDE SODIUM 10 % OP OINT: TOPICAL_OINTMENT | Freq: Four times a day (QID) | OPHTHALMIC | 1 refills | Status: DC

## 2023-07-09 NOTE — Progress Notes (Signed)
 Subjective:    Patient ID: Mike Henderson, male    DOB: [birth year], 58 y.o.   MRN: [id number]  States that he has been with itching and watering in his left eye.  His right eye is not affected.  He has tried allergy drops with no relief.  Today on exam, there is yellow debris at the base of the eyelashes.  There is also swelling in the meibomian glands.  There is some mild erythema at the lid margins.  There is no significant swelling.  There is no conjunctival irritation.  He denies any blurry vision or eye pain. Past Medical History:  Diagnosis Date   Anxiety    Panic attacks   Arthritis    Blood transfusion without reported diagnosis    Chronic prostatitis    Constipation    Depression    Diabetes mellitus without complication (HCC)    ED (erectile dysfunction)    Epididymitis    GERD (gastroesophageal reflux disease)    Hyperlipidemia    Migraines    OSA (obstructive sleep apnea) 02/01/2012   SLEEP STUDY [facility]   RBBB    Sleep apnea    uses Cpap   Thyroid disease    hypothyroid   Ulcer 2003   Past Surgical History:  Procedure Laterality Date   ANAL RECTAL MANOMETRY N/A 03/14/2019   Procedure: ANO RECTAL MANOMETRY;  Surgeon: Albertina Hugger, MD;  Location: [facility];  Service: Gastroenterology;  Laterality: N/A;   COLONOSCOPY  2001   normal per pt. in [employer]   ELBOW SURGERY Bilateral    for tennis elbow   HERNIA REPAIR  2012   SHOULDER ARTHROSCOPY  09/2005   left   SHOULDER ARTHROSCOPY  01/2006   right   UMBILICAL HERNIA REPAIR  01/2009   UVULOPALATOPHARYNGOPLASTY  08/2001   Current Outpatient Medications on File Prior to Visit  Medication Sig Dispense Refill   ALPRAZolam  (XANAX ) 1 MG tablet      atorvastatin  (LIPITOR) 40 MG tablet TAKE 1 TABLET DAILY 90 tablet 3   BELSOMRA 20 MG TABS Take 1 tablet by mouth at bedtime.     diazepam  (VALIUM ) 5 MG tablet Take 1 tablet (5 mg total) by mouth every 12 (twelve) hours as needed  for anxiety. 60 tablet 1   DULoxetine (CYMBALTA) 60 MG capsule Take 60 mg by mouth daily.     empagliflozin  (JARDIANCE ) 25 MG TABS tablet Take 1 tablet (25 mg total) by mouth daily. 90 tablet 2   glucose blood (FREESTYLE LITE) test strip Use to check blood sugars 4 times per day. 200 each 3   insulin  aspart (NOVOLOG ) 100 UNIT/ML injection Inject 2 Units into the skin 3 (three) times daily as needed for high blood sugar. 10 mL 11   Lancets (FREESTYLE) lancets USE TO CHECK BLOOD SUGAR TWICE DAILY 100 each 6   Lancets Misc. (ACCU-CHEK FASTCLIX LANCET) KIT Use to check BS bid DX: E11.9 1 kit 1   lansoprazole  (PREVACID ) 30 MG capsule TAKE 1 CAPSULE DAILY BEFORE A MEAL 90 capsule 1   LANTUS  SOLOSTAR 100 UNIT/ML Solostar Pen INJECT 15 UNITS UNDER THE SKIN DAILY 15 mL 3   levothyroxine  (SYNTHROID ) 88 MCG tablet TAKE 1 TABLET DAILY 90 tablet 3   metFORMIN  (GLUCOPHAGE ) 500 MG tablet Take 2 tablets (1,000 mg total) by mouth 2 (two) times daily with a meal. 360 tablet 2   Multiple Vitamin (MULTIVITAMIN) tablet Take 1 tablet by mouth daily.  OLANZapine  (ZYPREXA ) 5 MG tablet TAKE 1 TABLET(5 MG) BY MOUTH AT BEDTIME 30 tablet 0   OZEMPIC , 2 MG/DOSE, 8 MG/3ML SOPN INJECT 2 MG AS DIRECTED ONCE A WEEK 9 mL 3   PARoxetine (PAXIL) 10 MG tablet      pioglitazone  (ACTOS ) 30 MG tablet Take 1 tablet (30 mg total) by mouth daily. 90 tablet 1   traZODone (DESYREL) 50 MG tablet Take 50 mg by mouth at bedtime.     UNABLE TO FIND Med Name:CPAP THERAPHY     vardenafil (LEVITRA) 20 MG tablet Take 20 mg by mouth daily as needed.     No current facility-administered medications on file prior to visit.   No Known Allergies Social History   Socioeconomic History   Marital status: Married    Spouse name: Not on file   Number of children: 1   Years of education: Not on file   Highest education level: Some college, no degree  Occupational History   Not on file  Tobacco Use   Smoking status: Former    Types: Cigars    Smokeless tobacco: Never   Tobacco comments:    social  Vaping Use   Vaping status: Never Used  Substance and Sexual Activity   Alcohol use: Yes    Alcohol/week: 2.0 standard drinks of alcohol    Types: 2 Standard drinks or equivalent per week    Comment: socail 1 drink per week per pt   Drug use: No   Sexual activity: Not on file  Other Topics Concern   Not on file  Social History Narrative   Not on file   Social Drivers of Health   Financial Resource Strain: Low Risk  (03/15/2023)   Overall Financial Resource Strain (CARDIA)    Difficulty of Paying Living Expenses: Not hard at all  Food Insecurity: No Food Insecurity (03/15/2023)   Hunger Vital Sign    Worried About Running Out of Food in the Last Year: Never true    Ran Out of Food in the Last Year: Never true  Transportation Needs: No Transportation Needs (03/15/2023)   PRAPARE - Administrator, Civil Service (Medical): No    Lack of Transportation (Non-Medical): No  Physical Activity: Unknown (03/15/2023)   Exercise Vital Sign    Days of Exercise per Week: 0 days    Minutes of Exercise per Session: Not on file  Stress: Stress Concern Present (03/15/2023)   [employer] of Occupational Health - Occupational Stress Questionnaire    Feeling of Stress : To some extent  Social Connections: Moderately Isolated (03/15/2023)   Social Connection and Isolation Panel [NHANES]    Frequency of Communication with Friends and Family: Once a week    Frequency of Social Gatherings with Friends and Family: Once a week    Attends Religious Services: Never    Database administrator or Organizations: Yes    Attends Engineer, structural: More than 4 times per year    Marital Status: Married  Catering manager Violence: Not on file      Review of Systems  All other systems reviewed and are negative.      Objective:   Physical Exam Vitals reviewed.  Constitutional:      General: He is not in acute distress.     Appearance: Normal appearance. He is well-developed and normal weight. He is not ill-appearing, toxic-appearing or diaphoretic.  HENT:     Head: Normocephalic and atraumatic.  Eyes:  General: No scleral icterus.       Right eye: No discharge or hordeolum.        Left eye: No discharge or hordeolum.     Extraocular Movements: Extraocular movements intact.  Neck:     Vascular: No JVD.  Cardiovascular:     Rate and Rhythm: Normal rate and regular rhythm.     Heart sounds: Normal heart sounds. No murmur heard.    No friction rub. No gallop.  Pulmonary:     Effort: Pulmonary effort is normal. No respiratory distress.     Breath sounds: Normal breath sounds. No stridor. No wheezing, rhonchi or rales.  Chest:     Chest wall: No tenderness.  Abdominal:     Hernia: There is no hernia in the umbilical area, left inguinal area or right inguinal area.  Neurological:     Mental Status: He is alert.           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  Recommended trying Bleph-10 antibiotic ointment 3 times a day to the lid margin.  Then using daily eyelid scrubs with baby shampoo to help prevent recurrences in the future.

## 2023-08-20 ENCOUNTER — Encounter: Payer: Self-pay | Admitting: Family Medicine

## 2023-08-20 ENCOUNTER — Other Ambulatory Visit: Payer: Self-pay

## 2023-08-20 ENCOUNTER — Other Ambulatory Visit: Payer: Self-pay | Admitting: Family Medicine

## 2023-08-20 DIAGNOSIS — E119 Type 2 diabetes mellitus without complications: Secondary | ICD-10-CM

## 2023-08-20 MED ORDER — PIOGLITAZONE HCL 30 MG PO TABS: 30.0000 mg | ORAL_TABLET | Freq: Every day | ORAL | 1 refills | Status: DC

## 2023-08-21 ENCOUNTER — Telehealth: Payer: Self-pay

## 2023-08-21 NOTE — Telephone Encounter (Signed)
 Fax received from [facility]  stating that Sulfacetamide  Sod 10 % Ophth Ointment is no longer being manufactured. They are requesting a new rx. Thank you.

## 2023-08-22 ENCOUNTER — Other Ambulatory Visit: Payer: Self-pay | Admitting: Family Medicine

## 2023-08-22 MED ORDER — ERYTHROMYCIN 5 MG/GM OP OINT: 1.0000 | TOPICAL_OINTMENT | Freq: Every day | OPHTHALMIC | 1 refills | Status: AC

## 2023-08-22 NOTE — Telephone Encounter (Signed)
 Requested Prescriptions  Pending Prescriptions Disp Refills   empagliflozin  (JARDIANCE ) 25 MG TABS tablet [Pharmacy Med Name: JARDIANCE  TABS 25MG ] 90 tablet 0    Sig: TAKE 1 TABLET DAILY     Endocrinology:  Diabetes - SGLT2 Inhibitors Passed - 08/22/2023 12:10 PM      Passed - Cr in normal range and within 360 days    Creat  Date Value Ref Range Status  03/13/2023 0.81 0.70 - 1.30 mg/dL Final   Creatinine, Urine  Date Value Ref Range Status  08/15/2022 95 20 - 320 mg/dL Final         Passed - HBA1C is between 0 and 7.9 and within 180 days    Hgb A1c MFr Bld  Date Value Ref Range Status  03/13/2023 5.6 <5.7 % of total Hgb Final    Comment:    For the purpose of screening for the presence of diabetes: . <5.7%       Consistent with the absence of diabetes 5.7-6.4%    Consistent with increased risk for diabetes             (prediabetes) > or =6.5%  Consistent with diabetes . This assay result is consistent with a decreased risk of diabetes. . Currently, no consensus exists regarding use of hemoglobin A1c for diagnosis of diabetes in children. . According to American Diabetes Association (ADA) guidelines, hemoglobin A1c <7.0% represents optimal control in non-pregnant diabetic patients. Different metrics may apply to specific patient populations.  Standards of Medical Care in Diabetes(ADA). .          Passed - eGFR in normal range and within 360 days    GFR, Est African American  Date Value Ref Range Status  06/23/2020 124 > OR = 60 mL/min/1.92m2 Final   GFR, Est Non African American  Date Value Ref Range Status  06/23/2020 107 > OR = 60 mL/min/1.15m2 Final   eGFR  Date Value Ref Range Status  03/13/2023 102 > OR = 60 mL/min/1.16m2 Final         Passed - Valid encounter within last 6 months    Recent Outpatient Visits           1 month ago Blepharitis of both upper and lower eyelid of left eye, unspecified type   [place] [facility]  Pickard, Butler DASEN, MD   5 months ago Immunization due   [facility] Duanne Butler DASEN, MD   11 months ago Abdominal wall pain   [place] [facility] Duanne Butler DASEN, MD   1 year ago Moderate episode of recurrent major depressive disorder [facility])   [place] [facility] Duanne Butler DASEN, MD   1 year ago Uncontrolled type 2 diabetes mellitus with hyperglycemia [facility])   [place] [facility] Pickard, Butler DASEN, MD

## 2023-09-10 ENCOUNTER — Other Ambulatory Visit: Payer: Self-pay

## 2023-09-10 ENCOUNTER — Encounter: Payer: Self-pay | Admitting: Family Medicine

## 2023-09-10 DIAGNOSIS — E119 Type 2 diabetes mellitus without complications: Secondary | ICD-10-CM

## 2023-09-10 NOTE — Telephone Encounter (Signed)
 Pt. Called, scheduled for A1C check for next Monday, also placed order in system for when he arrives.

## 2023-09-17 ENCOUNTER — Other Ambulatory Visit

## 2023-09-17 DIAGNOSIS — E119 Type 2 diabetes mellitus without complications: Secondary | ICD-10-CM

## 2023-09-18 ENCOUNTER — Other Ambulatory Visit: Payer: Self-pay

## 2023-09-18 ENCOUNTER — Ambulatory Visit: Payer: Self-pay | Admitting: Family Medicine

## 2023-09-18 LAB — HEMOGLOBIN A1C
Hgb A1c MFr Bld: 7.6 % — ABNORMAL HIGH (ref <5.7)
Mean Plasma Glucose: 171 mg/dL
eAG (mmol/L): 9.5 mmol/L

## 2023-09-18 MED ORDER — LANTUS SOLOSTAR 100 UNIT/ML ~~LOC~~ SOPN: 10.0000 [IU] | PEN_INJECTOR | Freq: Every day | SUBCUTANEOUS | 99 refills | Status: AC

## 2023-09-18 NOTE — Progress Notes (Signed)
 Pt. Informed and stated he understood the new dose of lantus .   Old dose of 15 ml has been discontinued and new order for 10 ml  has been created.  New order  has been pended, sent to your in box awaiting cosign for  approval ,for correct dosage and signature before prescription can be filled.   Thank you.

## 2023-11-22 ENCOUNTER — Encounter: Payer: Self-pay | Admitting: Family Medicine

## 2023-11-22 ENCOUNTER — Ambulatory Visit: Admitting: Family Medicine

## 2023-11-22 VITALS — BP 114/76 | HR 68 | Temp 97.4°F | Ht 71.0 in | Wt 189.8 lb

## 2023-11-22 DIAGNOSIS — M25519 Pain in unspecified shoulder: Secondary | ICD-10-CM | POA: Insufficient documentation

## 2023-11-22 DIAGNOSIS — K279 Peptic ulcer, site unspecified, unspecified as acute or chronic, without hemorrhage or perforation: Secondary | ICD-10-CM | POA: Insufficient documentation

## 2023-11-22 DIAGNOSIS — H9313 Tinnitus, bilateral: Secondary | ICD-10-CM | POA: Diagnosis not present

## 2023-11-22 DIAGNOSIS — M7590 Shoulder lesion, unspecified, unspecified shoulder: Secondary | ICD-10-CM | POA: Insufficient documentation

## 2023-11-22 DIAGNOSIS — M7542 Impingement syndrome of left shoulder: Secondary | ICD-10-CM

## 2023-11-22 NOTE — Progress Notes (Signed)
 Acute Office Visit  Subjective:     Patient ID: Mike Henderson, male    DOB: 08/24/1965, 58 y.o.   MRN: [id number]  Chief Complaint  Patient presents with   Tinnitus    Ringing in Left ear x 9 months.    Shoulder Pain    Pt c/o issues with R shoulder pain, would like injection.     Shoulder Pain    Patient has a history of chronic left shoulder pain.  He has a history of a rotator cuff tear in his left shoulder.  His last cortisone injection in his left shoulder was almost a year ago.  He is having pain in his left shoulder and he is requesting a cortisone injection.  He also presents with tinnitus.  He states that he received transcranial magnetic therapy for depression.  Shortly after this, he developed worsening tinnitus.  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  He denies any ear pain.  He denies any sinus pain.  On examination he does have a slight middle ear effusion and congestion in both nostrils along with some mild swelling of both turbinates.  Some of this potentially could be due to eustachian tube dysfunction.  ROS      Objective:    BP 114/76   Pulse 68   Temp (!) 97.4 F (36.3 C)   Ht 5' 11 (1.803 m)   Wt 189 lb 12.8 oz (86.1 kg)   SpO2 99%   BMI 26.47 kg/m    Physical Exam Constitutional:      Appearance: Normal appearance. He is normal weight.  HENT:     Right Ear: External ear normal. No drainage or swelling. A middle ear effusion is present. There is no impacted cerumen. Tympanic membrane is not injected, scarred, perforated or erythematous.     Left Ear: External ear normal. No drainage or swelling.  No middle ear effusion. There is no impacted cerumen. Tympanic membrane is not injected, scarred, perforated or erythematous.     Nose: Congestion and rhinorrhea present.  Cardiovascular:     Rate and Rhythm: Normal rate and regular rhythm.     Heart sounds: Normal heart sounds. No  murmur heard.    No friction rub. No gallop.  Pulmonary:     Effort: Pulmonary effort is normal. No respiratory distress.     Breath sounds: Normal breath sounds. No wheezing, rhonchi or rales.  Musculoskeletal:     Left shoulder: Tenderness present. Decreased range of motion. Decreased strength.     Right lower leg: No edema.     Left lower leg: No edema.  Neurological:     Mental Status: He is alert.         Assessment & Plan:   Impingement syndrome of left shoulder  Tinnitus of both ears I believe the tinnitus is likely related to hearing loss/sensorineural hearing loss.  I recommended audiology consultation.  Patient would like to pursue this through the [place] as this may be a service related injury.  Also recommended trying Flonase and Xyzal for possible eustachian tube dysfunction as this may be contributing especially in his right ear.  He has impingement syndrome of the left shoulder.  Using sterile technique, I injected the left subacromial space with 2 cc of lidocaine, 2 cc of Marcaine, and 2 cc of 40 mg/mL Kenalog.  No orders of the defined types were placed in this encounter.  No follow-ups on file.  Butler ONEIDA Burr, MD

## 2023-11-23 ENCOUNTER — Other Ambulatory Visit: Payer: Self-pay | Admitting: Family Medicine

## 2023-11-23 DIAGNOSIS — E119 Type 2 diabetes mellitus without complications: Secondary | ICD-10-CM

## 2023-11-28 DIAGNOSIS — M7542 Impingement syndrome of left shoulder: Secondary | ICD-10-CM

## 2023-11-28 MED ORDER — TRIAMCINOLONE ACETONIDE 40 MG/ML IJ SUSP
80.0000 mg | Freq: Once | INTRAMUSCULAR | Status: AC
  Administered 2023-11-28: 80 mg via INTRA_ARTICULAR

## 2023-11-28 NOTE — Addendum Note (Signed)
 Addended by: ANGELENA RONAL BRADLEY K on: 11/28/2023 03:07 PM   Modules accepted: Orders

## 2023-12-13 LAB — OPHTHALMOLOGY REPORT-SCANNED

## 2023-12-17 ENCOUNTER — Other Ambulatory Visit: Payer: Self-pay | Admitting: Family Medicine

## 2023-12-19 NOTE — Telephone Encounter (Signed)
 Requested Prescriptions  Pending Prescriptions Disp Refills   lansoprazole  (PREVACID ) 30 MG capsule [Pharmacy Med Name: LANSOPRAZOLE  DR CAPS 30MG ] 90 capsule 0    Sig: TAKE 1 CAPSULE DAILY BEFORE A MEAL     Gastroenterology: Proton Pump Inhibitors 2 Passed - 12/19/2023  9:27 AM      Passed - ALT in normal range and within 360 days    ALT  Date Value Ref Range Status  03/13/2023 16 9 - 46 U/L Final         Passed - AST in normal range and within 360 days    AST  Date Value Ref Range Status  03/13/2023 12 10 - 35 U/L Final         Passed - Valid encounter within last 12 months    Recent Outpatient Visits           3 weeks ago Impingement syndrome of left shoulder   [facility] Duanne Butler DASEN, MD   5 months ago Blepharitis of both upper and lower eyelid of left eye, unspecified type   [place] [facility] Duanne Butler DASEN, MD   9 months ago Immunization due   [place] [facility] Pickard, Butler DASEN, MD   1 year ago Abdominal wall pain   [place] [facility] Duanne Butler DASEN, MD   1 year ago Moderate episode of recurrent major depressive disorder [facility])    [facility] Pickard, Butler DASEN, MD

## 2023-12-24 ENCOUNTER — Encounter: Payer: Self-pay | Admitting: Family Medicine

## 2024-01-19 ENCOUNTER — Other Ambulatory Visit: Payer: Self-pay | Admitting: Family Medicine

## 2024-01-19 DIAGNOSIS — E039 Hypothyroidism, unspecified: Secondary | ICD-10-CM

## 2024-02-14 ENCOUNTER — Other Ambulatory Visit: Payer: Self-pay | Admitting: Family Medicine

## 2024-02-14 DIAGNOSIS — E785 Hyperlipidemia, unspecified: Secondary | ICD-10-CM

## 2024-02-14 DIAGNOSIS — E119 Type 2 diabetes mellitus without complications: Secondary | ICD-10-CM

## 2024-02-15 NOTE — Telephone Encounter (Signed)
 Courtesy refill. Patient will need an office visit for additional refills.  Requested Prescriptions  Pending Prescriptions Disp Refills   metFORMIN  (GLUCOPHAGE ) 500 MG tablet [Pharmacy Med Name: METFORMIN  HCL TABS 500MG ] 120 tablet 0    Sig: TAKE 2 TABLETS TWICE A DAY WITH MEALS     Endocrinology:  Diabetes - Biguanides Passed - 02/15/2024  9:58 AM      Passed - Cr in normal range and within 360 days    Creat  Date Value Ref Range Status  03/13/2023 0.81 0.70 - 1.30 mg/dL Final   Creatinine, Urine  Date Value Ref Range Status  08/15/2022 95 20 - 320 mg/dL Final         Passed - HBA1C is between 0 and 7.9 and within 180 days    Hgb A1c MFr Bld  Date Value Ref Range Status  09/17/2023 7.6 (H) <5.7 % Final    Comment:    For someone without known diabetes, a hemoglobin A1c value of 6.5% or greater indicates that they may have  diabetes and this should be confirmed with a follow-up  test. . For someone with known diabetes, a value <7% indicates  that their diabetes is well controlled and a value  greater than or equal to 7% indicates suboptimal  control. A1c targets should be individualized based on  duration of diabetes, age, comorbid conditions, and  other considerations. . Currently, no consensus exists regarding use of hemoglobin A1c for diagnosis of diabetes for children. .          Passed - eGFR in normal range and within 360 days    GFR, Est African American  Date Value Ref Range Status  06/23/2020 124 > OR = 60 mL/min/1.1m2 Final   GFR, Est Non African American  Date Value Ref Range Status  06/23/2020 107 > OR = 60 mL/min/1.82m2 Final   eGFR  Date Value Ref Range Status  03/13/2023 102 > OR = 60 mL/min/1.72m2 Final         Passed - B12 Level in normal range and within 720 days    Vitamin B-12  Date Value Ref Range Status  08/15/2022 277 200 - 1,100 pg/mL Final    Comment:    . Please Note: Although the reference range for vitamin B12 is [phone]  pg/mL, it has been reported that between 5 and 10% of patients with values between 200 and 400 pg/mL may experience neuropsychiatric and hematologic abnormalities due to occult B12 deficiency; less than 1% of patients with values above 400 pg/mL will have symptoms. SABRA Amy - Valid encounter within last 6 months    Recent Outpatient Visits           2 months ago Impingement syndrome of left shoulder   [place] [facility] Duanne Butler DASEN, MD   7 months ago Blepharitis of both upper and lower eyelid of left eye, unspecified type   [facility] Duanne Butler DASEN, MD   11 months ago Immunization due   [place] [facility] Pickard, Butler DASEN, MD   1 year ago Abdominal wall pain   [place] [facility] Duanne Butler DASEN, MD   1 year ago Moderate episode of recurrent major depressive disorder [facility])   [place] [facility] Pickard, Butler DASEN, MD              Passed -  CBC within normal limits and completed in the last 12 months    WBC  Date Value Ref Range Status  03/13/2023 6.8 3.8 - 10.8 Thousand/uL Final   RBC  Date Value Ref Range Status  03/13/2023 4.88 4.20 - 5.80 Million/uL Final   Hemoglobin  Date Value Ref Range Status  03/13/2023 15.2 13.2 - 17.1 g/dL Final   HCT  Date Value Ref Range Status  03/13/2023 44.8 38.5 - 50.0 % Final   MCHC  Date Value Ref Range Status  03/13/2023 33.9 32.0 - 36.0 g/dL Final    Comment:    For adults, a slight decrease in the calculated MCHC value (in the range of 30 to 32 g/dL) is most likely not clinically significant; however, it should be interpreted with caution in correlation with other red cell parameters and the patient's clinical condition.    [facility]  Date Value Ref Range Status  03/13/2023 31.1 27.0 - 33.0 pg Final   MCV  Date Value Ref Range Status  03/13/2023 91.8 80.0 - 100.0 fL Final   No results  found for: PLTCOUNTKUC, LABPLAT, POCPLA RDW  Date Value Ref Range Status  03/13/2023 12.0 11.0 - 15.0 % Final

## 2024-02-19 ENCOUNTER — Telehealth: Payer: Self-pay

## 2024-02-19 ENCOUNTER — Other Ambulatory Visit: Payer: Self-pay

## 2024-02-19 ENCOUNTER — Encounter: Payer: Self-pay | Admitting: Family Medicine

## 2024-02-19 DIAGNOSIS — E785 Hyperlipidemia, unspecified: Secondary | ICD-10-CM

## 2024-02-19 DIAGNOSIS — E119 Type 2 diabetes mellitus without complications: Secondary | ICD-10-CM

## 2024-02-19 NOTE — Telephone Encounter (Signed)
 Copied from CRM [id number]. Topic: Clinical - Request for Lab/Test Order >> Feb 19, 2024 12:05 PM Mike Henderson wrote: Reason for CRM: The patient has called to request orders for labs prior to their physical on 03/31/24. The patient has stressed their need for lab orders testing for anything related to their diabetes. Please contact further if needed

## 2024-02-27 ENCOUNTER — Other Ambulatory Visit: Payer: Self-pay | Admitting: Family Medicine

## 2024-03-06 ENCOUNTER — Other Ambulatory Visit: Payer: Self-pay | Admitting: Family Medicine

## 2024-03-31 ENCOUNTER — Encounter: Admitting: Family Medicine
